# Patient Record
Sex: Male | Born: 1935 | Race: White | Hispanic: No | Smoking: Former smoker
Health system: Southern US, Community
[De-identification: ages and names within clinical notes are randomized; demographics above are authoritative.]

## PROBLEM LIST (undated history)

## (undated) DIAGNOSIS — K635 Polyp of colon: Secondary | ICD-10-CM

## (undated) DIAGNOSIS — K5520 Angiodysplasia of colon without hemorrhage: Secondary | ICD-10-CM

## (undated) DIAGNOSIS — I10 Essential (primary) hypertension: Secondary | ICD-10-CM

## (undated) DIAGNOSIS — M1990 Unspecified osteoarthritis, unspecified site: Secondary | ICD-10-CM

## (undated) DIAGNOSIS — E669 Obesity, unspecified: Secondary | ICD-10-CM

## (undated) DIAGNOSIS — G4730 Sleep apnea, unspecified: Secondary | ICD-10-CM

## (undated) DIAGNOSIS — G629 Polyneuropathy, unspecified: Secondary | ICD-10-CM

## (undated) DIAGNOSIS — H353290 Exudative age-related macular degeneration, unspecified eye, stage unspecified: Secondary | ICD-10-CM

## (undated) DIAGNOSIS — N400 Enlarged prostate without lower urinary tract symptoms: Secondary | ICD-10-CM

## (undated) DIAGNOSIS — K5730 Diverticulosis of large intestine without perforation or abscess without bleeding: Secondary | ICD-10-CM

## (undated) HISTORY — DX: Polyneuropathy, unspecified: G62.9

## (undated) HISTORY — PX: MASTOID DEBRIDEMENT: SHX2002

## (undated) HISTORY — DX: Obesity, unspecified: E66.9

## (undated) HISTORY — PX: APPENDECTOMY: SHX54

## (undated) HISTORY — DX: Benign prostatic hyperplasia without lower urinary tract symptoms: N40.0

## (undated) HISTORY — DX: Diverticulosis of large intestine without perforation or abscess without bleeding: K57.30

## (undated) HISTORY — DX: Polyp of colon: K63.5

## (undated) HISTORY — PX: TONSILLECTOMY: SUR1361

## (undated) HISTORY — DX: Angiodysplasia of colon without hemorrhage: K55.20

## (undated) HISTORY — DX: Essential (primary) hypertension: I10

## (undated) HISTORY — DX: Exudative age-related macular degeneration, unspecified eye, stage unspecified: H35.3290

## (undated) HISTORY — PX: KNEE ARTHROSCOPY: SUR90

---

## 1958-05-16 HISTORY — PX: PILONIDAL CYST EXCISION: SHX744

## 2002-07-26 ENCOUNTER — Ambulatory Visit (HOSPITAL_BASED_OUTPATIENT_CLINIC_OR_DEPARTMENT_OTHER): Admission: RE | Admit: 2002-07-26 | Discharge: 2002-07-26 | Payer: Self-pay | Admitting: Pulmonary Disease

## 2004-12-29 ENCOUNTER — Ambulatory Visit: Payer: Self-pay | Admitting: Internal Medicine

## 2005-03-07 ENCOUNTER — Ambulatory Visit: Payer: Self-pay | Admitting: Internal Medicine

## 2005-04-18 ENCOUNTER — Ambulatory Visit: Payer: Self-pay | Admitting: Internal Medicine

## 2005-05-02 ENCOUNTER — Ambulatory Visit (HOSPITAL_COMMUNITY): Admission: RE | Admit: 2005-05-02 | Discharge: 2005-05-02 | Payer: Self-pay | Admitting: Internal Medicine

## 2005-05-05 ENCOUNTER — Encounter: Payer: Self-pay | Admitting: Internal Medicine

## 2005-05-05 ENCOUNTER — Ambulatory Visit: Payer: Self-pay | Admitting: Internal Medicine

## 2005-12-29 ENCOUNTER — Ambulatory Visit: Payer: Self-pay | Admitting: Internal Medicine

## 2006-02-13 DIAGNOSIS — K5520 Angiodysplasia of colon without hemorrhage: Secondary | ICD-10-CM

## 2006-02-13 HISTORY — DX: Angiodysplasia of colon without hemorrhage: K55.20

## 2006-03-01 ENCOUNTER — Ambulatory Visit: Payer: Self-pay | Admitting: Gastroenterology

## 2006-03-15 ENCOUNTER — Ambulatory Visit: Payer: Self-pay | Admitting: Gastroenterology

## 2006-03-15 ENCOUNTER — Encounter (INDEPENDENT_AMBULATORY_CARE_PROVIDER_SITE_OTHER): Payer: Self-pay | Admitting: *Deleted

## 2006-06-27 ENCOUNTER — Ambulatory Visit: Payer: Self-pay | Admitting: Internal Medicine

## 2006-06-27 LAB — CONVERTED CEMR LAB
ALT: 29 units/L (ref 0–40)
AST: 28 units/L (ref 0–37)
Albumin: 3.7 g/dL (ref 3.5–5.2)
BUN: 13 mg/dL (ref 6–23)
Basophils Absolute: 0 10*3/uL (ref 0.0–0.1)
Bilirubin, Direct: 0.1 mg/dL (ref 0.0–0.3)
Calcium: 9.2 mg/dL (ref 8.4–10.5)
Chloride: 101 meq/L (ref 96–112)
Eosinophils Absolute: 0.2 10*3/uL (ref 0.0–0.6)
Eosinophils Relative: 2.5 % (ref 0.0–5.0)
GFR calc Af Amer: 123 mL/min
GFR calc non Af Amer: 102 mL/min
Glucose, Bld: 107 mg/dL — ABNORMAL HIGH (ref 70–99)
Lymphocytes Relative: 30.4 % (ref 12.0–46.0)
MCHC: 35.1 g/dL (ref 30.0–36.0)
MCV: 96.8 fL (ref 78.0–100.0)
Monocytes Relative: 9.8 % (ref 3.0–11.0)
Neutro Abs: 3.7 10*3/uL (ref 1.4–7.7)
Platelets: 182 10*3/uL (ref 150–400)
RBC: 4.53 M/uL (ref 4.22–5.81)
Vitamin B-12: 318 pg/mL (ref 211–911)
WBC: 6.4 10*3/uL (ref 4.5–10.5)

## 2006-12-13 DIAGNOSIS — K5730 Diverticulosis of large intestine without perforation or abscess without bleeding: Secondary | ICD-10-CM | POA: Insufficient documentation

## 2006-12-13 DIAGNOSIS — I10 Essential (primary) hypertension: Secondary | ICD-10-CM

## 2006-12-13 DIAGNOSIS — K5520 Angiodysplasia of colon without hemorrhage: Secondary | ICD-10-CM | POA: Insufficient documentation

## 2006-12-26 ENCOUNTER — Ambulatory Visit: Payer: Self-pay | Admitting: Internal Medicine

## 2006-12-27 LAB — CONVERTED CEMR LAB
Albumin: 3.9 g/dL (ref 3.5–5.2)
BUN: 13 mg/dL (ref 6–23)
CO2: 31 meq/L (ref 19–32)
Calcium: 9.2 mg/dL (ref 8.4–10.5)
Creatinine, Ser: 0.9 mg/dL (ref 0.4–1.5)
GFR calc non Af Amer: 89 mL/min
PSA: 0.99 ng/mL (ref 0.10–4.00)
Phosphorus: 2.6 mg/dL (ref 2.3–4.6)
Potassium: 4.3 meq/L (ref 3.5–5.1)

## 2007-05-28 ENCOUNTER — Encounter: Payer: Self-pay | Admitting: Internal Medicine

## 2007-06-22 ENCOUNTER — Ambulatory Visit: Payer: Self-pay | Admitting: Internal Medicine

## 2007-12-24 ENCOUNTER — Ambulatory Visit: Payer: Self-pay | Admitting: Internal Medicine

## 2007-12-24 DIAGNOSIS — N138 Other obstructive and reflux uropathy: Secondary | ICD-10-CM

## 2007-12-24 DIAGNOSIS — N401 Enlarged prostate with lower urinary tract symptoms: Secondary | ICD-10-CM

## 2007-12-25 LAB — CONVERTED CEMR LAB
ALT: 29 units/L (ref 0–53)
AST: 25 units/L (ref 0–37)
Alkaline Phosphatase: 50 units/L (ref 39–117)
BUN: 16 mg/dL (ref 6–23)
Bilirubin, Direct: 0.1 mg/dL (ref 0.0–0.3)
Calcium: 9.2 mg/dL (ref 8.4–10.5)
Creatinine, Ser: 0.9 mg/dL (ref 0.4–1.5)
Eosinophils Relative: 3.4 % (ref 0.0–5.0)
Glucose, Bld: 106 mg/dL — ABNORMAL HIGH (ref 70–99)
Lymphocytes Relative: 29.6 % (ref 12.0–46.0)
Monocytes Relative: 11.1 % (ref 3.0–12.0)
Phosphorus: 2.8 mg/dL (ref 2.3–4.6)
Platelets: 173 10*3/uL (ref 150–400)
RDW: 12.5 % (ref 11.5–14.6)
TSH: 2.56 microintl units/mL (ref 0.35–5.50)
Total Bilirubin: 0.8 mg/dL (ref 0.3–1.2)
Total Protein: 6.5 g/dL (ref 6.0–8.3)
WBC: 8 10*3/uL (ref 4.5–10.5)

## 2008-07-02 ENCOUNTER — Ambulatory Visit: Payer: Self-pay | Admitting: Internal Medicine

## 2008-07-07 LAB — CONVERTED CEMR LAB
BUN: 14 mg/dL (ref 6–23)
CO2: 33 meq/L — ABNORMAL HIGH (ref 19–32)
Chloride: 99 meq/L (ref 96–112)
Creatinine, Ser: 0.9 mg/dL (ref 0.4–1.5)
GFR calc Af Amer: 107 mL/min
Glucose, Bld: 104 mg/dL — ABNORMAL HIGH (ref 70–99)

## 2008-10-08 ENCOUNTER — Ambulatory Visit: Payer: Self-pay | Admitting: Family Medicine

## 2008-10-15 ENCOUNTER — Ambulatory Visit: Payer: Self-pay | Admitting: Internal Medicine

## 2009-01-23 ENCOUNTER — Ambulatory Visit: Payer: Self-pay | Admitting: Internal Medicine

## 2009-07-28 ENCOUNTER — Ambulatory Visit: Payer: Self-pay | Admitting: Internal Medicine

## 2009-07-29 LAB — CONVERTED CEMR LAB
ALT: 23 units/L (ref 0–53)
Alkaline Phosphatase: 56 units/L (ref 39–117)
Basophils Relative: 0.1 % (ref 0.0–3.0)
Bilirubin, Direct: 0.1 mg/dL (ref 0.0–0.3)
Calcium: 9.3 mg/dL (ref 8.4–10.5)
Chloride: 104 meq/L (ref 96–112)
Eosinophils Relative: 3.5 % (ref 0.0–5.0)
Glucose, Bld: 112 mg/dL — ABNORMAL HIGH (ref 70–99)
Lymphocytes Relative: 30.9 % (ref 12.0–46.0)
MCV: 101.3 fL — ABNORMAL HIGH (ref 78.0–100.0)
Neutrophils Relative %: 56 % (ref 43.0–77.0)
Potassium: 4.2 meq/L (ref 3.5–5.1)
RBC: 4.51 M/uL (ref 4.22–5.81)
Sodium: 144 meq/L (ref 135–145)
Total Protein: 6.4 g/dL (ref 6.0–8.3)
WBC: 7.1 10*3/uL (ref 4.5–10.5)

## 2009-07-31 ENCOUNTER — Encounter: Payer: Self-pay | Admitting: Internal Medicine

## 2009-08-10 ENCOUNTER — Ambulatory Visit: Payer: Self-pay | Admitting: Internal Medicine

## 2009-08-10 LAB — CONVERTED CEMR LAB
Nitrite: NEGATIVE
Urobilinogen, UA: 0.2

## 2010-02-02 ENCOUNTER — Ambulatory Visit: Payer: Self-pay | Admitting: Internal Medicine

## 2010-03-25 ENCOUNTER — Telehealth: Payer: Self-pay | Admitting: Internal Medicine

## 2010-04-01 ENCOUNTER — Ambulatory Visit: Payer: Self-pay | Admitting: Internal Medicine

## 2010-04-01 ENCOUNTER — Encounter: Payer: Self-pay | Admitting: Internal Medicine

## 2010-05-19 ENCOUNTER — Encounter: Payer: Self-pay | Admitting: Internal Medicine

## 2010-06-15 NOTE — Letter (Signed)
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

## 2010-06-15 NOTE — Progress Notes (Signed)
Summary: Want rx  Phone Note Call from Patient Call back at Home Phone [phone]   Caller: Patient Call For: Cindee Salt MD Summary of Call: pt states he has neuropathy in his feet and he was taking his wife's ibuprofen 800mg  and it helped, pt would like rx for this sent to [facility] in [place].  Initial call taken by: Mervin Hack CMA Duncan Dull),  March 25, 2010 11:14 AM  Follow-up for Phone Call        okay to send Rx for ibuprofen 800mg  three times a day as needed  #100 x 3  he can take 4 of the OTC 200mg  if he wants also Follow-up by: Cindee Salt MD,  March 25, 2010 1:33 PM  Additional Follow-up for Phone Call Additional follow up Details #1::        Called patient and left message on machine that rx was called in. Additional Follow-up by: Mervin Hack CMA Duncan Dull),  March 25, 2010 4:31 PM    New/Updated Medications: IBUPROFEN 800 MG TABS (IBUPROFEN) take 1 by mouth three times a day as needed Prescriptions: IBUPROFEN 800 MG TABS (IBUPROFEN) take 1 by mouth three times a day as needed  #100 x 3   Entered by:   Mervin Hack CMA (AAMA)   Authorized by:   Cindee Salt MD   Signed by:   Mervin Hack CMA (AAMA) on 03/25/2010   Method used:   Electronically to        [facility] [address] (retail)       [address] [place], [place]  [address]       Ph: [phone]       Fax: [phone]   RxID:   [id number]

## 2010-06-15 NOTE — Assessment & Plan Note (Signed)
Summary: pain in kidney/alc   Vital Signs:  Patient profile:   76 year old male Weight:      346 pounds Temp:     97.5 degrees F oral BP sitting:   130 / 90  (left arm) Cuff size:   large  Vitals Entered By: Mervin Hack CMA Duncan Dull) (August 10, 2009 11:51 AM) CC: pain in kidney   History of Present Illness: Noted some pain along left flank a week ago under the rib cage constant ache Some worsening with movement--still able to play golf  No known injury or lifting tasks  No fever No change in voiding patterns has had nocturia x 2 which had been rare No dysuria--till slight pain this AM in the tip of his penis No hematuria  No scrotal pain or swelling  Allergies: No Known Drug Allergies  Past History:  Past medical, surgical, family and social histories (including risk factors) reviewed for relevance to current acute and chronic problems.  Past Medical History: Reviewed history from 01/23/2009 and no changes required. Colonic polyps, hx of Diverticulosis, colon Hypertension Angiodysplasia--10/07 Neuropathy Benign prostatic hypertrophy Obesity  Past Surgical History: Reviewed history from 12/13/2006 and no changes required. Mastoiditis- child Appendectomy- child Tonsillectomy- child Pilonidal cyst  ~1960 Arthroscopy both knees  Family History: Reviewed history from 06/22/2007 and no changes required. Father: Died at age 40, old age Mother: Alive--possible cancer Siblings: One sister, obese, pacer No CAD ??HTN DM in Mat uncle, sister No prostate or colon cancer  Social History: Reviewed history from 12/24/2007 and no changes required. 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  NAD Neck:   supple, no masses, and no thyromegaly.   Chest Wall:  pain posteriorly around T9-10 Tenderness with palpation along ribs there Lungs:  normal respiratory effort, no intercostal retractions, no accessory muscle use, normal breath sounds, and no dullness.   Heart:  normal rate, regular rhythm, no murmur, and no gallop.   Abdomen:  soft and non-tender.   Genitalia:  no scrotal masses and no cutaneous lesions.   Extremities:  no edema Psych:  normally interactive, good eye contact, not anxious appearing, and not depressed appearing.     Impression & Recommendations:  Problem # 1:  CHEST WALL PAIN, ACUTE (ICD-786.52) Assessment New  around upper back No true CVA tenderness like it was his kidney and urine negative nothing to suggest a pleural process Abd bengin  likely is just a chest/back wall process despite no injury ??sprain  disucssed heat and supportive care  Orders: UA Dipstick w/o Micro (manual) (44034)  Complete Medication List: 1)  Klor-con 10 10 Meq Tbcr (Potassium chloride) .... Take one by mouth two times a day 2)  Triamterene-hctz 37.5-25 Mg Caps (Triamterene-hctz) .... Take one by mouth once a day 3)  Avapro 150 Mg Tabs (Irbesartan) .... Take one by mouth once a day 4)  Multivitamins Tabs (Multiple vitamin) .... Take one by mouth once a day 5)  Aspirin 81 Mg Tbec (Aspirin) .... Take one by mouth once a day 6)  Doxazosin Mesylate 4 Mg Tabs (Doxazosin mesylate) .Marland Kitchen.. 1 at bedtime 7)  Folic Acid 1 Mg Tabs (Folic acid) .... Take 1 by mouth once daily  Patient Instructions: 1)  Please schedule  a follow-up appointment as needed  and keep regularly scheduled appt  Current Allergies (reviewed today): No known allergies   Laboratory Results   Urine Tests  Date/Time Received: August 10, 2009 11:57 AM Date/Time Reported: August 10, 2009 11:57 AM  Routine Urinalysis   Color: yellow Appearance: Hazy Glucose: negative   (Normal Range: Negative) Bilirubin: negative    (Normal Range: Negative) Ketone: negative   (Normal Range: Negative) Spec. Gravity: 1.015   (Normal Range: 1.003-1.035) Blood: negative   (Normal Range: Negative) pH: 6.0   (Normal Range: 5.0-8.0) Protein: negative   (Normal Range: Negative) Urobilinogen: 0.2   (Normal Range: 0-1) Nitrite: negative   (Normal Range: Negative) Leukocyte Esterace: negative   (Normal Range: Negative)
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
## 2010-06-15 NOTE — Assessment & Plan Note (Signed)
Summary: 6 M F/U DLO   Vital Signs:  Patient profile:   76 year old male Height:      69 inches Weight:      348.38 pounds BMI:     51.63 Temp:     97.8 degrees F oral Pulse rate:   80 / minute Pulse rhythm:   regular BP sitting:   138 / 96  (left arm) Cuff size:   large  Vitals Entered By: Linde Gillis CMA Duncan Dull) (July 28, 2009 7:58 AM)  Serial Vital Signs/Assessments:  Time      Position  BP       Pulse  Resp  Temp     By           R Arm     140/90                         Cindee Salt MD  CC: FOLLOW UP   History of Present Illness: Doing fairly well Just down in [place] for a week for [employer] spring [employer]  Plans to start gardening Trying to wlak some Neuropathy still troubling--has to be careful on stairs and with balance  No apparent GI bleeding No black stool  Doesn't check BP No chest pain No SOB  Has had urinary urgency Occ loses a couple of drops if he waits too long No sig frequency No nocturia or seldom  Allergies (verified): No Known Drug Allergies  Past History:  Past medical, surgical, family and social histories (including risk factors) reviewed for relevance to current acute and chronic problems.  Past Medical History: Reviewed history from 01/23/2009 and no changes required. Colonic polyps, hx of Diverticulosis, colon Hypertension Angiodysplasia--10/07 Neuropathy Benign prostatic hypertrophy Obesity  Past Surgical History: Reviewed history from 12/13/2006 and no changes required. Mastoiditis- child Appendectomy- child Tonsillectomy- child Pilonidal cyst  ~1960 Arthroscopy both knees  Family History: Reviewed history from 06/22/2007 and no changes required. Father: Died at age 22, old age Mother: Alive--possible cancer Siblings: One sister, obese, pacer No CAD ??HTN DM in Mat uncle, sister No prostate or colon cancer  Social History: Reviewed history from 12/24/2007 and no changes required. 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   Neck:  supple, no masses, no thyromegaly, no carotid bruits, and no cervical lymphadenopathy.   Lungs:  normal respiratory effort and normal breath sounds.   Heart:  normal rate, regular rhythm, no murmur, and no gallop.   Extremities:  thick calves but without pitting Psych:  normally interactive, good eye contact, not anxious appearing, and not depressed appearing.     Impression & Recommendations:  Problem # 1:  HYPERTENSION (ICD-401.9) Assessment Unchanged  Generally okay discussed fitness no changes  His updated medication list for this problem includes:    Triamterene-hctz 37.5-25 Mg Caps (Triamterene-hctz) .Marland Kitchen... Take one by mouth once a day    Avapro 150 Mg Tabs (Irbesartan) .Marland Kitchen... Take one by mouth once a day    Doxazosin Mesylate 4 Mg Tabs (Doxazosin mesylate) .Marland Kitchen... 1 at bedtime  BP today: 138/96 Prior BP: 118/60 (01/23/2009)  Labs Reviewed: K+: 4.5 (07/02/2008) Creat: : 0.9 (07/02/2008)     Orders: TLB-Renal  Function Panel (80069-RENAL) TLB-CBC Platelet - w/Differential (85025-CBCD) TLB-Hepatic/Liver Function Pnl (80076-HEPATIC) TLB-TSH (Thyroid Stimulating Hormone) (84443-TSH) Venipuncture (54098)  Problem # 2:  BENIGN PROSTATIC HYPERTROPHY (ICD-600.00) Assessment: Unchanged voids okay on doxazosin  Problem # 3:  NEUROPATHY (ICD-355.9) Assessment: Unchanged mild  pain not a major issue  Problem # 4:  ANGIODYSPLASIA (JXB-147.82) Assessment: Comment Only no apparent bleeding will check CBC  Complete Medication List: 1)  Klor-con 10 10 Meq Tbcr (Potassium chloride) .... Take one by mouth two times a day 2)  Triamterene-hctz 37.5-25 Mg Caps (Triamterene-hctz)  .... Take one by mouth once a day 3)  Avapro 150 Mg Tabs (Irbesartan) .... Take one by mouth once a day 4)  Multivitamins Tabs (Multiple vitamin) .... Take one by mouth once a day 5)  Aspirin 81 Mg Tbec (Aspirin) .... Take one by mouth once a day 6)  Folic Acid  .... Take one by mouth once a day 7)  Doxazosin Mesylate 4 Mg Tabs (Doxazosin mesylate) .Marland Kitchen.. 1 at bedtime 8)  Bactroban 2 % Oint (Mupirocin) .... Apply to affected area two times a day  Patient Instructions: 1)  Please schedule a follow-up appointment in 6 months .   Current Allergies (reviewed today): No known allergies
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
## 2010-06-16 ENCOUNTER — Encounter: Payer: Self-pay | Admitting: Internal Medicine

## 2010-06-17 NOTE — Letter (Signed)
Summary: [facility] / MEDICAL RECORDS RELEASE FORM  [facility] / MEDICAL RECORDS RELEASE FORM   Imported By: Carin Primrose 05/19/2010 15:58:57  _____________________________________________________________________  External Attachment:    Type:   Image     Comment:   External Document

## 2010-08-03 ENCOUNTER — Encounter: Payer: Self-pay | Admitting: Internal Medicine

## 2010-08-03 ENCOUNTER — Telehealth: Payer: Self-pay | Admitting: *Deleted

## 2010-08-03 ENCOUNTER — Ambulatory Visit (INDEPENDENT_AMBULATORY_CARE_PROVIDER_SITE_OTHER): Payer: Medicare Other | Admitting: Internal Medicine

## 2010-08-03 VITALS — BP 130/80 | HR 86 | Temp 97.7°F | Wt 312.0 lb

## 2010-08-03 DIAGNOSIS — G589 Mononeuropathy, unspecified: Secondary | ICD-10-CM

## 2010-08-03 DIAGNOSIS — N400 Enlarged prostate without lower urinary tract symptoms: Secondary | ICD-10-CM

## 2010-08-03 DIAGNOSIS — E669 Obesity, unspecified: Secondary | ICD-10-CM

## 2010-08-03 DIAGNOSIS — I10 Essential (primary) hypertension: Secondary | ICD-10-CM

## 2010-08-03 LAB — HEPATIC FUNCTION PANEL
ALT: 45 U/L (ref 0–53)
Alkaline Phosphatase: 72 U/L (ref 39–117)
Bilirubin, Direct: 0.2 mg/dL (ref 0.0–0.3)
Total Protein: 6.3 g/dL (ref 6.0–8.3)

## 2010-08-03 LAB — BASIC METABOLIC PANEL
BUN: 14 mg/dL (ref 6–23)
Chloride: 104 mEq/L (ref 96–112)
Creatinine, Ser: 0.9 mg/dL (ref 0.4–1.5)
GFR: 85.53 mL/min (ref >60.00)

## 2010-08-03 NOTE — Assessment & Plan Note (Signed)
Voiding better since weight loss Continues on doxazosin Has to be careful with alcohol in evening

## 2010-08-03 NOTE — Assessment & Plan Note (Signed)
Pain is not an issue Ongoing sensory problems but no Rx needed

## 2010-08-03 NOTE — Progress Notes (Signed)
Addended byMills Koller on: 08/03/2010 10:41 AM   Modules accepted: Orders

## 2010-08-03 NOTE — Telephone Encounter (Signed)
Called patient and left message with results, advised pt to call if any questions.

## 2010-08-03 NOTE — Assessment & Plan Note (Signed)
Much improved with the specialized [facility] program Hopes to continue lifestyle changes

## 2010-08-03 NOTE — Progress Notes (Signed)
Subjective:    Patient ID: Brad Reed, male    DOB: [birth year], 76 y.o.   MRN: [id number]  Hypertension This is a chronic problem. The current episode started more than 1 year ago. The problem has been gradually improving since onset. The problem is controlled. Pertinent negatives include no chest pain, headaches, palpitations, peripheral edema or shortness of breath. There are no associated agents to hypertension. Risk factors for coronary artery disease include obesity. Past treatments include angiotensin blockers. The current treatment provides mild improvement. Compliance problems include exercise.  There is no history of angina, kidney disease, CAD/MI, CVA or heart failure.  Benign Prostatic Hypertrophy This is a chronic problem. The current episode started more than 1 year ago. The problem has been gradually improving since onset. Irritative symptoms include nocturia. Irritative symptoms do not include frequency or urgency. Obstructive symptoms do not include a weak stream. Pertinent negatives include no hematuria. Past treatments include doxazosin. The treatment provided mild relief. He has been using treatment for 2 or more years.   Went to expensive month long [facility] weight loss program. Has lost about 40#. Now moderating eating some--may be up 200-300 calories a day from nadir. Careful with salt and trans-fats. Is using internet based calorie counter on his smart phone Never much for exercise but has noted sig improvement in all facets of life. Stamina is better, less problem with voiding  Neuropathy may be slightly worse. Occ jab of pain but not overly severe. More sensory loss. No recent falls though and is not unstable in general.  Past Medical History  Diagnosis Date  . Colon polyps   . Diverticulosis of colon   . Hypertension   . Angiodysplasia 02/13/2006  . Neuropathy   . Benign prostatic hypertrophy   . Obesity   . 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 Mastoid debridement   . Appendectomy   . Tonsillectomy   . Pilonidal cyst excision 1960  . Knee arthroscopy     BOTH KNEES    Family History  Problem Relation Age of Onset  . Cancer Mother   . Obesity Sister   . Diabetes Sister   . Diabetes Maternal Uncle     History   Social History  . Marital Status: Widowed    Spouse Name: N/A    Number of Children: 3  . Years of Education: N/A   Occupational History  . 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 Smoking status: Former Smoker    Types: Cigarettes    Quit date: 05/17/1983  . Smokeless tobacco: Not on file  . Alcohol Use: Yes  . Drug Use: Not on file  . Sexually Active: Not on file   Other Topics Concern  . Not on file   Social History Narrative  . No narrative on file     Review of Systems  HENT: Negative for dental problem.        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   Cardiovascular: Negative for chest pain and palpitations.  Gastrointestinal: Negative for abdominal pain and blood in stool.  Genitourinary: Positive for nocturia. Negative for urgency, frequency and hematuria.  Musculoskeletal: Negative for joint swelling.       Chronic knee pain---seems better also  Neurological: Positive for numbness. Negative for syncope and headaches.   Just spent a month in [place] for spring training Has decided to stick with his house for now     Objective:   Physical Exam  Constitutional: He appears well-developed. No distress.  Neck: Neck supple. No thyromegaly present.  Cardiovascular: Normal rate, regular rhythm and normal heart sounds.   Pulmonary/Chest: Effort normal and breath sounds normal. He has no wheezes. He has no rales.  Abdominal:  Soft. He exhibits no mass. There is no tenderness.  Musculoskeletal: Normal range of motion. He exhibits no edema.       No joint swelling  Lymphadenopathy:    He has no cervical adenopathy.  Skin: No rash noted. No erythema.          Assessment & Plan:

## 2010-08-03 NOTE — Telephone Encounter (Signed)
Message copied by Mervin Hack on Tue Aug 03, 2010  3:48 PM ------      Message from: Tillman Abide      Created: Tue Aug 03, 2010  1:19 PM       Labs look fine      Blood sugar is only borderline elevated at 102--this is the best we have had here in over 4 years      Liver, kidney and thyroid are all normal

## 2010-08-03 NOTE — Assessment & Plan Note (Signed)
BP is better since weight loss Wonders about stopping one of his meds Discussed holding off on that to see how he does with lifestyle measures

## 2010-08-04 ENCOUNTER — Other Ambulatory Visit: Payer: Self-pay | Admitting: Internal Medicine

## 2010-08-04 MED ORDER — IRBESARTAN 150 MG PO TABS: 150.0000 mg | ORAL_TABLET | Freq: Every day | ORAL | Status: DC

## 2010-10-06 ENCOUNTER — Other Ambulatory Visit: Payer: Self-pay | Admitting: *Deleted

## 2010-10-06 MED ORDER — POTASSIUM CHLORIDE 10 MEQ PO TBCR: 10.0000 meq | EXTENDED_RELEASE_TABLET | Freq: Two times a day (BID) | ORAL | Status: DC

## 2010-10-07 ENCOUNTER — Other Ambulatory Visit: Payer: Self-pay | Admitting: *Deleted

## 2010-10-07 MED ORDER — DOXAZOSIN MESYLATE 4 MG PO TABS: 4.0000 mg | ORAL_TABLET | Freq: Every day | ORAL | Status: DC

## 2011-01-21 ENCOUNTER — Other Ambulatory Visit: Payer: Self-pay | Admitting: *Deleted

## 2011-01-21 ENCOUNTER — Other Ambulatory Visit: Payer: Self-pay | Admitting: Internal Medicine

## 2011-01-21 MED ORDER — IRBESARTAN 150 MG PO TABS: 150.0000 mg | ORAL_TABLET | Freq: Every day | ORAL | Status: DC

## 2011-02-04 ENCOUNTER — Ambulatory Visit: Payer: PRIVATE HEALTH INSURANCE | Admitting: Internal Medicine

## 2011-02-11 ENCOUNTER — Encounter: Payer: Self-pay | Admitting: Internal Medicine

## 2011-02-11 ENCOUNTER — Ambulatory Visit (INDEPENDENT_AMBULATORY_CARE_PROVIDER_SITE_OTHER): Payer: Medicare Other | Admitting: Internal Medicine

## 2011-02-11 VITALS — BP 111/59 | HR 83 | Temp 98.0°F | Ht 69.0 in | Wt 312.5 lb

## 2011-02-11 DIAGNOSIS — N400 Enlarged prostate without lower urinary tract symptoms: Secondary | ICD-10-CM

## 2011-02-11 DIAGNOSIS — Z23 Encounter for immunization: Secondary | ICD-10-CM

## 2011-02-11 DIAGNOSIS — G589 Mononeuropathy, unspecified: Secondary | ICD-10-CM

## 2011-02-11 DIAGNOSIS — E669 Obesity, unspecified: Secondary | ICD-10-CM

## 2011-02-11 DIAGNOSIS — I10 Essential (primary) hypertension: Secondary | ICD-10-CM

## 2011-02-11 NOTE — Assessment & Plan Note (Signed)
Still with sig numbness but not much pain No Rx needed

## 2011-02-11 NOTE — Assessment & Plan Note (Signed)
BP Readings from Last 3 Encounters:  02/11/11 111/59  08/03/10 130/80  04/01/10 128/80   Has been consistently low Some orthostasis Will stop the avapro

## 2011-02-11 NOTE — Assessment & Plan Note (Signed)
Voids okay Doxazosin may be keeping BP low as well---will continue

## 2011-02-11 NOTE — Progress Notes (Signed)
Subjective:    Patient ID: Brad Reed, male    DOB: September 04, 1934, 76 y.o.   MRN: [id number]  HPI Doing great Has kept all his weight off from intensive program Now able to play more golf Does note some lightheadness--esp when bending down to pick up ball (in the heat) BP at home 115-188/65-70 Stopped the BP med for 3 days to see how he would do  No chest pain No SOB No edema anymore No headaches  Voids fine occ doesn't even have nocturia No sig problems with urgency or freq  Neuropathy is the same Not much pain but sig numbness  Current Outpatient Prescriptions on File Prior to Visit  Medication Sig Dispense Refill  . aspirin 81 MG tablet Take 81 mg by mouth daily.        Marland Kitchen doxazosin (CARDURA) 4 MG tablet Take 1 tablet (4 mg total) by mouth at bedtime.  90 tablet  1  . ibuprofen (ADVIL,MOTRIN) 800 MG tablet Take 800 mg by mouth 3 (three) times daily as needed.        . irbesartan (AVAPRO) 150 MG tablet Take 1 tablet (150 mg total) by mouth at bedtime.  30 tablet  2  . Multiple Vitamin (MULTIVITAMIN) capsule Take 1 capsule by mouth daily.        . potassium chloride (K-DUR) 10 MEQ tablet TAKE ONE TABLET BY MOUTH TWO TIMES A DAY  180 tablet  0  . triamterene-hydrochlorothiazide (DYAZIDE) 37.5-25 MG per capsule Take 1 capsule by mouth every morning.          No Known Allergies  Past Medical History  Diagnosis Date  . Colon polyps   . Diverticulosis of colon   . Hypertension   . Angiodysplasia 02/13/2006  . Neuropathy   . Benign prostatic hypertrophy   . Obesity   . 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 Mastoid debridement   . Appendectomy   . Tonsillectomy   . Pilonidal cyst excision 1960  . Knee arthroscopy     BOTH KNEES    Family History  Problem Relation Age of Onset  . Cancer Mother   . Obesity Sister   . Diabetes Sister   . Diabetes Maternal Uncle     History   Social History  . Marital Status:  Widowed    Spouse Name: N/A    Number of Children: 3  . Years of Education: N/A   Occupational History  . 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 Smoking status: Former Smoker    Types: Cigarettes    Quit date: 05/17/1983  . Smokeless tobacco: Not on file  . Alcohol Use: Yes  . Drug Use: Not on file  . Sexually Active: Not on file   Other Topics Concern  . Not on file   Social History Narrative  . No narrative on file   Review of Systems Careful with eating---appetite is fine Weight stable Sleeps well     Objective:   Physical Exam  Constitutional: He appears well-developed and well-nourished. No distress.  Neck: Normal range of motion. Neck supple. No thyromegaly present.  Cardiovascular: Normal rate, normal heart sounds and intact distal pulses.  Exam reveals no gallop.   No murmur heard. Pulmonary/Chest: Effort normal. No respiratory distress. He has no wheezes. He  has no rales.  Musculoskeletal: He exhibits no edema and no tenderness.  Lymphadenopathy:    He has no cervical adenopathy.  Psychiatric: He has a normal mood and affect. His behavior is normal. Judgment and thought content normal.          Assessment & Plan:

## 2011-02-11 NOTE — Assessment & Plan Note (Signed)
Has kept weight off from intense [facility] program a year ago

## 2011-03-02 ENCOUNTER — Other Ambulatory Visit: Payer: Self-pay | Admitting: *Deleted

## 2011-03-02 MED ORDER — TRIAMTERENE-HCTZ 37.5-25 MG PO CAPS: 1.0000 | ORAL_CAPSULE | ORAL | Status: DC

## 2011-03-25 ENCOUNTER — Encounter: Payer: Self-pay | Admitting: Gastroenterology

## 2011-04-28 ENCOUNTER — Other Ambulatory Visit: Payer: Self-pay | Admitting: Internal Medicine

## 2011-06-13 DIAGNOSIS — H353290 Exudative age-related macular degeneration, unspecified eye, stage unspecified: Secondary | ICD-10-CM | POA: Diagnosis not present

## 2011-07-31 ENCOUNTER — Other Ambulatory Visit: Payer: Self-pay | Admitting: Internal Medicine

## 2011-08-01 DIAGNOSIS — H353290 Exudative age-related macular degeneration, unspecified eye, stage unspecified: Secondary | ICD-10-CM | POA: Diagnosis not present

## 2011-08-10 DIAGNOSIS — B351 Tinea unguium: Secondary | ICD-10-CM | POA: Diagnosis not present

## 2011-08-10 DIAGNOSIS — M79609 Pain in unspecified limb: Secondary | ICD-10-CM | POA: Diagnosis not present

## 2011-08-16 ENCOUNTER — Ambulatory Visit (INDEPENDENT_AMBULATORY_CARE_PROVIDER_SITE_OTHER): Payer: Medicare Other | Admitting: Internal Medicine

## 2011-08-16 ENCOUNTER — Encounter: Payer: Self-pay | Admitting: *Deleted

## 2011-08-16 ENCOUNTER — Encounter: Payer: Self-pay | Admitting: Internal Medicine

## 2011-08-16 VITALS — BP 140/90 | HR 81 | Temp 98.5°F | Ht 68.0 in | Wt 334.0 lb

## 2011-08-16 DIAGNOSIS — B354 Tinea corporis: Secondary | ICD-10-CM

## 2011-08-16 DIAGNOSIS — I10 Essential (primary) hypertension: Secondary | ICD-10-CM | POA: Diagnosis not present

## 2011-08-16 DIAGNOSIS — Z0000 Encounter for general adult medical examination without abnormal findings: Secondary | ICD-10-CM | POA: Insufficient documentation

## 2011-08-16 DIAGNOSIS — H353290 Exudative age-related macular degeneration, unspecified eye, stage unspecified: Secondary | ICD-10-CM | POA: Insufficient documentation

## 2011-08-16 DIAGNOSIS — N400 Enlarged prostate without lower urinary tract symptoms: Secondary | ICD-10-CM

## 2011-08-16 DIAGNOSIS — G589 Mononeuropathy, unspecified: Secondary | ICD-10-CM

## 2011-08-16 LAB — BASIC METABOLIC PANEL
BUN: 17 mg/dL (ref 6–23)
Chloride: 100 mEq/L (ref 96–112)
Potassium: 4.6 mEq/L (ref 3.5–5.1)
Sodium: 141 mEq/L (ref 135–145)

## 2011-08-16 LAB — HEPATIC FUNCTION PANEL
ALT: 23 U/L (ref 0–53)
AST: 26 U/L (ref 0–37)
Albumin: 4 g/dL (ref 3.5–5.2)
Alkaline Phosphatase: 50 U/L (ref 39–117)
Total Protein: 6.7 g/dL (ref 6.0–8.3)

## 2011-08-16 LAB — CBC WITH DIFFERENTIAL/PLATELET
Basophils Relative: 0.5 % (ref 0.0–3.0)
Eosinophils Relative: 2.1 % (ref 0.0–5.0)
HCT: 44.5 % (ref 39.0–52.0)
Hemoglobin: 15 g/dL (ref 13.0–17.0)
Lymphs Abs: 2.3 10*3/uL (ref 0.7–4.0)
MCV: 99.4 fl (ref 78.0–100.0)
Monocytes Absolute: 0.5 10*3/uL (ref 0.1–1.0)
Neutro Abs: 3.8 10*3/uL (ref 1.4–7.7)
Platelets: 150 10*3/uL (ref 150.0–400.0)
WBC: 6.9 10*3/uL (ref 4.5–10.5)

## 2011-08-16 LAB — LIPID PANEL
Cholesterol: 181 mg/dL (ref 0–200)
LDL Cholesterol: 105 mg/dL — ABNORMAL HIGH (ref 0–99)

## 2011-08-16 MED ORDER — KETOCONAZOLE 2 % EX CREA: TOPICAL_CREAM | Freq: Every day | CUTANEOUS | Status: AC

## 2011-08-16 NOTE — Assessment & Plan Note (Signed)
Mostly numbness Usually no problems with balance (unless going down steep stairs)

## 2011-08-16 NOTE — Assessment & Plan Note (Signed)
Unusual at his age but fairly classic Will try ketoconazole topically Fluconazole orally if fails

## 2011-08-16 NOTE — Assessment & Plan Note (Signed)
Voids okay Mild symptoms at present

## 2011-08-16 NOTE — Patient Instructions (Signed)
Please consider the shingles vaccine (zostavax). Call if you would like [place] to send an order to your pharmacy.

## 2011-08-16 NOTE — Assessment & Plan Note (Signed)
BP Readings from Last 3 Encounters:  08/16/11 140/90  02/11/11 111/59  08/03/10 130/80   Acceptable control still Due for labs

## 2011-08-16 NOTE — Assessment & Plan Note (Signed)
I have personally reviewed the Medicare Annual Wellness questionnaire and have noted 1. The patient's medical and social history 2. Their use of alcohol, tobacco or illicit drugs 3. Their current medications and supplements 4. The patient's functional ability including ADL's, fall risks, home safety risks and hearing or visual             impairment. 5. Diet and physical activities 6. Evidence for depression or mood disorders  The patients weight, height, BMI and visual acuity have been recorded in the chart I have made referrals, counseling and provided education to the patient based review of the above and I have provided the pt with a written personalized care plan for preventive services.  I have provided you with a copy of your personalized plan for preventive services. Please take the time to review along with your updated medication list.  See scanned note Will do zostavax if he requests Discussed fitness

## 2011-08-16 NOTE — Progress Notes (Signed)
Subjective:    Patient ID: Brad Reed, male    DOB: 05-13-1935, 77 y.o.   MRN: [id number]  HPI Here for wellness visit Questionnaire reviewed UTD on cancer screening (no PSA) Advanced directives reviewed  No depression or anhedonia No falls and feels stable Not really exercising over the winter---has been out some with improvement in weather  Chronic bad hearing since mastoid operation as child on left Does well except in noisy room No change over the years  Vision is okay Ongoing injection Rx for wet macular degeneration  No chest pain No SOB No palpitations No edema  Still has neuropathy pain in feet--mostly if cold Ongoing numbness L>R Does note some balance issues---like if going down stairs at baseball stadium  Has gained back a lot of his lost weight Working on lifestyle  Voiding okay Nocturia only occ Does have some mild urgency during the day  Current Outpatient Prescriptions on File Prior to Visit  Medication Sig Dispense Refill  . aspirin 81 MG tablet Take 81 mg by mouth daily.        Marland Kitchen doxazosin (CARDURA) 4 MG tablet TAKE 1 TABLET (4 MG TOTAL) BY MOUTH AT BEDTIME.  90 tablet  0  . potassium chloride (K-DUR) 10 MEQ tablet TAKE ONE TABLET BY MOUTH TWO TIMES A DAY  180 tablet  0  . triamterene-hydrochlorothiazide (DYAZIDE) 37.5-25 MG per capsule Take 1 each (1 capsule total) by mouth every morning.  90 capsule  3    No Known Allergies  Past Medical History  Diagnosis Date  . Colon polyps   . Diverticulosis of colon   . Hypertension   . Angiodysplasia 02/13/2006  . Neuropathy   . Benign prostatic hypertrophy   . Obesity   . 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 Macular degeneration, wet     Dr Monte Fantasia    Past Surgical History  Procedure Date  . Mastoid debridement   . Appendectomy   . Tonsillectomy   . Pilonidal cyst excision 1960  . Knee arthroscopy     BOTH KNEES    Family History  Problem Relation Age of Onset  . Cancer  Mother   . Obesity Sister   . Diabetes Sister   . Diabetes Maternal Uncle     History   Social History  . Marital Status: Widowed    Spouse Name: N/A    Number of Children: 3  . Years of Education: N/A   Occupational History  . 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 Smoking status: Former Smoker    Types: Cigarettes    Quit date: 05/17/1983  . Smokeless tobacco: Never Used  . Alcohol Use: Yes  . Drug Use: Not on file  . Sexually Active: Not on file   Other Topics Concern  . Not on file   Social History Narrative   Has living will Son Brad Reed is health care POAWould accept resuscitation but no prolonged artificial life supportNo tube feeds if cognitively unaware   Review of Systems Has ring like rash for 6-7 weeks on right calf---now one on left buttock as well Sleeps well    Objective:   Physical Exam  Constitutional: He is oriented to person, place, and time. He appears well-developed and well-nourished. No distress.  Neck: Normal range of motion. Neck supple. No thyromegaly present.  Cardiovascular: Normal  rate, regular rhythm, normal heart sounds and intact distal pulses.  Exam reveals no gallop.   No murmur heard. Pulmonary/Chest: Effort normal and breath sounds normal. No respiratory distress. He has no wheezes. He has no rales.  Abdominal: Soft. There is no tenderness.  Musculoskeletal: He exhibits no edema and no tenderness.       Thick calves without pitting  Lymphadenopathy:    He has no cervical adenopathy.  Neurological: He is alert and oriented to person, place, and time.       President--- "Brad Reed, Brad Reed" [phone] D-l-r-o-w Recall 3/3  Skin:       Apparent ringworm on leg/buttock  Psychiatric: He has a normal mood and affect. His behavior is normal. Judgment and thought content normal.          Assessment & Plan:

## 2011-09-12 DIAGNOSIS — H353290 Exudative age-related macular degeneration, unspecified eye, stage unspecified: Secondary | ICD-10-CM | POA: Diagnosis not present

## 2011-10-17 DIAGNOSIS — H353290 Exudative age-related macular degeneration, unspecified eye, stage unspecified: Secondary | ICD-10-CM | POA: Diagnosis not present

## 2011-11-03 ENCOUNTER — Other Ambulatory Visit: Payer: Self-pay | Admitting: Internal Medicine

## 2011-11-14 DIAGNOSIS — M79609 Pain in unspecified limb: Secondary | ICD-10-CM | POA: Diagnosis not present

## 2011-11-14 DIAGNOSIS — B351 Tinea unguium: Secondary | ICD-10-CM | POA: Diagnosis not present

## 2011-11-14 DIAGNOSIS — H353290 Exudative age-related macular degeneration, unspecified eye, stage unspecified: Secondary | ICD-10-CM | POA: Diagnosis not present

## 2011-12-15 DIAGNOSIS — H52229 Regular astigmatism, unspecified eye: Secondary | ICD-10-CM | POA: Diagnosis not present

## 2011-12-15 DIAGNOSIS — H5200 Hypermetropia, unspecified eye: Secondary | ICD-10-CM | POA: Diagnosis not present

## 2011-12-15 DIAGNOSIS — H353190 Nonexudative age-related macular degeneration, unspecified eye, stage unspecified: Secondary | ICD-10-CM | POA: Diagnosis not present

## 2011-12-15 DIAGNOSIS — H524 Presbyopia: Secondary | ICD-10-CM | POA: Diagnosis not present

## 2012-01-02 DIAGNOSIS — H353290 Exudative age-related macular degeneration, unspecified eye, stage unspecified: Secondary | ICD-10-CM | POA: Diagnosis not present

## 2012-01-17 ENCOUNTER — Other Ambulatory Visit: Payer: Self-pay | Admitting: Internal Medicine

## 2012-02-10 DIAGNOSIS — H353290 Exudative age-related macular degeneration, unspecified eye, stage unspecified: Secondary | ICD-10-CM | POA: Diagnosis not present

## 2012-02-14 DIAGNOSIS — M79609 Pain in unspecified limb: Secondary | ICD-10-CM | POA: Diagnosis not present

## 2012-02-14 DIAGNOSIS — B351 Tinea unguium: Secondary | ICD-10-CM | POA: Diagnosis not present

## 2012-02-15 ENCOUNTER — Encounter: Payer: Self-pay | Admitting: Gastroenterology

## 2012-04-11 DIAGNOSIS — H353290 Exudative age-related macular degeneration, unspecified eye, stage unspecified: Secondary | ICD-10-CM | POA: Diagnosis not present

## 2012-05-21 DIAGNOSIS — B351 Tinea unguium: Secondary | ICD-10-CM | POA: Diagnosis not present

## 2012-05-21 DIAGNOSIS — M79609 Pain in unspecified limb: Secondary | ICD-10-CM | POA: Diagnosis not present

## 2012-06-01 DIAGNOSIS — H353290 Exudative age-related macular degeneration, unspecified eye, stage unspecified: Secondary | ICD-10-CM | POA: Diagnosis not present

## 2012-07-23 DIAGNOSIS — H353290 Exudative age-related macular degeneration, unspecified eye, stage unspecified: Secondary | ICD-10-CM | POA: Diagnosis not present

## 2012-08-14 ENCOUNTER — Other Ambulatory Visit (INDEPENDENT_AMBULATORY_CARE_PROVIDER_SITE_OTHER): Payer: Medicare Other

## 2012-08-14 DIAGNOSIS — I10 Essential (primary) hypertension: Secondary | ICD-10-CM

## 2012-08-14 LAB — CBC WITH DIFFERENTIAL/PLATELET
Basophils Absolute: 0 10*3/uL (ref 0.0–0.1)
Eosinophils Relative: 2.8 % (ref 0.0–5.0)
Monocytes Relative: 9.6 % (ref 3.0–12.0)
Neutrophils Relative %: 56.6 % (ref 43.0–77.0)
Platelets: 149 10*3/uL — ABNORMAL LOW (ref 150.0–400.0)
RDW: 13.4 % (ref 11.5–14.6)
WBC: 5.9 10*3/uL (ref 4.5–10.5)

## 2012-08-14 LAB — TSH: TSH: 4.1 u[IU]/mL (ref 0.35–5.50)

## 2012-08-14 LAB — COMPREHENSIVE METABOLIC PANEL
ALT: 22 U/L (ref 0–53)
Albumin: 3.7 g/dL (ref 3.5–5.2)
Alkaline Phosphatase: 49 U/L (ref 39–117)
CO2: 31 mEq/L (ref 19–32)
GFR: 86.14 mL/min (ref >60.00)
Glucose, Bld: 119 mg/dL — ABNORMAL HIGH (ref 70–99)
Potassium: 4.3 mEq/L (ref 3.5–5.1)
Sodium: 138 mEq/L (ref 135–145)
Total Protein: 6.2 g/dL (ref 6.0–8.3)

## 2012-08-14 LAB — LIPID PANEL
HDL: 44.2 mg/dL (ref >39.00)
Total CHOL/HDL Ratio: 4
VLDL: 23.2 mg/dL (ref 0.0–40.0)

## 2012-08-17 ENCOUNTER — Encounter: Payer: Self-pay | Admitting: Internal Medicine

## 2012-08-17 ENCOUNTER — Ambulatory Visit (INDEPENDENT_AMBULATORY_CARE_PROVIDER_SITE_OTHER): Payer: Medicare Other | Admitting: Internal Medicine

## 2012-08-17 VITALS — BP 140/80 | HR 84 | Temp 97.9°F | Ht 68.0 in | Wt 336.0 lb

## 2012-08-17 DIAGNOSIS — Z1331 Encounter for screening for depression: Secondary | ICD-10-CM

## 2012-08-17 DIAGNOSIS — E669 Obesity, unspecified: Secondary | ICD-10-CM

## 2012-08-17 DIAGNOSIS — G589 Mononeuropathy, unspecified: Secondary | ICD-10-CM | POA: Diagnosis not present

## 2012-08-17 DIAGNOSIS — I10 Essential (primary) hypertension: Secondary | ICD-10-CM

## 2012-08-17 DIAGNOSIS — Z0000 Encounter for general adult medical examination without abnormal findings: Secondary | ICD-10-CM

## 2012-08-17 DIAGNOSIS — N400 Enlarged prostate without lower urinary tract symptoms: Secondary | ICD-10-CM | POA: Diagnosis not present

## 2012-08-17 MED ORDER — FINASTERIDE 5 MG PO TABS: 5.0000 mg | ORAL_TABLET | Freq: Every day | ORAL | Status: DC

## 2012-08-17 NOTE — Patient Instructions (Signed)

## 2012-08-17 NOTE — Assessment & Plan Note (Signed)
BP Readings from Last 3 Encounters:  08/17/12 140/80  08/16/11 140/90  02/11/11 111/59   Acceptable control No changes needed

## 2012-08-17 NOTE — Assessment & Plan Note (Signed)
I have personally reviewed the Medicare Annual Wellness questionnaire and have noted 1. The patient's medical and social history 2. Their use of alcohol, tobacco or illicit drugs 3. Their current medications and supplements 4. The patient's functional ability including ADL's, fall risks, home safety risks and hearing or visual             impairment. 5. Diet and physical activities 6. Evidence for depression or mood disorders  The patients weight, height, BMI and visual acuity have been recorded in the chart I have made referrals, counseling and provided education to the patient based review of the above and I have provided the pt with a written personalized care plan for preventive services.  I have provided you with a copy of your personalized plan for preventive services. Please take the time to review along with your updated medication list.  Discussed fitness Doesn't want zostavax UTD otherwise Info on DASH diet given

## 2012-08-17 NOTE — Assessment & Plan Note (Signed)
Lifelong issue He will try to work on eating more

## 2012-08-17 NOTE — Progress Notes (Signed)
Subjective:    Patient ID: Brad Reed, male    DOB: 1935/02/09, 78 y.o.   MRN: [id number]  HPI Here for Medicare wellness exam Ongoing Rx for his macular degeneration Does drink alcohol -- 2 drinks daily Quit smoking 30 years ago No falls No depression or anhedonia---excited about baseball season Independent in ADLs and instrumental ADLs Vision is still good. Lifelong poor hearing on left No memory problems  Still gets some urgency to void--uses depends for leakage Frustrated with this Nocturia is variable  No chest pain No SOB Only exercise is golf---rides cart No dizziness or syncope  Neuropathy is still bothersome Mostly in feet Balance problems if on uneven terrain or on stairs--doesn't use any aide Not enough pain to consider Rx  Current Outpatient Prescriptions on File Prior to Visit  Medication Sig Dispense Refill  . aspirin 81 MG tablet Take 81 mg by mouth daily.        Marland Kitchen doxazosin (CARDURA) 4 MG tablet TAKE 1 TABLET (4 MG TOTAL) BY MOUTH AT BEDTIME.  90 tablet  3  . Multiple Vitamins-Minerals (CENTRUM SILVER ULTRA MENS) TABS Take 1 tablet by mouth daily.      . naproxen sodium (ANAPROX) 220 MG tablet Take 220 mg by mouth as needed.      . potassium chloride (K-DUR) 10 MEQ tablet TAKE 1 TABLET (10 MEQ TOTAL) BY MOUTH 2 (TWO) TIMES DAILY.  180 tablet  3  . triamterene-hydrochlorothiazide (DYAZIDE) 37.5-25 MG per capsule TAKE 1 EACH (1 CAPSULE TOTAL) BY MOUTH EVERY MORNING.  90 capsule  2   No current facility-administered medications on file prior to visit.    No Known Allergies  Past Medical History  Diagnosis Date  . Colon polyps   . Diverticulosis of colon   . Hypertension   . Angiodysplasia 02/13/2006  . Neuropathy   . Benign prostatic hypertrophy   . Obesity   . 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 Macular degeneration, wet     Dr Monte Fantasia    Past Surgical History  Procedure Laterality Date  . Mastoid debridement    . Appendectomy     . Tonsillectomy    . Pilonidal cyst excision  1960  . Knee arthroscopy      BOTH KNEES    Family History  Problem Relation Age of Onset  . Cancer Mother   . Obesity Sister   . Diabetes Sister   . Diabetes Maternal Uncle     History   Social History  . Marital Status: Widowed    Spouse Name: N/A    Number of Children: 3  . Years of Education: N/A   Occupational History  . 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 Smoking status: Former Smoker    Types: Cigarettes    Quit date: 05/17/1983  . Smokeless tobacco: Never Used  . Alcohol Use: Yes  . Drug Use: Not on file  . Sexually Active: Not on file   Other Topics Concern  . Not on file   Social History Narrative   Has living will    Son Brad Reed is health care POA   Would accept resuscitation but no prolonged artificial life support   No tube feeds if cognitively unaware   Review of Systems Sold house---now lives in apartment Bowels okay Sleeps well Weight is stable  Objective:   Physical Exam  Constitutional: He is oriented to person, place, and time. He appears well-developed and well-nourished. No distress.  Neck: Normal range of motion. Neck supple. No thyromegaly present.  Cardiovascular: Normal rate, regular rhythm, normal heart sounds and intact distal pulses.  Exam reveals no gallop.   No murmur heard. Pulmonary/Chest: Effort normal and breath sounds normal. No respiratory distress. He has no wheezes. He has no rales.  Abdominal: Soft. There is no tenderness.  Musculoskeletal: He exhibits no edema and no tenderness.  Lymphadenopathy:    He has no cervical adenopathy.  Neurological: He is alert and oriented to person, place, and time.  President-- "Obama, Bush, Clinton" [phone] D-l-r-o-w Recall 2/3  Psychiatric: He has a normal mood and affect. His behavior is normal.           Assessment & Plan:

## 2012-08-17 NOTE — Assessment & Plan Note (Signed)
Pain not a big issue Discussed using walking stick if balance worsens

## 2012-08-17 NOTE — Assessment & Plan Note (Signed)
Increased symptoms Will try finasteride

## 2012-08-20 DIAGNOSIS — M79609 Pain in unspecified limb: Secondary | ICD-10-CM | POA: Diagnosis not present

## 2012-08-20 DIAGNOSIS — B351 Tinea unguium: Secondary | ICD-10-CM | POA: Diagnosis not present

## 2012-09-10 DIAGNOSIS — H353290 Exudative age-related macular degeneration, unspecified eye, stage unspecified: Secondary | ICD-10-CM | POA: Diagnosis not present

## 2012-11-04 ENCOUNTER — Other Ambulatory Visit: Payer: Self-pay | Admitting: Internal Medicine

## 2012-11-13 DIAGNOSIS — H353290 Exudative age-related macular degeneration, unspecified eye, stage unspecified: Secondary | ICD-10-CM | POA: Diagnosis not present

## 2012-11-21 DIAGNOSIS — M79609 Pain in unspecified limb: Secondary | ICD-10-CM | POA: Diagnosis not present

## 2012-11-21 DIAGNOSIS — B351 Tinea unguium: Secondary | ICD-10-CM | POA: Diagnosis not present

## 2012-12-31 DIAGNOSIS — H353290 Exudative age-related macular degeneration, unspecified eye, stage unspecified: Secondary | ICD-10-CM | POA: Diagnosis not present

## 2013-02-08 ENCOUNTER — Other Ambulatory Visit: Payer: Self-pay | Admitting: Internal Medicine

## 2013-02-14 ENCOUNTER — Encounter: Payer: Self-pay | Admitting: Internal Medicine

## 2013-02-14 ENCOUNTER — Ambulatory Visit (INDEPENDENT_AMBULATORY_CARE_PROVIDER_SITE_OTHER): Payer: Medicare Other | Admitting: Internal Medicine

## 2013-02-14 VITALS — BP 168/80 | HR 114 | Temp 98.4°F | Wt 349.0 lb

## 2013-02-14 DIAGNOSIS — L0231 Cutaneous abscess of buttock: Secondary | ICD-10-CM

## 2013-02-14 DIAGNOSIS — L03317 Cellulitis of buttock: Secondary | ICD-10-CM

## 2013-02-14 MED ORDER — CEPHALEXIN 500 MG PO TABS: 500.0000 mg | ORAL_TABLET | Freq: Four times a day (QID) | ORAL | Status: DC

## 2013-02-14 NOTE — Progress Notes (Signed)
  Subjective:    Patient ID: Brad Reed, male    DOB: 02/13/1935, 78 y.o.   MRN: [id number]  HPI Bad pilonidal cyst 40 years ago Has had intermittent drainage after surgery (persistent sinus tract) Malodorous drainage when it occurs  Started 2-3 weeks ago Often goes away on its own with sitz bath Not improving this time  Current Outpatient Prescriptions on File Prior to Visit  Medication Sig Dispense Refill  . aspirin 81 MG tablet Take 81 mg by mouth daily.        . finasteride (PROSCAR) 5 MG tablet Take 1 tablet (5 mg total) by mouth daily.  90 tablet  3  . Multiple Vitamins-Minerals (CENTRUM SILVER ULTRA MENS) TABS Take 1 tablet by mouth daily.      . potassium chloride (K-DUR) 10 MEQ tablet TAKE 1 TABLET (10 MEQ TOTAL) BY MOUTH 2 (TWO) TIMES DAILY.  180 tablet  2  . triamterene-hydrochlorothiazide (DYAZIDE) 37.5-25 MG per capsule TAKE 1 EACH (1 CAPSULE TOTAL) BY MOUTH EVERY MORNING.  90 capsule  2  . vitamin B-12 (CYANOCOBALAMIN) 1000 MCG tablet Take 1,000 mcg by mouth daily.       No current facility-administered medications on file prior to visit.    No Known Allergies  Past Medical History  Diagnosis Date  . Colon polyps   . Diverticulosis of colon   . Hypertension   . Angiodysplasia 02/13/2006  . Neuropathy   . Benign prostatic hypertrophy   . Obesity   . 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 Macular degeneration, wet     Dr Monte Fantasia    Past Surgical History  Procedure Laterality Date  . Mastoid debridement    . Appendectomy    . Tonsillectomy    . Pilonidal cyst excision  1960  . Knee arthroscopy      BOTH KNEES    Family History  Problem Relation Age of Onset  . Cancer Mother   . Obesity Sister   . Diabetes Sister   . Diabetes Maternal Uncle     History   Social History  . Marital Status: Widowed    Spouse Name: N/A    Number of Children: 3  . Years of Education: N/A   Occupational History  . 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 Smoking status: Former Smoker    Types: Cigarettes    Quit date: 05/17/1983  . Smokeless tobacco: Never Used  . Alcohol Use: Yes  . Drug Use: Not on file  . Sexual Activity: Not on file   Other Topics Concern  . Not on file   Social History Narrative   Has living will    Son [name] is health care POA   Would accept resuscitation but no prolonged artificial life support   No tube feeds if cognitively unaware   Review of Systems No fever Feels "run down"-- less energy Weight is up a few pounds     Objective:   Physical Exam  Constitutional: He appears well-developed and well-nourished. No distress.  Skin:  No pilonidal cyst or obvious sinus tract but is it moist Red, warm and tender area about 10cm vertically on inside of both buttock folds from top down          Assessment & Plan:

## 2013-02-14 NOTE — Assessment & Plan Note (Signed)
Recurrent problem May need powder there to dry things out Will try keflex

## 2013-02-15 DIAGNOSIS — H353290 Exudative age-related macular degeneration, unspecified eye, stage unspecified: Secondary | ICD-10-CM | POA: Diagnosis not present

## 2013-02-20 ENCOUNTER — Ambulatory Visit: Payer: Self-pay | Admitting: Podiatry

## 2013-02-25 ENCOUNTER — Ambulatory Visit (INDEPENDENT_AMBULATORY_CARE_PROVIDER_SITE_OTHER): Payer: Medicare Other | Admitting: Podiatry

## 2013-02-25 ENCOUNTER — Encounter: Payer: Self-pay | Admitting: Podiatry

## 2013-02-25 VITALS — BP 155/95 | HR 87 | Resp 16 | Ht 70.0 in | Wt 340.0 lb

## 2013-02-25 DIAGNOSIS — M79609 Pain in unspecified limb: Secondary | ICD-10-CM | POA: Insufficient documentation

## 2013-02-25 DIAGNOSIS — B351 Tinea unguium: Secondary | ICD-10-CM | POA: Diagnosis not present

## 2013-02-25 NOTE — Progress Notes (Signed)
Brad Reed presents today for chief complaint of painful elongated toenails one through 5 bilateral.  Objective: Pulses are strongly palpable bilateral is positive edema. No skin breakdown. Nails are thick yellow dystrophic clinically mycotic and painful on palpation as well as debridement.  Assessment: Pain in limb secondary to onychomycosis 1 through 5 bilateral.  Plan: Debridement of painful nails 1 through 5 bilateral covered service.

## 2013-03-21 ENCOUNTER — Other Ambulatory Visit: Payer: Self-pay

## 2013-04-05 DIAGNOSIS — H353290 Exudative age-related macular degeneration, unspecified eye, stage unspecified: Secondary | ICD-10-CM | POA: Diagnosis not present

## 2013-05-27 ENCOUNTER — Ambulatory Visit: Payer: Medicare Other | Admitting: Podiatry

## 2013-05-29 ENCOUNTER — Ambulatory Visit (INDEPENDENT_AMBULATORY_CARE_PROVIDER_SITE_OTHER): Payer: Medicare Other | Admitting: Podiatry

## 2013-05-29 ENCOUNTER — Encounter: Payer: Self-pay | Admitting: Podiatry

## 2013-05-29 VITALS — BP 157/88 | HR 101 | Resp 18

## 2013-05-29 DIAGNOSIS — M79609 Pain in unspecified limb: Secondary | ICD-10-CM

## 2013-05-29 DIAGNOSIS — B353 Tinea pedis: Secondary | ICD-10-CM

## 2013-05-29 DIAGNOSIS — B351 Tinea unguium: Secondary | ICD-10-CM | POA: Diagnosis not present

## 2013-05-29 NOTE — Progress Notes (Signed)
Just the toenails. They're painful as he refers to his toenails bilateral.  Objective: Vital signs are stable he is alert and oriented x3. Pulses are strongly palpable bilateral foot. His nails are thick yellow dystrophic clinically mycotic and painful palpation as well as debridement.  Assessment: Pain in limb secondary to onychomycosis 1 through 5 bilateral.  Plan: Debridement of nails 1 through 5 bilateral is a covered service secondary to pain.

## 2013-05-31 DIAGNOSIS — H353290 Exudative age-related macular degeneration, unspecified eye, stage unspecified: Secondary | ICD-10-CM | POA: Diagnosis not present

## 2013-07-10 ENCOUNTER — Other Ambulatory Visit: Payer: Self-pay | Admitting: Internal Medicine

## 2013-07-26 DIAGNOSIS — H353290 Exudative age-related macular degeneration, unspecified eye, stage unspecified: Secondary | ICD-10-CM | POA: Diagnosis not present

## 2013-07-26 DIAGNOSIS — H353190 Nonexudative age-related macular degeneration, unspecified eye, stage unspecified: Secondary | ICD-10-CM | POA: Diagnosis not present

## 2013-08-20 ENCOUNTER — Encounter: Payer: Self-pay | Admitting: Internal Medicine

## 2013-08-20 ENCOUNTER — Ambulatory Visit (INDEPENDENT_AMBULATORY_CARE_PROVIDER_SITE_OTHER): Payer: Medicare Other | Admitting: Internal Medicine

## 2013-08-20 VITALS — BP 150/80 | HR 94 | Temp 98.6°F | Ht 70.0 in | Wt 352.0 lb

## 2013-08-20 DIAGNOSIS — Z0000 Encounter for general adult medical examination without abnormal findings: Secondary | ICD-10-CM

## 2013-08-20 DIAGNOSIS — N400 Enlarged prostate without lower urinary tract symptoms: Secondary | ICD-10-CM

## 2013-08-20 DIAGNOSIS — R7301 Impaired fasting glucose: Secondary | ICD-10-CM | POA: Diagnosis not present

## 2013-08-20 DIAGNOSIS — G589 Mononeuropathy, unspecified: Secondary | ICD-10-CM | POA: Diagnosis not present

## 2013-08-20 DIAGNOSIS — Z23 Encounter for immunization: Secondary | ICD-10-CM

## 2013-08-20 DIAGNOSIS — I10 Essential (primary) hypertension: Secondary | ICD-10-CM | POA: Diagnosis not present

## 2013-08-20 LAB — COMPREHENSIVE METABOLIC PANEL
ALBUMIN: 3.7 g/dL (ref 3.5–5.2)
ALK PHOS: 52 U/L (ref 39–117)
ALT: 20 U/L (ref 0–53)
AST: 21 U/L (ref 0–37)
BILIRUBIN TOTAL: 0.6 mg/dL (ref 0.3–1.2)
BUN: 14 mg/dL (ref 6–23)
CO2: 34 mEq/L — ABNORMAL HIGH (ref 19–32)
CREATININE: 0.9 mg/dL (ref 0.4–1.5)
Calcium: 9.3 mg/dL (ref 8.4–10.5)
Chloride: 100 mEq/L (ref 96–112)
GFR: 84.83 mL/min (ref >60.00)
GLUCOSE: 110 mg/dL — AB (ref 70–99)
Potassium: 4.3 mEq/L (ref 3.5–5.1)
Sodium: 141 mEq/L (ref 135–145)
Total Protein: 6.6 g/dL (ref 6.0–8.3)

## 2013-08-20 LAB — CBC WITH DIFFERENTIAL/PLATELET
BASOS PCT: 0.2 % (ref 0.0–3.0)
Basophils Absolute: 0 10*3/uL (ref 0.0–0.1)
EOS ABS: 0.5 10*3/uL (ref 0.0–0.7)
EOS PCT: 5.3 % — AB (ref 0.0–5.0)
HCT: 47.7 % (ref 39.0–52.0)
HEMOGLOBIN: 16.3 g/dL (ref 13.0–17.0)
Lymphocytes Relative: 29.5 % (ref 12.0–46.0)
Lymphs Abs: 2.5 10*3/uL (ref 0.7–4.0)
MCHC: 34.1 g/dL (ref 30.0–36.0)
MCV: 100.7 fl — AB (ref 78.0–100.0)
MONO ABS: 0.8 10*3/uL (ref 0.1–1.0)
Monocytes Relative: 9.2 % (ref 3.0–12.0)
NEUTROS ABS: 4.7 10*3/uL (ref 1.4–7.7)
NEUTROS PCT: 55.8 % (ref 43.0–77.0)
Platelets: 181 10*3/uL (ref 150.0–400.0)
RBC: 4.73 Mil/uL (ref 4.22–5.81)
RDW: 13.3 % (ref 11.5–14.6)
WBC: 8.5 10*3/uL (ref 4.5–10.5)

## 2013-08-20 LAB — HEMOGLOBIN A1C: Hgb A1c MFr Bld: 5.7 % (ref 4.6–6.5)

## 2013-08-20 LAB — T4, FREE: Free T4: 0.73 ng/dL (ref 0.60–1.60)

## 2013-08-20 LAB — TSH: TSH: 2.7 u[IU]/mL (ref 0.35–5.50)

## 2013-08-20 NOTE — Assessment & Plan Note (Signed)
Better with the finasteride Still mildly symptomatic

## 2013-08-20 NOTE — Assessment & Plan Note (Signed)
Mild in feet No meds needed

## 2013-08-20 NOTE — Assessment & Plan Note (Signed)
BP Readings from Last 3 Encounters:  08/20/13 150/80  05/29/13 157/88  02/25/13 155/95   Acceptable for his age Missed his med for today also

## 2013-08-20 NOTE — Progress Notes (Signed)
Pre visit review using our clinic review tool, if applicable. No additional management support is needed unless otherwise documented below in the visit note. 

## 2013-08-20 NOTE — Assessment & Plan Note (Signed)
Discussed fitness and proper eating 

## 2013-08-20 NOTE — Patient Instructions (Signed)
DASH Diet  The DASH diet stands for "Dietary Approaches to Stop Hypertension." It is a healthy eating plan that has been shown to reduce high blood pressure (hypertension) in as little as 14 days, while also possibly providing other significant health benefits. These other health benefits include reducing the risk of breast cancer after menopause and reducing the risk of type 2 diabetes, heart disease, colon cancer, and stroke. Health benefits also include weight loss and slowing kidney failure in patients with chronic kidney disease.   DIET GUIDELINES  · Limit salt (sodium). Your diet should contain less than 1500 mg of sodium daily.  · Limit refined or processed carbohydrates. Your diet should include mostly whole grains. Desserts and added sugars should be used sparingly.  · Include small amounts of heart-healthy fats. These types of fats include nuts, oils, and tub margarine. Limit saturated and trans fats. These fats have been shown to be harmful in the body.  CHOOSING FOODS   The following food groups are based on a 2000 calorie diet. See your Registered Dietitian for individual calorie needs.  Grains and Grain Products (6 to 8 servings daily)  · Eat More Often: Whole-wheat bread, brown rice, whole-grain or wheat pasta, quinoa, popcorn without added fat or salt (air popped).  · Eat Less Often: White bread, white pasta, white rice, cornbread.  Vegetables (4 to 5 servings daily)  · Eat More Often: Fresh, frozen, and canned vegetables. Vegetables may be raw, steamed, roasted, or grilled with a minimal amount of fat.  · Eat Less Often/Avoid: Creamed or fried vegetables. Vegetables in a cheese sauce.  Fruit (4 to 5 servings daily)  · Eat More Often: All fresh, canned (in natural juice), or frozen fruits. Dried fruits without added sugar. One hundred percent fruit juice (½ cup [237 mL] daily).  · Eat Less Often: Dried fruits with added sugar. Canned fruit in light or heavy syrup.  Lean Meats, Fish, and Poultry (2  servings or less daily. One serving is 3 to 4 oz [85-114 g]).  · Eat More Often: Ninety percent or leaner ground beef, tenderloin, sirloin. Round cuts of beef, chicken breast, turkey breast. All fish. Grill, bake, or broil your meat. Nothing should be fried.  · Eat Less Often/Avoid: Fatty cuts of meat, turkey, or chicken leg, thigh, or wing. Fried cuts of meat or fish.  Dairy (2 to 3 servings)  · Eat More Often: Low-fat or fat-free milk, low-fat plain or light yogurt, reduced-fat or part-skim cheese.  · Eat Less Often/Avoid: Milk (whole, 2%). Whole milk yogurt. Full-fat cheeses.  Nuts, Seeds, and Legumes (4 to 5 servings per week)  · Eat More Often: All without added salt.  · Eat Less Often/Avoid: Salted nuts and seeds, canned beans with added salt.  Fats and Sweets (limited)  · Eat More Often: Vegetable oils, tub margarines without trans fats, sugar-free gelatin. Mayonnaise and salad dressings.  · Eat Less Often/Avoid: Coconut oils, palm oils, butter, stick margarine, cream, half and half, cookies, candy, pie.  FOR MORE INFORMATION  The Dash Diet Eating Plan: www.dashdiet.org  Document Released: 04/21/2011 Document Revised: 07/25/2011 Document Reviewed: 04/21/2011  Exit[employer]® Patient Information ©2014 [employer].

## 2013-08-20 NOTE — Addendum Note (Signed)
Addended by: Sueanne MargaritaSMITH, DESHANNON L on: 08/20/2013 12:58 PM   Modules accepted: Orders

## 2013-08-20 NOTE — Assessment & Plan Note (Signed)
I have personally reviewed the Medicare Annual Wellness questionnaire and have noted 1. The patient's medical and social history 2. Their use of alcohol, tobacco or illicit drugs 3. Their current medications and supplements 4. The patient's functional ability including ADL's, fall risks, home safety risks and hearing or visual             impairment. 5. Diet and physical activities 6. Evidence for depression or mood disorders  The patients weight, height, BMI and visual acuity have been recorded in the chart I have made referrals, counseling and provided education to the patient based review of the above and I have provided the pt with a written personalized care plan for preventive services.  I have provided you with a copy of your personalized plan for preventive services. Please take the time to review along with your updated medication list.  Prefers no flu shot or zostavax Will take prevnar No cancer screening Discussed DASH diet and trying some exercise

## 2013-08-20 NOTE — Progress Notes (Signed)
Subjective:    Patient ID: Brad Reed, male    DOB: [birth year], 79 y.o.   MRN: [id number]  HPI Here for Medicare wellness visit and follow up Prefers no flu shot or zostavax. Will take prevnar Reviewed form Alcohol-- 2 drinks per day. No tobacco. No exercise--discussed No falls No depression or anhedonia. Just back from spring break. Reviewed other physicians Independent in all instrumental ADLs Poor hearing since childhood---mastoid surgery on left.  Vision is okay--ongoing Rx for macular degeneration  Now on finasteride for a year. No longer has the leakage before getting to bathroom Still incomplete emptying  Mild urgency but less of a problem Occasional nocturia  No chest pain Gets DOE with minimal exertion---not really changed No dizziness or syncope Some swelling in legs---if he sits for a prolonged time  Same decreased sensation in feet Still on vitamin B12--thinks it is worse if he misses this  Current Outpatient Prescriptions on File Prior to Visit  Medication Sig Dispense Refill  . aspirin 81 MG tablet Take 81 mg by mouth daily.        . finasteride (PROSCAR) 5 MG tablet Take 1 tablet (5 mg total) by mouth daily.  90 tablet  3  . Multiple Vitamins-Minerals (CENTRUM SILVER ULTRA MENS) TABS Take 1 tablet by mouth daily.      . Naproxen Sodium (ALEVE) 220 MG CAPS Take 1-2 capsules by mouth as needed.      . potassium chloride (K-DUR) 10 MEQ tablet TAKE 1 TABLET (10 MEQ TOTAL) BY MOUTH 2 (TWO) TIMES DAILY.  180 tablet  1  . triamterene-hydrochlorothiazide (DYAZIDE) 37.5-25 MG per capsule TAKE 1 EACH (1 CAPSULE TOTAL) BY MOUTH EVERY MORNING.  90 capsule  2  . vitamin B-12 (CYANOCOBALAMIN) 1000 MCG tablet Take 1,000 mcg by mouth daily.       No current facility-administered medications on file prior to visit.    No Known Allergies  Past Medical History  Diagnosis Date  . Colon polyps   . Diverticulosis of colon   . Hypertension   . Angiodysplasia 02/13/2006   . Neuropathy   . Benign prostatic hypertrophy   . Obesity   . 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 Macular degeneration, wet     Dr Monte Fantasia    Past Surgical History  Procedure Laterality Date  . Mastoid debridement    . Appendectomy    . Tonsillectomy    . Pilonidal cyst excision  1960  . Knee arthroscopy      BOTH KNEES    Family History  Problem Relation Age of Onset  . Cancer Mother   . Obesity Sister   . Diabetes Sister   . Diabetes Maternal Uncle     History   Social History  . Marital Status: Widowed    Spouse Name: N/A    Number of Children: 3  . Years of Education: N/A   Occupational History  . 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 Smoking status: Former Smoker    Types: Cigarettes    Quit date: 05/17/1983  . Smokeless tobacco: Never Used  . Alcohol Use: Yes     Comment: DRINK EVERYDAY  . Drug Use: No  . Sexual Activity: Not on file   Other Topics Concern  . Not on file   Social History Narrative   Has living will    Son [name] is health  care POA   Would accept resuscitation but no prolonged artificial life support   No tube feeds if cognitively unaware   Review of Systems Left thumb tip is numb. Not painful Weight up 12# since last year Sleeps great Bowels are fine    Objective:   Physical Exam  Constitutional: He is oriented to person, place, and time. He appears well-developed and well-nourished. No distress.  HENT:  Mouth/Throat: Oropharynx is clear and moist. No oropharyngeal exudate.  Neck: Normal range of motion. Neck supple. No thyromegaly present.  Cardiovascular: Normal rate, regular rhythm, normal heart sounds and intact distal pulses.  Exam reveals no gallop.   No murmur heard. Pulmonary/Chest: Effort normal and breath sounds normal. No respiratory distress. He has no wheezes. He has no rales.  Abdominal: Soft. There is no tenderness.  Musculoskeletal:  Thick  calves but no pitting  Lymphadenopathy:    He has no cervical adenopathy.  Neurological: He is alert and oriented to person, place, and time.  President-- "Obama, Bush, Clinton" [id number] D-l-r-o-w Recall 3/3  Skin: No rash noted.  Psychiatric: He has a normal mood and affect. His behavior is normal.          Assessment & Plan:

## 2013-08-21 ENCOUNTER — Telehealth: Payer: Self-pay | Admitting: Internal Medicine

## 2013-08-21 NOTE — Telephone Encounter (Signed)
Relevant patient education assigned to patient using Emmi. ° °

## 2013-08-28 ENCOUNTER — Encounter: Payer: Self-pay | Admitting: Podiatry

## 2013-08-28 ENCOUNTER — Ambulatory Visit (INDEPENDENT_AMBULATORY_CARE_PROVIDER_SITE_OTHER): Payer: Medicare Other | Admitting: Podiatry

## 2013-08-28 VITALS — BP 140/80 | HR 92 | Resp 18

## 2013-08-28 DIAGNOSIS — B351 Tinea unguium: Secondary | ICD-10-CM | POA: Diagnosis not present

## 2013-08-28 DIAGNOSIS — M79609 Pain in unspecified limb: Secondary | ICD-10-CM

## 2013-08-28 NOTE — Progress Notes (Signed)
He presents today with a chief complaint of painful toenails bilateral.  Objective: Vital signs are stable he is alert and oriented x3. Nails are thick yellow dystrophic onychomycotic and painful palpation.  Assessment: Pain in limb secondary to onychomycosis 1 through 5 bilateral.  Plan: Debridement of nails 1 through 5 bilateral.

## 2013-09-10 ENCOUNTER — Other Ambulatory Visit: Payer: Self-pay | Admitting: Internal Medicine

## 2013-09-13 DIAGNOSIS — H353290 Exudative age-related macular degeneration, unspecified eye, stage unspecified: Secondary | ICD-10-CM | POA: Diagnosis not present

## 2013-11-05 DIAGNOSIS — H353290 Exudative age-related macular degeneration, unspecified eye, stage unspecified: Secondary | ICD-10-CM | POA: Diagnosis not present

## 2013-11-20 ENCOUNTER — Other Ambulatory Visit: Payer: Self-pay | Admitting: Internal Medicine

## 2013-12-11 ENCOUNTER — Ambulatory Visit (INDEPENDENT_AMBULATORY_CARE_PROVIDER_SITE_OTHER): Payer: Medicare Other | Admitting: Podiatry

## 2013-12-11 DIAGNOSIS — B351 Tinea unguium: Secondary | ICD-10-CM | POA: Diagnosis not present

## 2013-12-11 DIAGNOSIS — M79676 Pain in unspecified toe(s): Secondary | ICD-10-CM

## 2013-12-11 DIAGNOSIS — M79609 Pain in unspecified limb: Secondary | ICD-10-CM | POA: Diagnosis not present

## 2013-12-11 NOTE — Progress Notes (Signed)
He presents today with a chief complaint of painful elongated toenails.  Objective: Vital signs are stable he is alert and oriented x3.  Assessment: Diabetes mellitus with diabetic peripheral neuropathy. Pain in limb secondary to onychomycosis 1 through 5 bilateral.  Plan: Debridement of nails 1 through 5 bilateral.

## 2014-01-27 DIAGNOSIS — H353290 Exudative age-related macular degeneration, unspecified eye, stage unspecified: Secondary | ICD-10-CM | POA: Diagnosis not present

## 2014-03-12 ENCOUNTER — Ambulatory Visit (INDEPENDENT_AMBULATORY_CARE_PROVIDER_SITE_OTHER): Payer: Medicare Other | Admitting: Podiatry

## 2014-03-12 DIAGNOSIS — M79676 Pain in unspecified toe(s): Secondary | ICD-10-CM | POA: Diagnosis not present

## 2014-03-12 DIAGNOSIS — B351 Tinea unguium: Secondary | ICD-10-CM

## 2014-03-12 NOTE — Progress Notes (Signed)
Presents today chief complaint of painful elongated toenails.  Objective: Pulses are palpable bilateral nails are thick, yellow dystrophic onychomycosis and painful palpation.   Assessment: Onychomycosis with pain in limb.  Plan: Treatment of nails in thickness and length as covered service secondary to pain.  

## 2014-03-26 ENCOUNTER — Ambulatory Visit (INDEPENDENT_AMBULATORY_CARE_PROVIDER_SITE_OTHER): Payer: Medicare Other

## 2014-03-26 DIAGNOSIS — Z23 Encounter for immunization: Secondary | ICD-10-CM | POA: Diagnosis not present

## 2014-04-02 DIAGNOSIS — H3532 Exudative age-related macular degeneration: Secondary | ICD-10-CM | POA: Diagnosis not present

## 2014-04-08 ENCOUNTER — Other Ambulatory Visit: Payer: Self-pay | Admitting: Internal Medicine

## 2014-06-10 DIAGNOSIS — H3532 Exudative age-related macular degeneration: Secondary | ICD-10-CM | POA: Diagnosis not present

## 2014-06-11 ENCOUNTER — Ambulatory Visit (INDEPENDENT_AMBULATORY_CARE_PROVIDER_SITE_OTHER): Payer: Medicare Other | Admitting: Podiatry

## 2014-06-11 DIAGNOSIS — B351 Tinea unguium: Secondary | ICD-10-CM

## 2014-06-11 DIAGNOSIS — M79676 Pain in unspecified toe(s): Secondary | ICD-10-CM | POA: Diagnosis not present

## 2014-06-11 NOTE — Progress Notes (Signed)
He presents today with a chief complaint of painfully elongated toenails.  Objective: Nails are thick yellow dystrophic mycotic and painful palpation.  Assessment: Pain in limb secondary to onychomycosis 1 through 5 bilateral.  Plan: Debridement of nails 1 through 5 bilateral covered service secondary to pain.

## 2014-07-28 DIAGNOSIS — H3532 Exudative age-related macular degeneration: Secondary | ICD-10-CM | POA: Diagnosis not present

## 2014-08-26 ENCOUNTER — Encounter: Payer: Self-pay | Admitting: Internal Medicine

## 2014-08-26 ENCOUNTER — Ambulatory Visit (INDEPENDENT_AMBULATORY_CARE_PROVIDER_SITE_OTHER): Payer: Medicare Other | Admitting: Internal Medicine

## 2014-08-26 VITALS — BP 148/58 | HR 96 | Temp 97.7°F | Ht 69.0 in | Wt 358.5 lb

## 2014-08-26 DIAGNOSIS — Z7189 Other specified counseling: Secondary | ICD-10-CM | POA: Insufficient documentation

## 2014-08-26 DIAGNOSIS — H353290 Exudative age-related macular degeneration, unspecified eye, stage unspecified: Secondary | ICD-10-CM

## 2014-08-26 DIAGNOSIS — Z0000 Encounter for general adult medical examination without abnormal findings: Secondary | ICD-10-CM | POA: Diagnosis not present

## 2014-08-26 DIAGNOSIS — Z79899 Other long term (current) drug therapy: Secondary | ICD-10-CM

## 2014-08-26 DIAGNOSIS — G629 Polyneuropathy, unspecified: Secondary | ICD-10-CM

## 2014-08-26 DIAGNOSIS — R7301 Impaired fasting glucose: Secondary | ICD-10-CM

## 2014-08-26 DIAGNOSIS — I10 Essential (primary) hypertension: Secondary | ICD-10-CM | POA: Diagnosis not present

## 2014-08-26 DIAGNOSIS — Z6841 Body Mass Index (BMI) 40.0 and over, adult: Secondary | ICD-10-CM

## 2014-08-26 DIAGNOSIS — H3532 Exudative age-related macular degeneration: Secondary | ICD-10-CM

## 2014-08-26 DIAGNOSIS — N400 Enlarged prostate without lower urinary tract symptoms: Secondary | ICD-10-CM

## 2014-08-26 LAB — CBC WITH DIFFERENTIAL/PLATELET
BASOS ABS: 0 10*3/uL (ref 0.0–0.1)
Basophils Relative: 0.5 % (ref 0.0–3.0)
EOS ABS: 0.2 10*3/uL (ref 0.0–0.7)
Eosinophils Relative: 2.9 % (ref 0.0–5.0)
HCT: 47.9 % (ref 39.0–52.0)
Hemoglobin: 16.1 g/dL (ref 13.0–17.0)
LYMPHS PCT: 28 % (ref 12.0–46.0)
Lymphs Abs: 2.3 10*3/uL (ref 0.7–4.0)
MCHC: 33.7 g/dL (ref 30.0–36.0)
MCV: 97.7 fl (ref 78.0–100.0)
MONOS PCT: 9.3 % (ref 3.0–12.0)
Monocytes Absolute: 0.8 10*3/uL (ref 0.1–1.0)
Neutro Abs: 4.8 10*3/uL (ref 1.4–7.7)
Neutrophils Relative %: 59.3 % (ref 43.0–77.0)
Platelets: 167 10*3/uL (ref 150.0–400.0)
RBC: 4.9 Mil/uL (ref 4.22–5.81)
RDW: 14.2 % (ref 11.5–15.5)
WBC: 8.1 10*3/uL (ref 4.0–10.5)

## 2014-08-26 LAB — T4, FREE: Free T4: 0.79 ng/dL (ref 0.60–1.60)

## 2014-08-26 LAB — COMPREHENSIVE METABOLIC PANEL
ALT: 17 U/L (ref 0–53)
AST: 20 U/L (ref 0–37)
Albumin: 3.6 g/dL (ref 3.5–5.2)
Alkaline Phosphatase: 59 U/L (ref 39–117)
BILIRUBIN TOTAL: 0.6 mg/dL (ref 0.2–1.2)
BUN: 14 mg/dL (ref 6–23)
CHLORIDE: 99 meq/L (ref 96–112)
CO2: 36 meq/L — AB (ref 19–32)
CREATININE: 0.93 mg/dL (ref 0.40–1.50)
Calcium: 9.5 mg/dL (ref 8.4–10.5)
GFR: 83.55 mL/min (ref >60.00)
Glucose, Bld: 109 mg/dL — ABNORMAL HIGH (ref 70–99)
Potassium: 4.3 mEq/L (ref 3.5–5.1)
SODIUM: 138 meq/L (ref 135–145)
Total Protein: 6.6 g/dL (ref 6.0–8.3)

## 2014-08-26 LAB — VITAMIN B12: Vitamin B-12: 587 pg/mL (ref 211–911)

## 2014-08-26 LAB — HEMOGLOBIN A1C: Hgb A1c MFr Bld: 5.7 % (ref 4.6–6.5)

## 2014-08-26 MED ORDER — POTASSIUM CHLORIDE ER 10 MEQ PO TBCR: EXTENDED_RELEASE_TABLET | ORAL | Status: DC

## 2014-08-26 MED ORDER — TRIAMTERENE-HCTZ 37.5-25 MG PO CAPS: ORAL_CAPSULE | ORAL | Status: DC

## 2014-08-26 NOTE — Assessment & Plan Note (Signed)
Will recheck labs 

## 2014-08-26 NOTE — Progress Notes (Signed)
Subjective:    Patient ID: Brad Reed, male    DOB: 07-20-1934, 80 y.o.   MRN: [id number]  HPI Here for Medicare wellness visit and follow up of chronic medical problems Reviewed form and advanced directives Not able to exercise--- tough to even walk with his weight Tried the pool--hard due to neuropathy in feet 2 drinks per day--gin or vodka usually No tobacco products Hearing is fair---always down in left due to past mastoid operation Vision is fair. Wet macular degeneration in left. Cataract on right Reviewed his other physicians No falls No depression or anhedonia---does get bored at times Independent with instrumental ADLs No significant memory problems---always has trouble with names  No new concerns Weight is up a few pounds-- 6#  Ongoing numbness in feet Stopped the vitamin B12-- no obvious change Now just on a multivitamin-- instead of all his supplements  No chest pain Stable DOE walking up stairs or extended distance Uses the handicapped permit No dizziness or syncope  Urinary stream is slow Some dribbling Nocturia x 1 still Some daytime urgency which is stable Stopped the finasteride  Current Outpatient Prescriptions on File Prior to Visit  Medication Sig Dispense Refill  . aspirin 81 MG tablet Take 81 mg by mouth daily.      . Multiple Vitamins-Minerals (CENTRUM SILVER ULTRA MENS) TABS Take 1 tablet by mouth daily.    . Naproxen Sodium (ALEVE) 220 MG CAPS Take 1-2 capsules by mouth as needed.    . potassium chloride (K-DUR) 10 MEQ tablet TAKE 1 TABLET (10 MEQ TOTAL) BY MOUTH 2 (TWO) TIMES DAILY. 180 tablet 0  . triamterene-hydrochlorothiazide (DYAZIDE) 37.5-25 MG per capsule TAKE 1 CAPSULE  BY MOUTH EVERY MORNING. 90 capsule 1   No current facility-administered medications on file prior to visit.    No Known Allergies  Past Medical History  Diagnosis Date  . Colon polyps   . Diverticulosis of colon   . Hypertension   . Angiodysplasia 02/13/2006   . Benign prostatic hypertrophy   . Obesity   . Macular degeneration, wet     Dr Chyrl Civatte  . Peripheral neuropathy     Past Surgical History  Procedure Laterality Date  . Mastoid debridement    . Appendectomy    . Tonsillectomy    . Pilonidal cyst excision  1960  . Knee arthroscopy      BOTH KNEES    Family History  Problem Relation Age of Onset  . Cancer Mother   . Obesity Sister   . Diabetes Sister   . Diabetes Maternal Uncle     History   Social History  . Marital Status: Widowed    Spouse Name: N/A  . Number of Children: 3  . Years of Education: N/A   Occupational History  . 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 Smoking status: Former Smoker    Types: Cigarettes    Quit date: 05/17/1983  . Smokeless tobacco: Never Used  . Alcohol Use: 0.0 oz/week    0 Standard drinks or equivalent per week     Comment: DRINK EVERYDAY  . Drug Use: No  . Sexual Activity: Not on file   Other Topics Concern  . Not on file   Social History Narrative   Has living will    Son [name] is health care POA   Would accept resuscitation but no prolonged artificial life support   No tube  feeds if cognitively unaware   Review of Systems Gets occasional back pain Uses aleve regularly--- 3 total a day Sleeps very well Appetite is fine Bowels are fine Due for dentist--having some issues (needs bridge) Doesn't wear seat belt--counseled    Objective:   Physical Exam  Constitutional: He is oriented to person, place, and time. He appears well-developed. No distress.  HENT:  Mouth/Throat: Oropharynx is clear and moist. No oropharyngeal exudate.  Neck: Normal range of motion. Neck supple. No thyromegaly present.  Cardiovascular: Normal rate, regular rhythm, normal heart sounds and intact distal pulses.  Exam reveals no gallop.   No murmur heard. Pulmonary/Chest: Effort normal and breath sounds normal. No respiratory  distress. He has no wheezes. He has no rales.  Abdominal: Soft. There is no tenderness.  Musculoskeletal:  2+ edema in calves  Lymphadenopathy:    He has no cervical adenopathy.  Neurological: He is alert and oriented to person, place, and time.  President -- "Obama, Bush, Clinton" [phone] D-l-r-o-w Recall 3/3  Skin:  Stable nodular area on low left calf  Psychiatric: He has a normal mood and affect. His behavior is normal.          Assessment & Plan:

## 2014-08-26 NOTE — Assessment & Plan Note (Signed)
I have personally reviewed the Medicare Annual Wellness questionnaire and have noted 1. The patient's medical and social history 2. Their use of alcohol, tobacco or illicit drugs 3. Their current medications and supplements 4. The patient's functional ability including ADL's, fall risks, home safety risks and hearing or visual             impairment. 5. Diet and physical activities 6. Evidence for depression or mood disorders  The patients weight, height, BMI and visual acuity have been recorded in the chart I have made referrals, counseling and provided education to the patient based review of the above and I have provided the pt with a written personalized care plan for preventive services.  I have provided you with a copy of your personalized plan for preventive services. Please take the time to review along with your updated medication list.  No cancer screening after discussion Did take flu shot Recommended tetanus shot---he prefers not (and no zostavax) Discussed fitness (can't really exercise but needs to watch eating)

## 2014-08-26 NOTE — Assessment & Plan Note (Signed)
BP Readings from Last 3 Encounters:  08/26/14 148/58  08/28/13 140/80  08/20/13 150/80   Reasonable control for his age No changes needed

## 2014-08-26 NOTE — Assessment & Plan Note (Signed)
See social history 

## 2014-08-26 NOTE — Progress Notes (Signed)
Pre visit review using our clinic review tool, if applicable. No additional management support is needed unless otherwise documented below in the visit note. 

## 2014-08-26 NOTE — Assessment & Plan Note (Signed)
Not motivated for lifestyle changes

## 2014-08-26 NOTE — Assessment & Plan Note (Signed)
Ongoing symptoms but stable even off the finasteride No other action on this

## 2014-08-26 NOTE — Assessment & Plan Note (Signed)
No real change off the medication  Will check the level

## 2014-08-26 NOTE — Assessment & Plan Note (Signed)
Continues with eye doctor

## 2014-09-08 ENCOUNTER — Ambulatory Visit (INDEPENDENT_AMBULATORY_CARE_PROVIDER_SITE_OTHER): Payer: Medicare Other | Admitting: Podiatry

## 2014-09-08 DIAGNOSIS — M79676 Pain in unspecified toe(s): Secondary | ICD-10-CM

## 2014-09-08 DIAGNOSIS — B351 Tinea unguium: Secondary | ICD-10-CM

## 2014-09-08 NOTE — Progress Notes (Signed)
He presents today with a chief complaint of painfully elongated toenails.  Objective: Nails are thick yellow dystrophic mycotic and painful palpation.  Assessment: Pain in limb secondary to onychomycosis 1 through 5 bilateral.  Plan: Debridement of nails 1 through 5 bilateral covered service secondary to pain.

## 2014-09-17 DIAGNOSIS — H3532 Exudative age-related macular degeneration: Secondary | ICD-10-CM | POA: Diagnosis not present

## 2014-11-05 DIAGNOSIS — H3532 Exudative age-related macular degeneration: Secondary | ICD-10-CM | POA: Diagnosis not present

## 2014-12-29 ENCOUNTER — Ambulatory Visit (INDEPENDENT_AMBULATORY_CARE_PROVIDER_SITE_OTHER): Payer: Medicare Other | Admitting: Podiatry

## 2014-12-29 DIAGNOSIS — M79676 Pain in unspecified toe(s): Secondary | ICD-10-CM | POA: Diagnosis not present

## 2014-12-29 DIAGNOSIS — B351 Tinea unguium: Secondary | ICD-10-CM | POA: Diagnosis not present

## 2014-12-29 NOTE — Progress Notes (Signed)
He presents today with a chief complaint of painfully elongated toenails to toes 1 through 5 bilaterally. He states that he needs to lose weight and start walking more. He states that the right foot is doing much better with circulation.  Objective: Nails are thick yellow dystrophic mycotic and painful palpation. Mild edema bilateral. Pulses are strong and palpable bilateral. Pain in limb secondary to onychomycosis and sharply incurvated nail margins. Ingrown nails edema bilateral.  Assessment: Pain in limb secondary to onychomycosis 1 through 5 bilateral.  Plan: Debridement of nails 1 through 5 bilateral covered service secondary to pain. 3 mo.

## 2014-12-31 DIAGNOSIS — H3531 Nonexudative age-related macular degeneration: Secondary | ICD-10-CM | POA: Diagnosis not present

## 2014-12-31 DIAGNOSIS — H3532 Exudative age-related macular degeneration: Secondary | ICD-10-CM | POA: Diagnosis not present

## 2015-01-30 ENCOUNTER — Other Ambulatory Visit: Payer: Self-pay | Admitting: Internal Medicine

## 2015-02-18 DIAGNOSIS — H353221 Exudative age-related macular degeneration, left eye, with active choroidal neovascularization: Secondary | ICD-10-CM | POA: Diagnosis not present

## 2015-04-01 ENCOUNTER — Ambulatory Visit (INDEPENDENT_AMBULATORY_CARE_PROVIDER_SITE_OTHER): Payer: Medicare Other | Admitting: Podiatry

## 2015-04-01 ENCOUNTER — Encounter: Payer: Self-pay | Admitting: Podiatry

## 2015-04-01 DIAGNOSIS — M79676 Pain in unspecified toe(s): Secondary | ICD-10-CM | POA: Diagnosis not present

## 2015-04-01 DIAGNOSIS — B351 Tinea unguium: Secondary | ICD-10-CM

## 2015-04-01 NOTE — Progress Notes (Signed)
Presents today chief complaint of painful elongated toenails.  Objective: Pulses are strongly palpable bilateral. Toenails are thick yellow dystrophic onychomycotic and painful palpation.  Assessment: Pain in limb secondary to onychomycosis 1 through 5 bilateral.  Plan: Debridement of nails 1 through 5 bilateral.

## 2015-04-15 DIAGNOSIS — H353221 Exudative age-related macular degeneration, left eye, with active choroidal neovascularization: Secondary | ICD-10-CM | POA: Diagnosis not present

## 2015-06-03 DIAGNOSIS — H353222 Exudative age-related macular degeneration, left eye, with inactive choroidal neovascularization: Secondary | ICD-10-CM | POA: Diagnosis not present

## 2015-07-06 ENCOUNTER — Encounter: Payer: Self-pay | Admitting: Podiatry

## 2015-07-06 ENCOUNTER — Ambulatory Visit (INDEPENDENT_AMBULATORY_CARE_PROVIDER_SITE_OTHER): Payer: Medicare Other | Admitting: Podiatry

## 2015-07-06 DIAGNOSIS — B351 Tinea unguium: Secondary | ICD-10-CM

## 2015-07-06 DIAGNOSIS — M79676 Pain in unspecified toe(s): Secondary | ICD-10-CM

## 2015-07-06 NOTE — Progress Notes (Signed)
He presents today chief complaint of painful elongated toenails.  Objective: Vital signs are stable he is alert and oriented 3 pulses remain palpable bilateral. No open lesions or wounds. His toenails are thick yellow dystrophic onychomycotic and painful on palpation.  Assessment: Pain in limb secondary to onychomycosis 1 through 5 bilateral.  Plan: Debridement of toenails 1 through 5 bilateral covered service secondary to pain.

## 2015-07-31 DIAGNOSIS — H353222 Exudative age-related macular degeneration, left eye, with inactive choroidal neovascularization: Secondary | ICD-10-CM | POA: Diagnosis not present

## 2015-09-09 ENCOUNTER — Ambulatory Visit (INDEPENDENT_AMBULATORY_CARE_PROVIDER_SITE_OTHER): Payer: Medicare Other | Admitting: Internal Medicine

## 2015-09-09 ENCOUNTER — Encounter: Payer: Self-pay | Admitting: Internal Medicine

## 2015-09-09 ENCOUNTER — Telehealth: Payer: Self-pay | Admitting: *Deleted

## 2015-09-09 VITALS — BP 130/88 | HR 84 | Temp 97.2°F | Ht 68.0 in | Wt 329.0 lb

## 2015-09-09 DIAGNOSIS — Z6841 Body Mass Index (BMI) 40.0 and over, adult: Secondary | ICD-10-CM

## 2015-09-09 DIAGNOSIS — G629 Polyneuropathy, unspecified: Secondary | ICD-10-CM | POA: Diagnosis not present

## 2015-09-09 DIAGNOSIS — H353290 Exudative age-related macular degeneration, unspecified eye, stage unspecified: Secondary | ICD-10-CM | POA: Diagnosis not present

## 2015-09-09 DIAGNOSIS — I10 Essential (primary) hypertension: Secondary | ICD-10-CM | POA: Diagnosis not present

## 2015-09-09 DIAGNOSIS — Z0000 Encounter for general adult medical examination without abnormal findings: Secondary | ICD-10-CM

## 2015-09-09 DIAGNOSIS — Z7189 Other specified counseling: Secondary | ICD-10-CM

## 2015-09-09 LAB — COMPREHENSIVE METABOLIC PANEL
ALBUMIN: 4 g/dL (ref 3.5–5.2)
ALT: 24 U/L (ref 0–53)
AST: 27 U/L (ref 0–37)
Alkaline Phosphatase: 64 U/L (ref 39–117)
BUN: 14 mg/dL (ref 6–23)
CHLORIDE: 100 meq/L (ref 96–112)
CO2: 32 mEq/L (ref 19–32)
CREATININE: 1.06 mg/dL (ref 0.40–1.50)
Calcium: 9.7 mg/dL (ref 8.4–10.5)
GFR: 71.65 mL/min (ref >60.00)
Glucose, Bld: 104 mg/dL — ABNORMAL HIGH (ref 70–99)
Potassium: 4.1 mEq/L (ref 3.5–5.1)
Sodium: 142 mEq/L (ref 135–145)
Total Bilirubin: 0.8 mg/dL (ref 0.2–1.2)
Total Protein: 7.2 g/dL (ref 6.0–8.3)

## 2015-09-09 LAB — CBC WITH DIFFERENTIAL/PLATELET
BASOS PCT: 0.6 % (ref 0.0–3.0)
Basophils Absolute: 0 10*3/uL (ref 0.0–0.1)
EOS PCT: 2.4 % (ref 0.0–5.0)
Eosinophils Absolute: 0.2 10*3/uL (ref 0.0–0.7)
Hemoglobin: 18.1 g/dL (ref 13.0–17.0)
LYMPHS PCT: 30.9 % (ref 12.0–46.0)
Lymphs Abs: 2.5 10*3/uL (ref 0.7–4.0)
MCHC: 33.4 g/dL (ref 30.0–36.0)
MCV: 97.6 fl (ref 78.0–100.0)
Monocytes Absolute: 0.7 10*3/uL (ref 0.1–1.0)
Monocytes Relative: 8.5 % (ref 3.0–12.0)
NEUTROS ABS: 4.7 10*3/uL (ref 1.4–7.7)
Neutrophils Relative %: 57.6 % (ref 43.0–77.0)
Platelets: 173 10*3/uL (ref 150.0–400.0)
RBC: 5.58 Mil/uL (ref 4.22–5.81)
RDW: 14.7 % (ref 11.5–15.5)
WBC: 8.2 10*3/uL (ref 4.0–10.5)

## 2015-09-09 NOTE — Assessment & Plan Note (Signed)
Has lost 30# and continues to work on fitness

## 2015-09-09 NOTE — Telephone Encounter (Signed)
Critical labs, Hgb 18.1, Hct 54.0. Dr. Alphonsus SiasLetvak is out of office with no acces to computer, so result given to Dr. Milinda Antisower.

## 2015-09-09 NOTE — Assessment & Plan Note (Signed)
Continues on avastin injections Vision stable

## 2015-09-09 NOTE — Telephone Encounter (Signed)
This does not require immediate action and is not a problem No action Please let Dr T know--I am still here

## 2015-09-09 NOTE — Progress Notes (Signed)
Pre visit review using our clinic review tool, if applicable. No additional management support is needed unless otherwise documented below in the visit note. 

## 2015-09-09 NOTE — Assessment & Plan Note (Signed)
I have personally reviewed the Medicare Annual Wellness questionnaire and have noted 1. The patient's medical and social history 2. Their use of alcohol, tobacco or illicit drugs 3. Their current medications and supplements 4. The patient's functional ability including ADL's, fall risks, home safety risks and hearing or visual             impairment. 5. Diet and physical activities 6. Evidence for depression or mood disorders  The patients weight, height, BMI and visual acuity have been recorded in the chart I have made referrals, counseling and provided education to the patient based review of the above and I have provided the pt with a written personalized care plan for preventive services.  I have provided you with a copy of your personalized plan for preventive services. Please take the time to review along with your updated medication list.  No cancer screening due to age Prefers no zostavax Will wait on Td till he has injury Discussed fitness

## 2015-09-09 NOTE — Progress Notes (Signed)
Subjective:    Patient ID: Brad Reed, male    DOB: 01/28/1935, 81 y.o.   MRN: [id number]  HPI Here for Medicare wellness and follow up of chronic health issues Reviewed form and advanced directives Reviewed other doctors Chronic hearing loss--no change since childhood Vision fair Usually 1 vodka or gin nightly--occasionally wine instead No tobacco Doing more exercise this year Independent with instrumental ADLs No falls Gets depressed sporadically --nothing persistent. (like thinking about wife). Not anhedonic No obvious cognitive decline (just trouble remembering names)  Has moved in with sister Good for both of them  Ongoing problems with neuropathy Worried about falls---but hasn't Doesn't feel a cane helps balance Not really painful fortunately  Still getting shots in eyes for MD every 7 weeks (avastin) Vision is holding okay Just left eye  No chest pain Stable DOE--like going up stairs.  No dizziness or syncope  Slow urine stream--may be some better Daytime frequency and some urgency Nocturia stable x 2  Current Outpatient Prescriptions on File Prior to Visit  Medication Sig Dispense Refill  . aspirin 81 MG tablet Take 81 mg by mouth daily.      . Multiple Vitamins-Minerals (CENTRUM SILVER ULTRA MENS) TABS Take 1 tablet by mouth daily.    . Naproxen Sodium (ALEVE) 220 MG CAPS Take 1-2 capsules by mouth as needed.    . potassium chloride (K-DUR) 10 MEQ tablet TAKE 1 TABLET (10 MEQ TOTAL) BY MOUTH 2 (TWO) TIMES DAILY. 180 tablet 3  . triamterene-hydrochlorothiazide (DYAZIDE) 37.5-25 MG per capsule TAKE 1 CAPSULE  BY MOUTH EVERY MORNING. 90 capsule 3   No current facility-administered medications on file prior to visit.    No Known Allergies  Past Medical History  Diagnosis Date  . Colon polyps   . Diverticulosis of colon   . Hypertension   . Angiodysplasia 02/13/2006  . Benign prostatic hypertrophy   . Obesity   . Macular degeneration, wet (HCC)    Dr Chyrl CivatteApenzeller--left  . Peripheral neuropathy [facility](HCC)     Past Surgical History  Procedure Laterality Date  . Mastoid debridement    . Appendectomy    . Tonsillectomy    . Pilonidal cyst excision  1960  . Knee arthroscopy      BOTH KNEES    Family History  Problem Relation Age of Onset  . Cancer Mother   . Obesity Sister   . Diabetes Sister   . Diabetes Maternal Uncle     Social History   Social History  . Marital Status: Widowed    Spouse Name: N/A  . Number of Children: 3  . Years of Education: N/A   Occupational History  . 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 Smoking status: Former Smoker    Types: Cigarettes    Quit date: 05/17/1983  . Smokeless tobacco: Never Used  . Alcohol Use: 0.0 oz/week    0 Standard drinks or equivalent per week     Comment: DRINK EVERYDAY  . Drug Use: No  . Sexual Activity: Not on file   Other Topics Concern  . Not on file   Social History Narrative   Has living will    Son Algernon HuxleyGlen is health care POA   Would accept resuscitation but no prolonged artificial life support   No tube feeds if cognitively unaware   Review of Systems Has lost 30#---increased his exercise Knees feel better with the decreased  weight Appetite is good Feels good enough to consider [employer] driving trip again---worried about high altitude areas (wonders about portable oxygen if he goes---I can prescribe) Sleeps okay Bowels fine Wears seat belt sporadically--discussed  Teeth have needed work---but keeps up No rash or suspicious spots    Objective:   Physical Exam  Constitutional: He is oriented to person, place, and time. He appears well-developed and well-nourished. No distress.  HENT:  Mouth/Throat: Oropharynx is clear and moist. No oropharyngeal exudate.  Neck: Normal range of motion. Neck supple. No thyromegaly present.  Cardiovascular: Normal rate, regular rhythm, normal heart sounds and  intact distal pulses.  Exam reveals no gallop.   No murmur heard. Pulmonary/Chest: Effort normal and breath sounds normal. No respiratory distress. He has no wheezes. He has no rales.  Abdominal: Soft. There is no tenderness.  Musculoskeletal: He exhibits no tenderness.  Thick calves and ankles but no pitting  Lymphadenopathy:    He has no cervical adenopathy.  Neurological: He is alert and oriented to person, place, and time.  President-- "B[place], Bush" [phone] D-l-r-o-w Recall 3/3  Skin: No rash noted.  Psychiatric: He has a normal mood and affect. His behavior is normal.          Assessment & Plan:

## 2015-09-09 NOTE — Assessment & Plan Note (Signed)
See social history 

## 2015-09-09 NOTE — Assessment & Plan Note (Signed)
Ongoing numbness but no pain No Rx Discussed safety

## 2015-09-09 NOTE — Assessment & Plan Note (Addendum)
BP Readings from Last 3 Encounters:  09/09/15 130/88  08/26/14 148/58  08/28/13 140/80   Good control now Using med about every other day

## 2015-09-10 ENCOUNTER — Other Ambulatory Visit: Payer: Self-pay | Admitting: Internal Medicine

## 2015-09-10 DIAGNOSIS — D751 Secondary polycythemia: Secondary | ICD-10-CM

## 2015-09-18 DIAGNOSIS — H353222 Exudative age-related macular degeneration, left eye, with inactive choroidal neovascularization: Secondary | ICD-10-CM | POA: Diagnosis not present

## 2015-10-02 ENCOUNTER — Other Ambulatory Visit (INDEPENDENT_AMBULATORY_CARE_PROVIDER_SITE_OTHER): Payer: Medicare Other

## 2015-10-02 ENCOUNTER — Other Ambulatory Visit: Payer: Medicare Other

## 2015-10-02 DIAGNOSIS — D750 Familial erythrocytosis: Secondary | ICD-10-CM | POA: Diagnosis not present

## 2015-10-02 DIAGNOSIS — D751 Secondary polycythemia: Secondary | ICD-10-CM | POA: Diagnosis not present

## 2015-10-02 LAB — CBC WITH DIFFERENTIAL/PLATELET
Basophils Absolute: 86 cells/uL (ref 0–200)
Basophils Relative: 1 %
EOS PCT: 3 %
Eosinophils Absolute: 258 cells/uL (ref 15–500)
HCT: 50.2 % — ABNORMAL HIGH (ref 38.5–50.0)
Hemoglobin: 17.3 g/dL — ABNORMAL HIGH (ref 13.2–17.1)
LYMPHS PCT: 34 %
Lymphs Abs: 2924 cells/uL (ref 850–3900)
MCH: 33 pg (ref 27.0–33.0)
MCHC: 34.5 g/dL (ref 32.0–36.0)
MCV: 95.6 fL (ref 80.0–100.0)
MPV: 11 fL (ref 7.5–12.5)
Monocytes Absolute: 860 cells/uL (ref 200–950)
Monocytes Relative: 10 %
NEUTROS PCT: 52 %
Neutro Abs: 4472 cells/uL (ref 1500–7800)
Platelets: 194 10*3/uL (ref 140–400)
RBC: 5.25 MIL/uL (ref 4.20–5.80)
RDW: 14.4 % (ref 11.0–15.0)
WBC: 8.6 10*3/uL (ref 3.8–10.8)

## 2015-10-03 LAB — IRON,TIBC AND FERRITIN PANEL
%SAT: 42 % (ref 15–60)
FERRITIN: 53 ng/mL (ref 20–380)
IRON: 143 ug/dL (ref 50–180)
TIBC: 340 ug/dL (ref 250–425)

## 2015-10-05 ENCOUNTER — Encounter: Payer: Self-pay | Admitting: Podiatry

## 2015-10-05 ENCOUNTER — Ambulatory Visit (INDEPENDENT_AMBULATORY_CARE_PROVIDER_SITE_OTHER): Payer: Medicare Other | Admitting: Podiatry

## 2015-10-05 DIAGNOSIS — M79676 Pain in unspecified toe(s): Secondary | ICD-10-CM | POA: Diagnosis not present

## 2015-10-05 DIAGNOSIS — B351 Tinea unguium: Secondary | ICD-10-CM

## 2015-10-05 NOTE — Progress Notes (Signed)
As a diabetic he presents today for follow-up of his painful elongated toenails.  Objective: His toenails are thick yellow dystrophic onychomycotic pulses remain palpable with no open lesions.  Assessment: Pain in limb secondary to onychomycosis. Diabetes mellitus with diabetic peripheral neuropathy.  Plan: Debridement of toenails 1 through 5 bilateral covered service secondary to pain and neuropathy. Follow up with him in 2-4 months.

## 2015-10-28 DIAGNOSIS — H353222 Exudative age-related macular degeneration, left eye, with inactive choroidal neovascularization: Secondary | ICD-10-CM | POA: Diagnosis not present

## 2015-12-07 ENCOUNTER — Ambulatory Visit: Payer: Medicare Other | Admitting: Podiatry

## 2015-12-10 ENCOUNTER — Other Ambulatory Visit: Payer: Self-pay

## 2015-12-10 MED ORDER — POTASSIUM CHLORIDE ER 10 MEQ PO TBCR: EXTENDED_RELEASE_TABLET | ORAL | 2 refills | Status: DC

## 2015-12-10 MED ORDER — TRIAMTERENE-HCTZ 37.5-25 MG PO CAPS: ORAL_CAPSULE | ORAL | 2 refills | Status: DC

## 2015-12-10 NOTE — Telephone Encounter (Signed)
Pt request refill tramterene HCTZ and Kdur to [facility] [place]. Refilled per protocol; annual 08/2015. Pt voiced understanding and will ck with pharmacy.

## 2015-12-16 DIAGNOSIS — H353221 Exudative age-related macular degeneration, left eye, with active choroidal neovascularization: Secondary | ICD-10-CM | POA: Diagnosis not present

## 2015-12-28 ENCOUNTER — Ambulatory Visit (INDEPENDENT_AMBULATORY_CARE_PROVIDER_SITE_OTHER): Payer: Medicare Other | Admitting: Podiatry

## 2015-12-28 ENCOUNTER — Encounter: Payer: Self-pay | Admitting: Podiatry

## 2015-12-28 DIAGNOSIS — M79676 Pain in unspecified toe(s): Secondary | ICD-10-CM

## 2015-12-28 DIAGNOSIS — B351 Tinea unguium: Secondary | ICD-10-CM | POA: Diagnosis not present

## 2015-12-28 NOTE — Progress Notes (Signed)
He presents today for chief complaint of painful elongated toenails.  Objective: Vital signs are stable he is alert and oriented 3. Pulses are palpable. Toenails are thick yellow dystrophic onychomycotic and painful palpation.  Assessment: Pain in limb secondary to onychomycosis 1 through 5 bilateral.  Plan: Debridement of toenails 1 through 5 bilateral. Follow up with him in 3 months

## 2016-02-03 DIAGNOSIS — H353222 Exudative age-related macular degeneration, left eye, with inactive choroidal neovascularization: Secondary | ICD-10-CM | POA: Diagnosis not present

## 2016-03-30 ENCOUNTER — Ambulatory Visit: Payer: Medicare Other | Admitting: Podiatry

## 2016-04-01 DIAGNOSIS — H353222 Exudative age-related macular degeneration, left eye, with inactive choroidal neovascularization: Secondary | ICD-10-CM | POA: Diagnosis not present

## 2016-04-11 ENCOUNTER — Ambulatory Visit (INDEPENDENT_AMBULATORY_CARE_PROVIDER_SITE_OTHER): Payer: Medicare Other | Admitting: Podiatry

## 2016-04-11 ENCOUNTER — Encounter: Payer: Self-pay | Admitting: Podiatry

## 2016-04-11 ENCOUNTER — Ambulatory Visit: Payer: Medicare Other | Admitting: Podiatry

## 2016-04-11 VITALS — Ht 68.0 in | Wt 329.0 lb

## 2016-04-11 DIAGNOSIS — B351 Tinea unguium: Secondary | ICD-10-CM | POA: Diagnosis not present

## 2016-04-11 DIAGNOSIS — M79676 Pain in unspecified toe(s): Secondary | ICD-10-CM | POA: Diagnosis not present

## 2016-04-11 NOTE — Progress Notes (Signed)
Complaint:  Visit Type: Patient returns to my office for continued preventative foot care services. Complaint: Patient states" my nails have grown long and thick and become painful to walk and wear shoes" Patient has been diagnosed pre-diabetic with neuropathy. The patient presents for preventative foot care services. No changes to ROS  Podiatric Exam: Vascular: dorsalis pedis and posterior tibial pulses are palpable bilateral. Capillary return is immediate. Temperature gradient is WNL. Skin turgor WNL  Sensorium: Normal Semmes Weinstein monofilament test. Normal tactile sensation bilaterally. Nail Exam: Pt has thick disfigured discolored nails with subungual debris noted bilateral entire nail hallux through fifth toenails Ulcer Exam: There is no evidence of ulcer or pre-ulcerative changes or infection. Orthopedic Exam: Muscle tone and strength are WNL. No limitations in general ROM. No crepitus or effusions noted. Foot type and digits show no abnormalities. Bony prominences are unremarkable. Skin: No Porokeratosis. No infection or ulcers  Diagnosis:  Onychomycosis, , Pain in right toe, pain in left toes  Treatment & Plan Procedures and Treatment: Consent by patient was obtained for treatment procedures. The patient understood the discussion of treatment and procedures well. All questions were answered thoroughly reviewed. Debridement of mycotic and hypertrophic toenails, 1 through 5 bilateral and clearing of subungual debris. No ulceration, no infection noted.  Return Visit-Office Procedure: Patient instructed to return to the office for a follow up visit 3 months for continued evaluation and treatment.    Helane GuntherGregory Tamyia Minich DPM

## 2016-04-18 ENCOUNTER — Ambulatory Visit: Payer: Medicare Other | Admitting: Podiatry

## 2016-06-03 DIAGNOSIS — H353221 Exudative age-related macular degeneration, left eye, with active choroidal neovascularization: Secondary | ICD-10-CM | POA: Diagnosis not present

## 2016-07-11 ENCOUNTER — Ambulatory Visit (INDEPENDENT_AMBULATORY_CARE_PROVIDER_SITE_OTHER): Payer: Medicare Other | Admitting: Podiatry

## 2016-07-11 ENCOUNTER — Encounter: Payer: Self-pay | Admitting: Podiatry

## 2016-07-11 DIAGNOSIS — M79676 Pain in unspecified toe(s): Secondary | ICD-10-CM | POA: Diagnosis not present

## 2016-07-11 DIAGNOSIS — G588 Other specified mononeuropathies: Secondary | ICD-10-CM

## 2016-07-11 DIAGNOSIS — B351 Tinea unguium: Secondary | ICD-10-CM

## 2016-07-11 NOTE — Progress Notes (Signed)
Complaint:  Visit Type: Patient returns to my office for continued preventative foot care services. Complaint: Patient states" my nails have grown long and thick and become painful to walk and wear shoes" Patient has been diagnosed pre-diabetic with neuropathy. The patient presents for preventative foot care services. No changes to ROS  Podiatric Exam: Vascular: dorsalis pedis and posterior tibial pulses are palpable bilateral. Capillary return is immediate. Temperature gradient is WNL. Skin turgor WNL  Sensorium: Diminished  Semmes Weinstein monofilament test. Normal tactile sensation bilaterally. Nail Exam: Pt has thick disfigured discolored nails with subungual debris noted bilateral entire nail hallux through fifth toenails Ulcer Exam: There is no evidence of ulcer or pre-ulcerative changes or infection. Orthopedic Exam: Muscle tone and strength are WNL. No limitations in general ROM. No crepitus or effusions noted. Foot type and digits show no abnormalities. Bony prominences are unremarkable. Skin: No Porokeratosis. No infection or ulcers  Diagnosis:  Onychomycosis, , Pain in right toe, pain in left toes  Treatment & Plan Procedures and Treatment: Consent by patient was obtained for treatment procedures. The patient understood the discussion of treatment and procedures well. All questions were answered thoroughly reviewed. Debridement of mycotic and hypertrophic toenails, 1 through 5 bilateral and clearing of subungual debris. No ulceration, no infection noted.  Return Visit-Office Procedure: Patient instructed to return to the office for a follow up visit 3 months for continued evaluation and treatment.    Helane GuntherGregory Onelia Cadmus DPM

## 2016-07-29 DIAGNOSIS — H353221 Exudative age-related macular degeneration, left eye, with active choroidal neovascularization: Secondary | ICD-10-CM | POA: Diagnosis not present

## 2016-09-09 ENCOUNTER — Encounter: Payer: Medicare Other | Admitting: Internal Medicine

## 2016-09-14 ENCOUNTER — Encounter: Payer: Self-pay | Admitting: Internal Medicine

## 2016-09-14 ENCOUNTER — Ambulatory Visit (INDEPENDENT_AMBULATORY_CARE_PROVIDER_SITE_OTHER): Payer: Medicare Other | Admitting: Internal Medicine

## 2016-09-14 VITALS — BP 146/86 | HR 88 | Temp 97.6°F | Ht 68.0 in | Wt 324.0 lb

## 2016-09-14 DIAGNOSIS — I499 Cardiac arrhythmia, unspecified: Secondary | ICD-10-CM | POA: Diagnosis not present

## 2016-09-14 DIAGNOSIS — H353220 Exudative age-related macular degeneration, left eye, stage unspecified: Secondary | ICD-10-CM | POA: Diagnosis not present

## 2016-09-14 DIAGNOSIS — Z6841 Body Mass Index (BMI) 40.0 and over, adult: Secondary | ICD-10-CM | POA: Diagnosis not present

## 2016-09-14 DIAGNOSIS — N138 Other obstructive and reflux uropathy: Secondary | ICD-10-CM | POA: Diagnosis not present

## 2016-09-14 DIAGNOSIS — G629 Polyneuropathy, unspecified: Secondary | ICD-10-CM | POA: Diagnosis not present

## 2016-09-14 DIAGNOSIS — Z0000 Encounter for general adult medical examination without abnormal findings: Secondary | ICD-10-CM | POA: Diagnosis not present

## 2016-09-14 DIAGNOSIS — I10 Essential (primary) hypertension: Secondary | ICD-10-CM | POA: Diagnosis not present

## 2016-09-14 DIAGNOSIS — N401 Enlarged prostate with lower urinary tract symptoms: Secondary | ICD-10-CM

## 2016-09-14 LAB — COMPREHENSIVE METABOLIC PANEL
ALBUMIN: 3.9 g/dL (ref 3.5–5.2)
ALK PHOS: 55 U/L (ref 39–117)
ALT: 17 U/L (ref 0–53)
AST: 20 U/L (ref 0–37)
BILIRUBIN TOTAL: 0.9 mg/dL (ref 0.2–1.2)
BUN: 11 mg/dL (ref 6–23)
CALCIUM: 9.6 mg/dL (ref 8.4–10.5)
CO2: 31 mEq/L (ref 19–32)
CREATININE: 0.84 mg/dL (ref 0.40–1.50)
Chloride: 101 mEq/L (ref 96–112)
GFR: 93.47 mL/min (ref >60.00)
GLUCOSE: 113 mg/dL — AB (ref 70–99)
POTASSIUM: 3.8 meq/L (ref 3.5–5.1)
Sodium: 138 mEq/L (ref 135–145)
Total Protein: 6.8 g/dL (ref 6.0–8.3)

## 2016-09-14 LAB — CBC WITH DIFFERENTIAL/PLATELET
BASOS ABS: 0 10*3/uL (ref 0.0–0.1)
Basophils Relative: 0.6 % (ref 0.0–3.0)
EOS ABS: 0.2 10*3/uL (ref 0.0–0.7)
Eosinophils Relative: 3.9 % (ref 0.0–5.0)
HCT: 52.1 % — ABNORMAL HIGH (ref 39.0–52.0)
Hemoglobin: 17.8 g/dL — ABNORMAL HIGH (ref 13.0–17.0)
LYMPHS ABS: 2.4 10*3/uL (ref 0.7–4.0)
Lymphocytes Relative: 37.2 % (ref 12.0–46.0)
MCHC: 34.2 g/dL (ref 30.0–36.0)
MCV: 100.3 fl — ABNORMAL HIGH (ref 78.0–100.0)
MONO ABS: 0.6 10*3/uL (ref 0.1–1.0)
MONOS PCT: 9.9 % (ref 3.0–12.0)
NEUTROS PCT: 48.4 % (ref 43.0–77.0)
Neutro Abs: 3.1 10*3/uL (ref 1.4–7.7)
Platelets: 189 10*3/uL (ref 150.0–400.0)
RBC: 5.19 Mil/uL (ref 4.22–5.81)
RDW: 13.5 % (ref 11.5–15.5)
WBC: 6.4 10*3/uL (ref 4.0–10.5)

## 2016-09-14 LAB — LIPID PANEL
CHOLESTEROL: 194 mg/dL (ref 0–200)
HDL: 52.2 mg/dL (ref >39.00)
LDL Cholesterol: 111 mg/dL — ABNORMAL HIGH (ref 0–99)
NonHDL: 141.3
TRIGLYCERIDES: 153 mg/dL — AB (ref 0.0–149.0)
Total CHOL/HDL Ratio: 4
VLDL: 30.6 mg/dL (ref 0.0–40.0)

## 2016-09-14 LAB — T4, FREE: FREE T4: 0.74 ng/dL (ref 0.60–1.60)

## 2016-09-14 MED ORDER — TRIAMTERENE-HCTZ 37.5-25 MG PO CAPS: ORAL_CAPSULE | ORAL | 2 refills | Status: DC

## 2016-09-14 MED ORDER — POTASSIUM CHLORIDE ER 10 MEQ PO TBCR: EXTENDED_RELEASE_TABLET | ORAL | 2 refills | Status: DC

## 2016-09-14 MED ORDER — TAMSULOSIN HCL 0.4 MG PO CAPS: 0.4000 mg | ORAL_CAPSULE | Freq: Every day | ORAL | 3 refills | Status: DC

## 2016-09-14 NOTE — Assessment & Plan Note (Signed)
Has been able to have slow weight loss

## 2016-09-14 NOTE — Assessment & Plan Note (Addendum)
Will check EKG Computer calls it sinus due to some inconsistent p waves but I am concerned about atrial fibrillation Sent to cardiologist for review If a fib--will start eliquis and check echo. Not sure about cardiology evaluation

## 2016-09-14 NOTE — Progress Notes (Signed)
Subjective:    Patient ID: Brad Reed, male    DOB: 02/28/1935, 82 y.o.   MRN: [id number]  HPI Here for Medicare wellness and follow up of chronic health conditions Reviewed form and advanced directives Reviewed other doctors Still enjoys 1-2 drinks daily  No tobacco Still not really exercising--plans to restart golf Did fall while at rest stop--- mat was folded over and he bent down to fix it, leaned on post and went down. No injury Hearing not great---but he doesn't want hearing aide No depression or anhedonia. Some bad days if more pain--but nothing of concern Still lives with sister---independent with instrumental ADLs (they share) No memory problems of note  No new concerns Chronic issues about his weight  Feet hurt--- keeps up with podiatrist Cuts nails---checks circulation Ongoing neuropathy Still doesn't feel the cane helps him--would consider rollator Uses aleve--feels this helps the neuropathy pain  Notices it at night  Still has nocturia x 3-4  No daytime problems Didn't feel doxazosin helped Feels he would like something for this  Still gets avastin injections in left eye every 7 weeks Dr Brooke Dare at [facility] is still okay  No chest pain No SOB No dizziness or syncope Mild edema No palpitations  Current Outpatient Prescriptions on File Prior to Visit  Medication Sig Dispense Refill  . aspirin 81 MG tablet Take 81 mg by mouth daily.      . Naproxen Sodium (ALEVE) 220 MG CAPS Take 1-2 capsules by mouth as needed.    . potassium chloride (K-DUR) 10 MEQ tablet TAKE 1 TABLET (10 MEQ TOTAL) BY MOUTH 2 (TWO) TIMES DAILY. 180 tablet 2  . triamterene-hydrochlorothiazide (DYAZIDE) 37.5-25 MG capsule TAKE 1 CAPSULE  BY MOUTH EVERY MORNING. 90 capsule 2   No current facility-administered medications on file prior to visit.     No Known Allergies  Past Medical History:  Diagnosis Date  . Angiodysplasia 02/13/2006  . Benign prostatic hypertrophy   .  Colon polyps   . Diverticulosis of colon   . Hypertension   . Macular degeneration, wet (HCC)    Dr Chyrl Civatte  . Obesity   . Peripheral neuropathy     Past Surgical History:  Procedure Laterality Date  . APPENDECTOMY    . KNEE ARTHROSCOPY     BOTH KNEES  . MASTOID DEBRIDEMENT    . PILONIDAL CYST EXCISION  1960  . TONSILLECTOMY      Family History  Problem Relation Age of Onset  . Cancer Mother   . Obesity Sister   . Diabetes Sister   . Diabetes Maternal Uncle     Social History   Social History  . Marital status: Widowed    Spouse name: N/A  . Number of children: 3  . Years of education: N/A   Occupational History  . 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 Smoking status: Former Smoker    Types: Cigarettes    Quit date: 05/17/1983  . Smokeless tobacco: Never Used  . Alcohol use 0.0 oz/week     Comment: DRINK EVERYDAY  . Drug use: No  . Sexual activity: Not on file   Other Topics Concern  . Not on file   Social History Narrative   Has living will    Son [name] is health care POA   Would accept resuscitation but no prolonged artificial life support   No tube feeds if cognitively  unaware   Review of Systems Weight is down 5# from last year Appetite is fine Sleeps okay--but some trouble reinitiating after nocturia Wears seat belt sporadically Teeth are okay-- not happy with last dentist Bowels are fine--no blood Some knee pain at times No rash or suspicious skin lesions. Dry patch on left flank--aveeno helps No heartburn or dysphagia    Objective:   Physical Exam  Constitutional: He is oriented to person, place, and time. He appears well-nourished. No distress.  HENT:  Mouth/Throat: Oropharynx is clear and moist. No oropharyngeal exudate.  Neck: No thyromegaly present.  Cardiovascular: Normal rate and intact distal pulses.  Exam reveals no gallop.   No murmur heard. Slightly irregular    Pulmonary/Chest: Effort normal and breath sounds normal. No respiratory distress. He has no wheezes. He has no rales.  Abdominal: Soft. There is no tenderness.  Musculoskeletal: He exhibits no tenderness.  Thick calves but no pitting  Lymphadenopathy:    He has no cervical adenopathy.  Neurological: He is alert and oriented to person, place, and time.  President--- "[place], [place]" [id number] Recall 3/3  Skin: No rash noted. No erythema.  Psychiatric: He has a normal mood and affect. His behavior is normal.          Assessment & Plan:

## 2016-09-14 NOTE — Assessment & Plan Note (Signed)
Some pain but not ready for meds

## 2016-09-14 NOTE — Assessment & Plan Note (Signed)
I have personally reviewed the Medicare Annual Wellness questionnaire and have noted 1. The patient's medical and social history 2. Their use of alcohol, tobacco or illicit drugs 3. Their current medications and supplements 4. The patient's functional ability including ADL's, fall risks, home safety risks and hearing or visual             impairment. 5. Diet and physical activities 6. Evidence for depression or mood disorders  The patients weight, height, BMI and visual acuity have been recorded in the chart I have made referrals, counseling and provided education to the patient based review of the above and I have provided the pt with a written personalized care plan for preventive services.  I have provided you with a copy of your personalized plan for preventive services. Please take the time to review along with your updated medication list.  Discussed exercise Will wait for Td No cancer screening due to age Will consider shingrix

## 2016-09-14 NOTE — Assessment & Plan Note (Signed)
BP Readings from Last 3 Encounters:  09/14/16 (!) 146/86  09/09/15 130/88  08/26/14 (!) 148/58   Didn't take meds this morning Probably okay

## 2016-09-14 NOTE — Assessment & Plan Note (Signed)
Increased symptoms  Will try tamsulosin 

## 2016-09-14 NOTE — Assessment & Plan Note (Signed)
Continues on avastin for one eye

## 2016-09-15 ENCOUNTER — Telehealth: Payer: Self-pay

## 2016-09-15 NOTE — Progress Notes (Signed)
I think it looks like NSR with APCs Maybe some sinus arrhythmia Baseline artifact. Less likely atrial fib thx TG

## 2016-09-15 NOTE — Telephone Encounter (Signed)
PCP Dr. Alphonsus SiasLetvak calling to get feedback on ekg done at his office yesterday ? afib my need eliquis    Please review and advise  Can send response via epic

## 2016-09-15 NOTE — Telephone Encounter (Signed)
Dr Mariah MillingGollan reviewed and sent message to Dr Alphonsus SiasLetvak.

## 2016-09-15 NOTE — Progress Notes (Signed)
Please call patient The cardiologist doesn't think this is atrial fibrillation, so no further action is needed. This is great news!

## 2016-09-15 NOTE — Telephone Encounter (Signed)
Spoke with Dr Mariah MillingGollan. He will review the EKG in EPIC and then advise.

## 2016-09-16 ENCOUNTER — Telehealth: Payer: Self-pay

## 2016-09-16 NOTE — Telephone Encounter (Signed)
I spoke to pt about his EKG Results per Dr Alphonsus SiasLetvak. Everything looked good after consulting with Dr Mariah MillingGollan.

## 2016-09-30 DIAGNOSIS — H353221 Exudative age-related macular degeneration, left eye, with active choroidal neovascularization: Secondary | ICD-10-CM | POA: Diagnosis not present

## 2016-09-30 DIAGNOSIS — H2513 Age-related nuclear cataract, bilateral: Secondary | ICD-10-CM | POA: Diagnosis not present

## 2016-10-17 ENCOUNTER — Ambulatory Visit (INDEPENDENT_AMBULATORY_CARE_PROVIDER_SITE_OTHER): Payer: Medicare Other | Admitting: Podiatry

## 2016-10-17 DIAGNOSIS — M79676 Pain in unspecified toe(s): Secondary | ICD-10-CM | POA: Diagnosis not present

## 2016-10-17 DIAGNOSIS — B351 Tinea unguium: Secondary | ICD-10-CM

## 2016-10-17 NOTE — Progress Notes (Signed)
Complaint:  Visit Type: Patient returns to my office for continued preventative foot care services. Complaint: Patient states" my nails have grown long and thick and become painful to walk and wear shoes" Patient has been diagnosed pre-diabetic with neuropathy. The patient presents for preventative foot care services. No changes to ROS  Podiatric Exam: Vascular: dorsalis pedis and posterior tibial pulses are palpable bilateral. Capillary return is immediate. Temperature gradient is WNL. Skin turgor WNL  Sensorium: Diminished  Semmes Weinstein monofilament test. Normal tactile sensation bilaterally. Nail Exam: Pt has thick disfigured discolored nails with subungual debris noted bilateral entire nail hallux through fifth toenails Ulcer Exam: There is no evidence of ulcer or pre-ulcerative changes or infection. Orthopedic Exam: Muscle tone and strength are WNL. No limitations in general ROM. No crepitus or effusions noted. Foot type and digits show no abnormalities. Bony prominences are unremarkable. Skin: No Porokeratosis. No infection or ulcers  Diagnosis:  Onychomycosis, , Pain in right toe, pain in left toes  Treatment & Plan Procedures and Treatment: Consent by patient was obtained for treatment procedures. The patient understood the discussion of treatment and procedures well. All questions were answered thoroughly reviewed. Debridement of mycotic and hypertrophic toenails, 1 through 5 bilateral and clearing of subungual debris. No ulceration, no infection noted.  Return Visit-Office Procedure: Patient instructed to return to the office for a follow up visit 3 months for continued evaluation and treatment.    Helane GuntherGregory Marleah Beever DPM

## 2016-12-05 DIAGNOSIS — H353221 Exudative age-related macular degeneration, left eye, with active choroidal neovascularization: Secondary | ICD-10-CM | POA: Diagnosis not present

## 2017-01-23 ENCOUNTER — Ambulatory Visit (INDEPENDENT_AMBULATORY_CARE_PROVIDER_SITE_OTHER): Payer: Medicare Other | Admitting: Podiatry

## 2017-01-23 ENCOUNTER — Encounter: Payer: Self-pay | Admitting: Podiatry

## 2017-01-23 DIAGNOSIS — B351 Tinea unguium: Secondary | ICD-10-CM | POA: Diagnosis not present

## 2017-01-23 DIAGNOSIS — M79676 Pain in unspecified toe(s): Secondary | ICD-10-CM

## 2017-01-23 DIAGNOSIS — G588 Other specified mononeuropathies: Secondary | ICD-10-CM

## 2017-01-23 NOTE — Progress Notes (Signed)
Complaint:  Visit Type: Patient returns to my office for continued preventative foot care services. Complaint: Patient states" my nails have grown long and thick and become painful to walk and wear shoes" Patient has been diagnosed pre-diabetic with neuropathy. The patient presents for preventative foot care services. No changes to ROS  Podiatric Exam: Vascular: dorsalis pedis  pulses are palpable bilateral.  PT pulses are non palpable  B/L.  Excessive swelling  B/L   Capillary return is immediate. Temperature gradient is WNL. Skin turgor WNL  Sensorium: Diminished  Semmes Weinstein monofilament test. Normal tactile sensation bilaterally. Nail Exam: Pt has thick disfigured discolored nails with subungual debris noted bilateral entire nail hallux through fifth toenails Ulcer Exam: There is no evidence of ulcer or pre-ulcerative changes or infection. Orthopedic Exam: Muscle tone and strength are WNL. No limitations in general ROM. No crepitus or effusions noted. Foot type and digits show no abnormalities. Bony prominences are unremarkable. Skin: No Porokeratosis. No infection or ulcers  Diagnosis:  Onychomycosis, , Pain in right toe, pain in left toes  Treatment & Plan Procedures and Treatment: Consent by patient was obtained for treatment procedures. The patient understood the discussion of treatment and procedures well. All questions were answered thoroughly reviewed. Debridement of mycotic and hypertrophic toenails, 1 through 5 bilateral and clearing of subungual debris. No ulceration, no infection noted.  Return Visit-Office Procedure: Patient instructed to return to the office for a follow up visit 3 months for continued evaluation and treatment.    Oaklen Thiam DPM 

## 2017-02-06 DIAGNOSIS — H353221 Exudative age-related macular degeneration, left eye, with active choroidal neovascularization: Secondary | ICD-10-CM | POA: Diagnosis not present

## 2017-03-27 DIAGNOSIS — H353221 Exudative age-related macular degeneration, left eye, with active choroidal neovascularization: Secondary | ICD-10-CM | POA: Diagnosis not present

## 2017-04-24 ENCOUNTER — Ambulatory Visit: Payer: Medicare Other | Admitting: Podiatry

## 2017-04-27 ENCOUNTER — Ambulatory Visit (INDEPENDENT_AMBULATORY_CARE_PROVIDER_SITE_OTHER): Payer: Medicare Other | Admitting: Podiatry

## 2017-04-27 ENCOUNTER — Encounter: Payer: Self-pay | Admitting: Podiatry

## 2017-04-27 DIAGNOSIS — B351 Tinea unguium: Secondary | ICD-10-CM | POA: Diagnosis not present

## 2017-04-27 DIAGNOSIS — M79676 Pain in unspecified toe(s): Secondary | ICD-10-CM

## 2017-04-27 DIAGNOSIS — G629 Polyneuropathy, unspecified: Secondary | ICD-10-CM

## 2017-04-27 NOTE — Progress Notes (Signed)
Complaint:  Visit Type: Patient returns to my office for continued preventative foot care services. Complaint: Patient states" my nails have grown long and thick and become painful to walk and wear shoes" Patient has been diagnosed pre-diabetic with neuropathy. The patient presents for preventative foot care services. No changes to ROS  Podiatric Exam: Vascular: dorsalis pedis  pulses are palpable bilateral.  PT pulses are non palpable  B/L.  Excessive swelling  B/L   Capillary return is immediate. Temperature gradient is WNL. Skin turgor WNL  Sensorium: Diminished  Semmes Weinstein monofilament test. Normal tactile sensation bilaterally. Nail Exam: Pt has thick disfigured discolored nails with subungual debris noted bilateral entire nail hallux through fifth toenails Ulcer Exam: There is no evidence of ulcer or pre-ulcerative changes or infection. Orthopedic Exam: Muscle tone and strength are WNL. No limitations in general ROM. No crepitus or effusions noted. Foot type and digits show no abnormalities. Bony prominences are unremarkable. Skin: No Porokeratosis. No infection or ulcers  Diagnosis:  Onychomycosis, , Pain in right toe, pain in left toes  Treatment & Plan Procedures and Treatment: Consent by patient was obtained for treatment procedures. The patient understood the discussion of treatment and procedures well. All questions were answered thoroughly reviewed. Debridement of mycotic and hypertrophic toenails, 1 through 5 bilateral and clearing of subungual debris. No ulceration, no infection noted.  Return Visit-Office Procedure: Patient instructed to return to the office for a follow up visit 3 months for continued evaluation and treatment.    Tyaira Heward DPM 

## 2017-05-15 DIAGNOSIS — H353221 Exudative age-related macular degeneration, left eye, with active choroidal neovascularization: Secondary | ICD-10-CM | POA: Diagnosis not present

## 2017-07-03 DIAGNOSIS — H353221 Exudative age-related macular degeneration, left eye, with active choroidal neovascularization: Secondary | ICD-10-CM | POA: Diagnosis not present

## 2017-07-27 ENCOUNTER — Ambulatory Visit (INDEPENDENT_AMBULATORY_CARE_PROVIDER_SITE_OTHER): Payer: Medicare Other | Admitting: Podiatry

## 2017-07-27 ENCOUNTER — Encounter: Payer: Self-pay | Admitting: Podiatry

## 2017-07-27 DIAGNOSIS — B351 Tinea unguium: Secondary | ICD-10-CM

## 2017-07-27 DIAGNOSIS — M79676 Pain in unspecified toe(s): Secondary | ICD-10-CM | POA: Diagnosis not present

## 2017-07-27 DIAGNOSIS — G629 Polyneuropathy, unspecified: Secondary | ICD-10-CM

## 2017-07-27 NOTE — Progress Notes (Signed)
Complaint:  Visit Type: Patient returns to my office for continued preventative foot care services. Complaint: Patient states" my nails have grown long and thick and become painful to walk and wear shoes" Patient has been diagnosed pre-diabetic with neuropathy. The patient presents for preventative foot care services. No changes to ROS  Podiatric Exam: Vascular: dorsalis pedis  pulses are palpable bilateral.  PT pulses are non palpable  B/L.  Excessive swelling  B/L   Capillary return is immediate. Temperature gradient is WNL. Skin turgor WNL  Sensorium: Diminished  Semmes Weinstein monofilament test. Normal tactile sensation bilaterally. Nail Exam: Pt has thick disfigured discolored nails with subungual debris noted bilateral entire nail hallux through fifth toenails Ulcer Exam: There is no evidence of ulcer or pre-ulcerative changes or infection. Orthopedic Exam: Muscle tone and strength are WNL. No limitations in general ROM. No crepitus or effusions noted. Foot type and digits show no abnormalities. Bony prominences are unremarkable. Skin: No Porokeratosis. No infection or ulcers  Diagnosis:  Onychomycosis, , Pain in right toe, pain in left toes  Treatment & Plan Procedures and Treatment: Consent by patient was obtained for treatment procedures. The patient understood the discussion of treatment and procedures well. All questions were answered thoroughly reviewed. Debridement of mycotic and hypertrophic toenails, 1 through 5 bilateral and clearing of subungual debris. No ulceration, no infection noted.  Return Visit-Office Procedure: Patient instructed to return to the office for a follow up visit 3 months for continued evaluation and treatment.    Clester Chlebowski DPM 

## 2017-08-21 DIAGNOSIS — H353221 Exudative age-related macular degeneration, left eye, with active choroidal neovascularization: Secondary | ICD-10-CM | POA: Diagnosis not present

## 2017-09-15 ENCOUNTER — Encounter: Payer: Self-pay | Admitting: Internal Medicine

## 2017-09-15 ENCOUNTER — Ambulatory Visit (INDEPENDENT_AMBULATORY_CARE_PROVIDER_SITE_OTHER): Payer: Medicare Other | Admitting: Internal Medicine

## 2017-09-15 VITALS — BP 140/88 | HR 84 | Temp 97.3°F | Ht 68.5 in | Wt 310.0 lb

## 2017-09-15 DIAGNOSIS — Z23 Encounter for immunization: Secondary | ICD-10-CM

## 2017-09-15 DIAGNOSIS — N138 Other obstructive and reflux uropathy: Secondary | ICD-10-CM

## 2017-09-15 DIAGNOSIS — H353220 Exudative age-related macular degeneration, left eye, stage unspecified: Secondary | ICD-10-CM | POA: Diagnosis not present

## 2017-09-15 DIAGNOSIS — Z0000 Encounter for general adult medical examination without abnormal findings: Secondary | ICD-10-CM | POA: Diagnosis not present

## 2017-09-15 DIAGNOSIS — G629 Polyneuropathy, unspecified: Secondary | ICD-10-CM | POA: Diagnosis not present

## 2017-09-15 DIAGNOSIS — Z7189 Other specified counseling: Secondary | ICD-10-CM | POA: Diagnosis not present

## 2017-09-15 DIAGNOSIS — I10 Essential (primary) hypertension: Secondary | ICD-10-CM | POA: Diagnosis not present

## 2017-09-15 DIAGNOSIS — R200 Anesthesia of skin: Secondary | ICD-10-CM | POA: Diagnosis not present

## 2017-09-15 DIAGNOSIS — N401 Enlarged prostate with lower urinary tract symptoms: Secondary | ICD-10-CM | POA: Diagnosis not present

## 2017-09-15 LAB — CBC
HCT: 51 % (ref 39.0–52.0)
Hemoglobin: 17.5 g/dL — ABNORMAL HIGH (ref 13.0–17.0)
MCHC: 34.4 g/dL (ref 30.0–36.0)
MCV: 100.3 fl — ABNORMAL HIGH (ref 78.0–100.0)
Platelets: 190 10*3/uL (ref 150.0–400.0)
RBC: 5.08 Mil/uL (ref 4.22–5.81)
RDW: 13.7 % (ref 11.5–15.5)
WBC: 7.7 10*3/uL (ref 4.0–10.5)

## 2017-09-15 LAB — COMPREHENSIVE METABOLIC PANEL
ALK PHOS: 58 U/L (ref 39–117)
ALT: 15 U/L (ref 0–53)
AST: 16 U/L (ref 0–37)
Albumin: 3.8 g/dL (ref 3.5–5.2)
BILIRUBIN TOTAL: 1.1 mg/dL (ref 0.2–1.2)
BUN: 10 mg/dL (ref 6–23)
CO2: 30 mEq/L (ref 19–32)
CREATININE: 0.99 mg/dL (ref 0.40–1.50)
Calcium: 9.8 mg/dL (ref 8.4–10.5)
Chloride: 99 mEq/L (ref 96–112)
GFR: 77.13 mL/min (ref >60.00)
Glucose, Bld: 105 mg/dL — ABNORMAL HIGH (ref 70–99)
Potassium: 4.2 mEq/L (ref 3.5–5.1)
Sodium: 140 mEq/L (ref 135–145)
TOTAL PROTEIN: 6.9 g/dL (ref 6.0–8.3)

## 2017-09-15 LAB — VITAMIN B12: VITAMIN B 12: 176 pg/mL — AB (ref 211–911)

## 2017-09-15 LAB — T4, FREE: Free T4: 0.89 ng/dL (ref 0.60–1.60)

## 2017-09-15 NOTE — Assessment & Plan Note (Signed)
Stable sensory changes

## 2017-09-15 NOTE — Progress Notes (Signed)
Subjective:    Patient ID: Brad Reed, male    DOB: 1935/03/24, 83 y.o.   MRN: [id number]  HPI Here for Medicare wellness visit and follow up of chronic medical conditions Reviewed form and advanced directives Reviewed other doctors Still enjoys 2 gin or vodka drinks a day No tobacco Not really exercising--will use rollator for stability if walking any distance Some vision changes Left ear is poor Independent with instrumental ADLs Fell once--minor injury No memory problems  Doing well Misses medications often---but takes it most of the times No chest pain No palpitations No dizziness or syncope Ankle edema has gone down  Has lost considerable weight Trying to avoid snacks  Daytime urinary frequency Nocturia x 2 Feels he empties well Doesn't take the tamsulosin  Ongoing knee pain Chiropractor helps  Keeps up with eye doctor avastin injections for the AMD  Still with neuropathy in feet Sensation loss--no pain  Current Outpatient Medications on File Prior to Visit  Medication Sig Dispense Refill  . Naproxen Sodium (ALEVE) 220 MG CAPS Take 1-2 capsules by mouth as needed.    . potassium chloride (K-DUR) 10 MEQ tablet TAKE 1 TABLET (10 MEQ TOTAL) BY MOUTH 2 (TWO) TIMES DAILY. 180 tablet 2  . triamterene-hydrochlorothiazide (DYAZIDE) 37.5-25 MG capsule TAKE 1 CAPSULE  BY MOUTH EVERY MORNING. 90 capsule 2   No current facility-administered medications on file prior to visit.     No Known Allergies  Past Medical History:  Diagnosis Date  . Angiodysplasia 02/13/2006  . Benign prostatic hypertrophy   . Colon polyps   . Diverticulosis of colon   . Hypertension   . Macular degeneration, wet (HCC)    Dr Chyrl Civatte  . Obesity   . Peripheral neuropathy     Past Surgical History:  Procedure Laterality Date  . APPENDECTOMY    . KNEE ARTHROSCOPY     BOTH KNEES  . MASTOID DEBRIDEMENT    . PILONIDAL CYST EXCISION  1960  . TONSILLECTOMY      Family  History  Problem Relation Age of Onset  . Cancer Mother   . Obesity Sister   . Diabetes Sister   . Diabetes Maternal Uncle     Social History   Socioeconomic History  . Marital status: Widowed    Spouse name: Not on file  . Number of children: 3  . Years of education: Not on file  . Highest education level: Not on file  Occupational History  . 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 Financial resource strain: Not on file  . Food insecurity:    Worry: Not on file    Inability: Not on file  . Transportation needs:    Medical: Not on file    Non-medical: Not on file  Tobacco Use  . Smoking status: Former Smoker    Types: Cigarettes    Last attempt to quit: 05/17/1983    Years since quitting: 34.3  . Smokeless tobacco: Never Used  Substance and Sexual Activity  . Alcohol use: Yes    Alcohol/week: 0.0 oz    Comment: DRINK EVERYDAY  . Drug use: No  . Sexual activity: Not on file  Lifestyle  . Physical activity:    Days per week: Not on file    Minutes per session: Not on file  . Stress: Not on file  Relationships  . Social connections:    Talks on phone: Not on file  Gets together: Not on file    Attends religious service: Not on file    Active member of club or organization: Not on file    Attends meetings of clubs or organizations: Not on file    Relationship status: Not on file  . Intimate partner violence:    Fear of current or ex partner: Not on file    Emotionally abused: Not on file    Physically abused: Not on file    Forced sexual activity: Not on file  Other Topics Concern  . Not on file  Social History Narrative   Has living will    Son Brad Reed is health care POA   Would accept resuscitation but no prolonged artificial life support   No tube feeds if cognitively unaware   Review of Systems Teeth okay---overdue for dentist Sleeps well Doesn't wear seat belt consistently---discussed Bowels  are fine. No blood No heartburn. Chronic dysphagia--if he doesn't chew enough Some AM back soreness--loosens up after a while No rash or suspicious lesions. Some flaking    Objective:   Physical Exam  Constitutional: He is oriented to person, place, and time. He appears well-developed. No distress.  HENT:  Mouth/Throat: Oropharynx is clear and moist. No oropharyngeal exudate.  Neck: Normal range of motion. No thyromegaly present.  Cardiovascular: Normal rate, regular rhythm, normal heart sounds and intact distal pulses. Exam reveals no gallop.  No murmur heard. Pulmonary/Chest: Effort normal and breath sounds normal. No respiratory distress. He has no wheezes. He has no rales.  Abdominal: Soft. There is no tenderness.  Musculoskeletal: Normal range of motion. He exhibits no tenderness.  Thick calves/feet without pitting  Lymphadenopathy:    He has no cervical adenopathy.  Neurological: He is alert and oriented to person, place, and time.  President--- "Garnet Koyanagi, Obama, Bush" [phone] D-l-r-[id number]-w Recall 2/3  Skin: Skin is warm. No rash noted.  Psychiatric: He has a normal mood and affect. His behavior is normal.          Assessment & Plan:

## 2017-09-15 NOTE — Assessment & Plan Note (Signed)
Mild symptoms Would retry tamsulosin if worsens

## 2017-09-15 NOTE — Addendum Note (Signed)
Addended by: Eual Fines on: 09/15/2017 11:44 AM   Modules accepted: Orders

## 2017-09-15 NOTE — Assessment & Plan Note (Signed)
BP Readings from Last 3 Encounters:  09/15/17 140/88  09/14/16 (!) 146/86  09/09/15 130/88   Reasonable control even though he misses some doses

## 2017-09-15 NOTE — Progress Notes (Signed)
Hearing Screening   Method: Audiometry             Right ear:   40 40 40  40    Left ear:   0 0 40  40    Vision Screening Comments: April 2019

## 2017-09-15 NOTE — Assessment & Plan Note (Signed)
Has lost considerable weight this year Working on diet  Discussed some walking Goal to lose at least 10# this year

## 2017-09-15 NOTE — Assessment & Plan Note (Signed)
See social history 

## 2017-09-15 NOTE — Assessment & Plan Note (Signed)
Continues on the avastin injections

## 2017-09-15 NOTE — Assessment & Plan Note (Signed)
I have personally reviewed the Medicare Annual Wellness questionnaire and have noted 1. The patient's medical and social history 2. Their use of alcohol, tobacco or illicit drugs 3. Their current medications and supplements 4. The patient's functional ability including ADL's, fall risks, home safety risks and hearing or visual             impairment. 5. Diet and physical activities 6. Evidence for depression or mood disorders  The patients weight, height, BMI and visual acuity have been recorded in the chart I have made referrals, counseling and provided education to the patient based review of the above and I have provided the pt with a written personalized care plan for preventive services.  I have provided you with a copy of your personalized plan for preventive services. Please take the time to review along with your updated medication list.  No cancer screening due to age Pneumovax booster today Flu vaccine yearly Working on fitness

## 2017-09-16 ENCOUNTER — Other Ambulatory Visit: Payer: Self-pay | Admitting: Internal Medicine

## 2017-09-16 DIAGNOSIS — E538 Deficiency of other specified B group vitamins: Secondary | ICD-10-CM

## 2017-10-10 DIAGNOSIS — H2513 Age-related nuclear cataract, bilateral: Secondary | ICD-10-CM | POA: Diagnosis not present

## 2017-10-10 DIAGNOSIS — H353221 Exudative age-related macular degeneration, left eye, with active choroidal neovascularization: Secondary | ICD-10-CM | POA: Diagnosis not present

## 2017-10-23 ENCOUNTER — Ambulatory Visit: Payer: Medicare Other | Admitting: Podiatry

## 2017-10-30 ENCOUNTER — Other Ambulatory Visit (INDEPENDENT_AMBULATORY_CARE_PROVIDER_SITE_OTHER): Payer: Medicare Other

## 2017-10-30 DIAGNOSIS — E538 Deficiency of other specified B group vitamins: Secondary | ICD-10-CM | POA: Diagnosis not present

## 2017-10-30 LAB — VITAMIN B12: Vitamin B-12: 772 pg/mL (ref 211–911)

## 2017-11-30 ENCOUNTER — Ambulatory Visit (INDEPENDENT_AMBULATORY_CARE_PROVIDER_SITE_OTHER): Payer: Medicare Other | Admitting: Podiatry

## 2017-11-30 ENCOUNTER — Encounter: Payer: Self-pay | Admitting: Podiatry

## 2017-11-30 DIAGNOSIS — M79676 Pain in unspecified toe(s): Secondary | ICD-10-CM

## 2017-11-30 DIAGNOSIS — B351 Tinea unguium: Secondary | ICD-10-CM | POA: Diagnosis not present

## 2017-11-30 DIAGNOSIS — G629 Polyneuropathy, unspecified: Secondary | ICD-10-CM

## 2017-11-30 NOTE — Progress Notes (Signed)
Complaint:  Visit Type: Patient returns to my office for continued preventative foot care services. Complaint: Patient states" my nails have grown long and thick and become painful to walk and wear shoes" Patient has been diagnosed pre-diabetic with neuropathy. The patient presents for preventative foot care services. No changes to ROS  Podiatric Exam: Vascular: dorsalis pedis  pulses are palpable bilateral.  PT pulses are non palpable  B/L.  Excessive swelling  B/L   Capillary return is immediate. Temperature gradient is WNL. Skin turgor WNL  Sensorium: Diminished  Semmes Weinstein monofilament test. Normal tactile sensation bilaterally. Nail Exam: Pt has thick disfigured discolored nails with subungual debris noted bilateral entire nail hallux through fifth toenails Ulcer Exam: There is no evidence of ulcer or pre-ulcerative changes or infection. Orthopedic Exam: Muscle tone and strength are WNL. No limitations in general ROM. No crepitus or effusions noted. Foot type and digits show no abnormalities. Bony prominences are unremarkable. Skin: No Porokeratosis. No infection or ulcers  Diagnosis:  Onychomycosis, , Pain in right toe, pain in left toes  Treatment & Plan Procedures and Treatment: Consent by patient was obtained for treatment procedures. The patient understood the discussion of treatment and procedures well. All questions were answered thoroughly reviewed. Debridement of mycotic and hypertrophic toenails, 1 through 5 bilateral and clearing of subungual debris. No ulceration, no infection noted.  Return Visit-Office Procedure: Patient instructed to return to the office for a follow up visit 3 months for continued evaluation and treatment.    Krishawn Vanderweele DPM 

## 2017-12-06 DIAGNOSIS — H353221 Exudative age-related macular degeneration, left eye, with active choroidal neovascularization: Secondary | ICD-10-CM | POA: Diagnosis not present

## 2018-02-06 DIAGNOSIS — H353221 Exudative age-related macular degeneration, left eye, with active choroidal neovascularization: Secondary | ICD-10-CM | POA: Diagnosis not present

## 2018-02-21 DIAGNOSIS — H2512 Age-related nuclear cataract, left eye: Secondary | ICD-10-CM | POA: Diagnosis not present

## 2018-02-27 ENCOUNTER — Encounter: Payer: Self-pay | Admitting: *Deleted

## 2018-02-27 ENCOUNTER — Other Ambulatory Visit: Payer: Self-pay

## 2018-03-02 NOTE — Discharge Instructions (Signed)

## 2018-03-05 ENCOUNTER — Encounter
Admission: RE | Disposition: A | Discharge status: Home / Self Care | Payer: Self-pay | Source: Ambulatory Visit | Attending: Ophthalmology

## 2018-03-05 ENCOUNTER — Ambulatory Visit: Payer: Medicare Other | Admitting: Anesthesiology

## 2018-03-05 ENCOUNTER — Ambulatory Visit
Admission: RE | Admit: 2018-03-05 | Discharge: 2018-03-05 | Disposition: A | Discharge status: Home / Self Care | Payer: Medicare Other | Source: Ambulatory Visit | Attending: Ophthalmology | Admitting: Ophthalmology

## 2018-03-05 DIAGNOSIS — E1140 Type 2 diabetes mellitus with diabetic neuropathy, unspecified: Secondary | ICD-10-CM | POA: Insufficient documentation

## 2018-03-05 DIAGNOSIS — I10 Essential (primary) hypertension: Secondary | ICD-10-CM | POA: Diagnosis not present

## 2018-03-05 DIAGNOSIS — Z6841 Body Mass Index (BMI) 40.0 and over, adult: Secondary | ICD-10-CM | POA: Insufficient documentation

## 2018-03-05 DIAGNOSIS — G4730 Sleep apnea, unspecified: Secondary | ICD-10-CM | POA: Diagnosis not present

## 2018-03-05 DIAGNOSIS — Z79899 Other long term (current) drug therapy: Secondary | ICD-10-CM | POA: Diagnosis not present

## 2018-03-05 DIAGNOSIS — Z87891 Personal history of nicotine dependence: Secondary | ICD-10-CM | POA: Insufficient documentation

## 2018-03-05 DIAGNOSIS — H25812 Combined forms of age-related cataract, left eye: Secondary | ICD-10-CM | POA: Diagnosis not present

## 2018-03-05 DIAGNOSIS — H2512 Age-related nuclear cataract, left eye: Secondary | ICD-10-CM | POA: Diagnosis not present

## 2018-03-05 HISTORY — DX: Sleep apnea, unspecified: G47.30

## 2018-03-05 HISTORY — DX: Unspecified osteoarthritis, unspecified site: M19.90

## 2018-03-05 HISTORY — PX: CATARACT EXTRACTION W/PHACO: SHX586

## 2018-03-05 SURGERY — PHACOEMULSIFICATION, CATARACT, WITH IOL INSERTION
Anesthesia: Monitor Anesthesia Care | Site: Eye | Laterality: Left

## 2018-03-05 MED ORDER — BRIMONIDINE TARTRATE-TIMOLOL 0.2-0.5 % OP SOLN
OPHTHALMIC | Status: DC | PRN
  Administered 2018-03-05: 1 [drp] via OPHTHALMIC

## 2018-03-05 MED ORDER — MIDAZOLAM HCL 2 MG/2ML IJ SOLN
INTRAMUSCULAR | Status: DC | PRN
  Administered 2018-03-05: 1 mg via INTRAVENOUS

## 2018-03-05 MED ORDER — LIDOCAINE HCL (PF) 2 % IJ SOLN
INTRAOCULAR | Status: DC | PRN
  Administered 2018-03-05: 1 mL via INTRAOCULAR

## 2018-03-05 MED ORDER — EPINEPHRINE PF 1 MG/ML IJ SOLN
INTRAOCULAR | Status: DC | PRN
  Administered 2018-03-05: 128 mL via OPHTHALMIC

## 2018-03-05 MED ORDER — MOXIFLOXACIN HCL 0.5 % OP SOLN
OPHTHALMIC | Status: DC | PRN
  Administered 2018-03-05: 0.2 mL via OPHTHALMIC

## 2018-03-05 MED ORDER — TETRACAINE HCL 0.5 % OP SOLN
1.0000 [drp] | OPHTHALMIC | Status: DC | PRN
  Administered 2018-03-05 (×2): 1 [drp] via OPHTHALMIC

## 2018-03-05 MED ORDER — LACTATED RINGERS IV SOLN: 10.0000 mL/h | INTRAVENOUS | Status: DC

## 2018-03-05 MED ORDER — SODIUM HYALURONATE 23 MG/ML IO SOLN
INTRAOCULAR | Status: DC | PRN
  Administered 2018-03-05: 0.6 mL via INTRAOCULAR

## 2018-03-05 MED ORDER — ARMC OPHTHALMIC DILATING DROPS
1.0000 "application " | OPHTHALMIC | Status: DC | PRN
  Administered 2018-03-05 (×3): 1 via OPHTHALMIC

## 2018-03-05 MED ORDER — ONDANSETRON HCL 4 MG/2ML IJ SOLN
4.0000 mg | Freq: Once | INTRAMUSCULAR | Status: AC | PRN
  Administered 2018-03-05: 4 mg via INTRAVENOUS

## 2018-03-05 MED ORDER — SODIUM HYALURONATE 10 MG/ML IO SOLN
INTRAOCULAR | Status: DC | PRN
  Administered 2018-03-05: 0.55 mL via INTRAOCULAR

## 2018-03-05 MED ORDER — FENTANYL CITRATE (PF) 100 MCG/2ML IJ SOLN
INTRAMUSCULAR | Status: DC | PRN
  Administered 2018-03-05: 50 ug via INTRAVENOUS

## 2018-03-05 SURGICAL SUPPLY — 17 items
CANNULA ANT/CHMB 27G (MISCELLANEOUS) ×1 IMPLANT
CANNULA ANT/CHMB 27GA (MISCELLANEOUS) ×3 IMPLANT
DISSECTOR HYDRO NUCLEUS 50X22 (MISCELLANEOUS) ×3 IMPLANT
GLOVE BIO SURGEON STRL SZ8 (GLOVE) ×3 IMPLANT
GLOVE SURG LX 7.5 STRW (GLOVE) ×2
GLOVE SURG LX STRL 7.5 STRW (GLOVE) ×1 IMPLANT
GOWN STRL REUS W/ TWL LRG LVL3 (GOWN DISPOSABLE) ×2 IMPLANT
GOWN STRL REUS W/TWL LRG LVL3 (GOWN DISPOSABLE) ×6
LENS IOL TECNIS ITEC 27.5 (Intraocular Lens) ×2 IMPLANT
MARKER SKIN DUAL TIP RULER LAB (MISCELLANEOUS) ×3 IMPLANT
PACK DR. KING ARMS (PACKS) ×3 IMPLANT
PACK EYE AFTER SURG (MISCELLANEOUS) ×3 IMPLANT
PACK OPTHALMIC (MISCELLANEOUS) ×3 IMPLANT
SYR 3ML LL SCALE MARK (SYRINGE) ×3 IMPLANT
SYR TB 1ML LUER SLIP (SYRINGE) ×3 IMPLANT
WATER STERILE IRR 500ML POUR (IV SOLUTION) ×3 IMPLANT
WIPE NON LINTING 3.25X3.25 (MISCELLANEOUS) ×3 IMPLANT

## 2018-03-05 NOTE — Anesthesia Postprocedure Evaluation (Signed)
Anesthesia Post Note  Patient: Brad Reed  Procedure(s) Performed: CATARACT EXTRACTION PHACO AND INTRAOCULAR LENS PLACEMENT (IOC) LEFT (Left Eye)  Patient location during evaluation: PACU Anesthesia Type: MAC Level of consciousness: awake and alert Pain management: pain level controlled Vital Signs Assessment: post-procedure vital signs reviewed and stable Respiratory status: spontaneous breathing, nonlabored ventilation, respiratory function stable and patient connected to nasal cannula oxygen Cardiovascular status: stable and blood pressure returned to baseline Postop Assessment: no apparent nausea or vomiting Anesthetic complications: no    Veda Canning

## 2018-03-05 NOTE — Anesthesia Preprocedure Evaluation (Addendum)
Anesthesia Evaluation  Patient identified by MRN, date of birth, ID band Patient awake    Reviewed: Allergy & Precautions, NPO status , Patient's Chart, lab work & pertinent test results  Airway Mallampati: II  TM Distance: >3 FB     Dental   Pulmonary sleep apnea , former smoker,    breath sounds clear to auscultation       Cardiovascular hypertension,  Rhythm:Regular Rate:Normal     Neuro/Psych    GI/Hepatic   Endo/Other  diabetes (pre)Morbid obesity (BMI 47)  Renal/GU      Musculoskeletal  (+) Arthritis ,   Abdominal   Peds  Hematology   Anesthesia Other Findings   Reproductive/Obstetrics                            Anesthesia Physical Anesthesia Plan  ASA: III  Anesthesia Plan: MAC   Post-op Pain Management:    Induction: Intravenous  PONV Risk Score and Plan:   Airway Management Planned: Nasal Cannula  Additional Equipment:   Intra-op Plan:   Post-operative Plan:   Informed Consent: I have reviewed the patients History and Physical, chart, labs and discussed the procedure including the risks, benefits and alternatives for the proposed anesthesia with the patient or authorized representative who has indicated his/her understanding and acceptance.     Plan Discussed with: CRNA  Anesthesia Plan Comments:         Anesthesia Quick Evaluation

## 2018-03-05 NOTE — H&P (Signed)
The History and Physical notes are on paper, have been signed, and are to be scanned.   I have examined the patient and there are no changes to the H&P.   Willey Blade 03/05/2018 8:14 AM

## 2018-03-05 NOTE — Transfer of Care (Signed)
Immediate Anesthesia Transfer of Care Note  Patient: Brad Reed  Procedure(s) Performed: CATARACT EXTRACTION PHACO AND INTRAOCULAR LENS PLACEMENT (IOC) LEFT (Left Eye)  Patient Location: PACU  Anesthesia Type: MAC  Level of Consciousness: awake, alert  and patient cooperative  Airway and Oxygen Therapy: Patient Spontanous Breathing and Patient connected to supplemental oxygen  Post-op Assessment: Post-op Vital signs reviewed, Patient's Cardiovascular Status Stable, Respiratory Function Stable, Patent Airway and No signs of Nausea or vomiting  Post-op Vital Signs: Reviewed and stable  Complications: No apparent anesthesia complications

## 2018-03-05 NOTE — Op Note (Signed)
OPERATIVE NOTE  Brad Reed [id number] 03/05/2018   PREOPERATIVE DIAGNOSIS:  Nuclear sclerotic cataract left eye.  H25.12   POSTOPERATIVE DIAGNOSIS:    Nuclear sclerotic cataract left eye.     PROCEDURE:  Phacoemusification with posterior chamber intraocular lens placement of the left eye   LENS:   Implant Name Type Inv. Item Serial No. Manufacturer Lot No. LRB No. Used  LENS IOL DIOP 27.5 - [id number] Intraocular Lens LENS IOL DIOP 27.5 [id number] AMO  Left 1       PCB00 +27.58   ULTRASOUND TIME: 0 minutes 27 seconds.  CDE 2.31   SURGEON:  Willey Blade, MD, MPH   ANESTHESIA:  Topical with tetracaine drops augmented with 1% preservative-free intracameral lidocaine.  ESTIMATED BLOOD LOSS: <1 mL   COMPLICATIONS:  None.   DESCRIPTION OF PROCEDURE:  The patient was identified in the holding room and transported to the operating room and placed in the supine position under the operating microscope.  The left eye was identified as the operative eye and it was prepped and draped in the usual sterile ophthalmic fashion.   A 1.0 millimeter clear-corneal paracentesis was made at the 5:00 position. 0.5 ml of preservative-free 1% lidocaine with epinephrine was injected into the anterior chamber.  The anterior chamber was filled with Healon 5 viscoelastic.  A 2.4 millimeter keratome was used to make a near-clear corneal incision at the 2:00 position.  A curvilinear capsulorrhexis was made with a cystotome and capsulorrhexis forceps.  Balanced salt solution was used to hydrodissect and hydrodelineate the nucleus.   Phacoemulsification was then used in stop and chop fashion to remove the lens nucleus and epinucleus.  The remaining cortex was then removed using the irrigation and aspiration handpiece. Healon was then placed into the capsular bag to distend it for lens placement.  A lens was then injected into the capsular bag.  The remaining viscoelastic was aspirated.   Wounds were hydrated  with balanced salt solution.  The anterior chamber was inflated to a physiologic pressure with balanced salt solution.  Intracameral vigamox 0.1 mL undiltued was injected into the eye and a drop placed onto the ocular surface.  There was a prominent brow, a strong eyelid squeezing reflex, a shallow anterior chamber, and moderate eye movement throughout the case but there were no complications.   No wound leaks were noted.  The patient was taken to the recovery room in stable condition without complications of anesthesia or surgery  Willey Blade 03/05/2018, 9:02 AM

## 2018-03-06 ENCOUNTER — Encounter: Payer: Self-pay | Admitting: Ophthalmology

## 2018-03-08 ENCOUNTER — Ambulatory Visit (INDEPENDENT_AMBULATORY_CARE_PROVIDER_SITE_OTHER): Payer: Medicare Other | Admitting: Podiatry

## 2018-03-08 ENCOUNTER — Encounter: Payer: Self-pay | Admitting: Podiatry

## 2018-03-08 DIAGNOSIS — B351 Tinea unguium: Secondary | ICD-10-CM | POA: Diagnosis not present

## 2018-03-08 DIAGNOSIS — M79676 Pain in unspecified toe(s): Secondary | ICD-10-CM

## 2018-03-08 NOTE — Progress Notes (Signed)
Complaint:  Visit Type: Patient returns to my office for continued preventative foot care services. Complaint: Patient states" my nails have grown long and thick and become painful to walk and wear shoes" Patient has been diagnosed pre-diabetic with neuropathy. The patient presents for preventative foot care services. No changes to ROS  Podiatric Exam: Vascular: dorsalis pedis  pulses are palpable bilateral.  PT pulses are non palpable  B/L.  Excessive swelling  B/L   Capillary return is immediate. Temperature gradient is WNL. Skin turgor WNL  Sensorium: Diminished  Semmes Weinstein monofilament test. Normal tactile sensation bilaterally. Nail Exam: Pt has thick disfigured discolored nails with subungual debris noted bilateral entire nail hallux through fifth toenails Ulcer Exam: There is no evidence of ulcer or pre-ulcerative changes or infection. Orthopedic Exam: Muscle tone and strength are WNL. No limitations in general ROM. No crepitus or effusions noted. Foot type and digits show no abnormalities. Bony prominences are unremarkable. Skin: No Porokeratosis. No infection or ulcers  Diagnosis:  Onychomycosis, , Pain in right toe, pain in left toes  Treatment & Plan Procedures and Treatment: Consent by patient was obtained for treatment procedures. The patient understood the discussion of treatment and procedures well. All questions were answered thoroughly reviewed. Debridement of mycotic and hypertrophic toenails, 1 through 5 bilateral and clearing of subungual debris. No ulceration, no infection noted.  Return Visit-Office Procedure: Patient instructed to return to the office for a follow up visit 3 months for continued evaluation and treatment.    Kyrin Garn DPM 

## 2018-03-14 ENCOUNTER — Other Ambulatory Visit: Payer: Self-pay | Admitting: Internal Medicine

## 2018-03-15 DIAGNOSIS — H2511 Age-related nuclear cataract, right eye: Secondary | ICD-10-CM | POA: Diagnosis not present

## 2018-03-16 NOTE — Discharge Instructions (Signed)

## 2018-03-19 ENCOUNTER — Ambulatory Visit
Admission: RE | Admit: 2018-03-19 | Discharge: 2018-03-19 | Disposition: A | Discharge status: Home / Self Care | Payer: Medicare Other | Source: Ambulatory Visit | Attending: Ophthalmology | Admitting: Ophthalmology

## 2018-03-19 ENCOUNTER — Ambulatory Visit: Payer: Medicare Other | Admitting: Anesthesiology

## 2018-03-19 ENCOUNTER — Encounter
Admission: RE | Disposition: A | Discharge status: Home / Self Care | Payer: Self-pay | Source: Ambulatory Visit | Attending: Ophthalmology

## 2018-03-19 DIAGNOSIS — G4730 Sleep apnea, unspecified: Secondary | ICD-10-CM | POA: Insufficient documentation

## 2018-03-19 DIAGNOSIS — I10 Essential (primary) hypertension: Secondary | ICD-10-CM | POA: Insufficient documentation

## 2018-03-19 DIAGNOSIS — Z87891 Personal history of nicotine dependence: Secondary | ICD-10-CM | POA: Diagnosis not present

## 2018-03-19 DIAGNOSIS — H2511 Age-related nuclear cataract, right eye: Secondary | ICD-10-CM | POA: Insufficient documentation

## 2018-03-19 DIAGNOSIS — E1140 Type 2 diabetes mellitus with diabetic neuropathy, unspecified: Secondary | ICD-10-CM | POA: Insufficient documentation

## 2018-03-19 DIAGNOSIS — E1136 Type 2 diabetes mellitus with diabetic cataract: Secondary | ICD-10-CM | POA: Insufficient documentation

## 2018-03-19 DIAGNOSIS — Z79899 Other long term (current) drug therapy: Secondary | ICD-10-CM | POA: Diagnosis not present

## 2018-03-19 DIAGNOSIS — Z6841 Body Mass Index (BMI) 40.0 and over, adult: Secondary | ICD-10-CM | POA: Insufficient documentation

## 2018-03-19 DIAGNOSIS — H25811 Combined forms of age-related cataract, right eye: Secondary | ICD-10-CM | POA: Diagnosis not present

## 2018-03-19 HISTORY — PX: CATARACT EXTRACTION W/PHACO: SHX586

## 2018-03-19 SURGERY — PHACOEMULSIFICATION, CATARACT, WITH IOL INSERTION
Anesthesia: Monitor Anesthesia Care | Site: Eye | Laterality: Right

## 2018-03-19 MED ORDER — ONDANSETRON HCL 4 MG/2ML IJ SOLN: 4.0000 mg | Freq: Once | INTRAMUSCULAR | Status: DC | PRN

## 2018-03-19 MED ORDER — LIDOCAINE HCL (PF) 2 % IJ SOLN
INTRAOCULAR | Status: DC | PRN
  Administered 2018-03-19: .5 mL via INTRAOCULAR

## 2018-03-19 MED ORDER — ARMC OPHTHALMIC DILATING DROPS
1.0000 "application " | OPHTHALMIC | Status: DC | PRN
  Administered 2018-03-19 (×3): 1 via OPHTHALMIC

## 2018-03-19 MED ORDER — TETRACAINE HCL 0.5 % OP SOLN
1.0000 [drp] | OPHTHALMIC | Status: DC | PRN
  Administered 2018-03-19 (×2): 1 [drp] via OPHTHALMIC

## 2018-03-19 MED ORDER — SODIUM HYALURONATE 23 MG/ML IO SOLN
INTRAOCULAR | Status: DC | PRN
  Administered 2018-03-19: 0.6 mL via INTRAOCULAR

## 2018-03-19 MED ORDER — LACTATED RINGERS IV SOLN: 10.0000 mL/h | INTRAVENOUS | Status: DC

## 2018-03-19 MED ORDER — SODIUM HYALURONATE 10 MG/ML IO SOLN
INTRAOCULAR | Status: DC | PRN
  Administered 2018-03-19: 0.55 mL via INTRAOCULAR

## 2018-03-19 MED ORDER — LIDOCAINE HCL (PF) 4 % IJ SOLN
INTRAMUSCULAR | Status: DC | PRN
  Administered 2018-03-19: 4 mL via OPHTHALMIC

## 2018-03-19 MED ORDER — MIDAZOLAM HCL 2 MG/2ML IJ SOLN
INTRAMUSCULAR | Status: DC | PRN
  Administered 2018-03-19 (×2): 1 mg via INTRAVENOUS

## 2018-03-19 MED ORDER — MOXIFLOXACIN HCL 0.5 % OP SOLN
OPHTHALMIC | Status: DC | PRN
Start: 2018-03-19 — End: 2018-03-19
  Administered 2018-03-19: 0.2 mL via OPHTHALMIC

## 2018-03-19 MED ORDER — ALFENTANIL 500 MCG/ML IJ INJ
INJECTION | INTRAVENOUS | Status: DC | PRN
  Administered 2018-03-19: 700 ug via INTRAVENOUS
  Administered 2018-03-19: 500 ug via INTRAVENOUS

## 2018-03-19 MED ORDER — NEOMYCIN-POLYMYXIN-DEXAMETH 3.5-10000-0.1 OP OINT
TOPICAL_OINTMENT | OPHTHALMIC | Status: DC | PRN
  Administered 2018-03-19: 1 via OPHTHALMIC

## 2018-03-19 MED ORDER — EPINEPHRINE PF 1 MG/ML IJ SOLN
INTRAOCULAR | Status: DC | PRN
  Administered 2018-03-19: 106 mL via OPHTHALMIC

## 2018-03-19 SURGICAL SUPPLY — 22 items
BNDG EYE OVAL (GAUZE/BANDAGES/DRESSINGS) ×4 IMPLANT
CANNULA ANT/CHMB 27G (MISCELLANEOUS) ×1 IMPLANT
CANNULA ANT/CHMB 27GA (MISCELLANEOUS) ×3 IMPLANT
DISSECTOR HYDRO NUCLEUS 50X22 (MISCELLANEOUS) ×3 IMPLANT
GLOVE SURG LX 7.5 STRW (GLOVE) ×2
GLOVE SURG LX STRL 7.5 STRW (GLOVE) ×1 IMPLANT
GLOVE SURG SYN 8.5  E (GLOVE) ×2
GLOVE SURG SYN 8.5 E (GLOVE) ×1 IMPLANT
GLOVE SURG SYN 8.5 PF PI (GLOVE) ×1 IMPLANT
GOWN STRL REUS W/ TWL LRG LVL3 (GOWN DISPOSABLE) ×2 IMPLANT
GOWN STRL REUS W/TWL LRG LVL3 (GOWN DISPOSABLE) ×6
LENS IOL TECNIS ITEC 29.5 (Intraocular Lens) ×2 IMPLANT
MARKER SKIN DUAL TIP RULER LAB (MISCELLANEOUS) ×3 IMPLANT
NDL RETROBULBAR .5 NSTRL (NEEDLE) ×2 IMPLANT
PACK DR. KING ARMS (PACKS) ×3 IMPLANT
PACK EYE AFTER SURG (MISCELLANEOUS) ×3 IMPLANT
PACK OPTHALMIC (MISCELLANEOUS) ×3 IMPLANT
SYR 10ML LL (SYRINGE) ×2 IMPLANT
SYR 3ML LL SCALE MARK (SYRINGE) ×3 IMPLANT
SYR TB 1ML LUER SLIP (SYRINGE) ×3 IMPLANT
WATER STERILE IRR 500ML POUR (IV SOLUTION) ×3 IMPLANT
WIPE NON LINTING 3.25X3.25 (MISCELLANEOUS) ×3 IMPLANT

## 2018-03-19 NOTE — Op Note (Signed)
OPERATIVE NOTE  Brad Reed [id number] 03/19/2018   PREOPERATIVE DIAGNOSIS:  Nuclear sclerotic cataract right eye.  H25.11   POSTOPERATIVE DIAGNOSIS:    Nuclear sclerotic cataract right eye.     PROCEDURE:  Phacoemusification with posterior chamber intraocular lens placement of the right eye   LENS:   Implant Name Type Inv. Item Serial No. Manufacturer Lot No. LRB No. Used  LENS IOL DIOP 29.5 - [id number] Intraocular Lens LENS IOL DIOP 29.5 [id number]  Right 1       PCB00 +29.5   ULTRASOUND TIME: 0 minutes 52 seconds.  CDE 3.87   SURGEON:  Benay Pillow, MD, MPH  ANESTHESIOLOGIST: Anesthesiologist: Lavonna Monarch, MD CRNA: Mayme Genta, CRNA; Janna Arch, CRNA   ANESTHESIA:  MAC and retrobulbar block.  ESTIMATED BLOOD LOSS: less than 1 mL.   COMPLICATIONS:  None.   DESCRIPTION OF PROCEDURE:  The patient was identified in the holding room and transported to the operating room and placed in the supine position under the operating microscope.    A retrobulbar block was performed in the standard fashion, 4 cc of 50%/50% 0.75% bupivicaine, 2% lido and small amount of vitrase.  The right eye was identified as the operative eye and it was prepped and draped in the usual sterile ophthalmic fashion.   A 1.0 millimeter clear-corneal paracentesis was made at the 10:30 position. 0.5 ml of preservative-free 1% lidocaine with epinephrine was injected into the anterior chamber.  The anterior chamber was filled with Healon 5 viscoelastic.  A 2.4 millimeter keratome was used to make a near-clear corneal incision at the 8:00 position.  A curvilinear capsulorrhexis was made with a cystotome and capsulorrhexis forceps.  Balanced salt solution was used to hydrodissect and hydrodelineate the nucleus.   Phacoemulsification was then used in stop and chop fashion to remove the lens nucleus and epinucleus.  The remaining cortex was then removed using the irrigation and aspiration  handpiece. Healon was then placed into the capsular bag to distend it for lens placement.  A lens was then injected into the capsular bag.  The remaining viscoelastic was aspirated.   Wounds were hydrated with balanced salt solution.  The anterior chamber was inflated to a physiologic pressure with balanced salt solution.   Intracameral vigamox 0.1 mL undiluted was injected into the eye and a drop placed onto the ocular surface.  No wound leaks were noted.  Maxitrol ointment, a patch and shield were placed on the patient's right eye.  The patient was taken to the recovery room in stable condition without complications of anesthesia or surgery  Benay Pillow 03/19/2018, 12:39 PM

## 2018-03-19 NOTE — Anesthesia Procedure Notes (Signed)
Procedure Name: MAC Date/Time: 03/19/2018 12:07 PM Performed by: Janna Arch, CRNA Pre-anesthesia Checklist: Patient identified, Emergency Drugs available, Suction available, Timeout performed and Patient being monitored Patient Re-evaluated:Patient Re-evaluated prior to induction Oxygen Delivery Method: Nasal cannula Placement Confirmation: positive ETCO2

## 2018-03-19 NOTE — Anesthesia Postprocedure Evaluation (Signed)
Anesthesia Post Note  Patient: Brad Reed  Procedure(s) Performed: CATARACT EXTRACTION PHACO AND INTRAOCULAR LENS PLACEMENT (IOC) (Right Eye)  Patient location during evaluation: PACU Anesthesia Type: MAC Level of consciousness: awake Pain management: pain level controlled Vital Signs Assessment: post-procedure vital signs reviewed and stable Respiratory status: spontaneous breathing Cardiovascular status: blood pressure returned to baseline Postop Assessment: no headache Anesthetic complications: no    Lavonna Monarch

## 2018-03-19 NOTE — Anesthesia Preprocedure Evaluation (Signed)
Anesthesia Evaluation  Patient identified by MRN, date of birth, ID band Patient awake    Reviewed: Allergy & Precautions, NPO status , Patient's Chart, lab work & pertinent test results, reviewed documented beta blocker date and time   Airway Mallampati: II  TM Distance: <3 FB Neck ROM: Full    Dental no notable dental hx.    Pulmonary sleep apnea , former smoker,    Pulmonary exam normal breath sounds clear to auscultation       Cardiovascular hypertension, Normal cardiovascular exam Rhythm:Regular Rate:Normal     Neuro/Psych  Neuromuscular disease negative psych ROS   GI/Hepatic negative GI ROS, Neg liver ROS,   Endo/Other  Morbid obesity  Renal/GU negative Renal ROS     Musculoskeletal  (+) Arthritis ,   Abdominal (+) + obese,   Peds  Hematology negative hematology ROS (+)   Anesthesia Other Findings   Reproductive/Obstetrics                             Anesthesia Physical Anesthesia Plan  ASA: III  Anesthesia Plan: MAC   Post-op Pain Management:    Induction: Intravenous  PONV Risk Score and Plan:   Airway Management Planned: Natural Airway  Additional Equipment: None  Intra-op Plan:   Post-operative Plan:   Informed Consent: I have reviewed the patients History and Physical, chart, labs and discussed the procedure including the risks, benefits and alternatives for the proposed anesthesia with the patient or authorized representative who has indicated his/her understanding and acceptance.     Plan Discussed with: CRNA, Anesthesiologist and Surgeon  Anesthesia Plan Comments:         Anesthesia Quick Evaluation

## 2018-03-19 NOTE — H&P (Signed)
The History and Physical notes are on paper, have been signed, and are to be scanned.   I have examined the patient and there are no changes to the H&P.   Willey Blade 03/19/2018 11:58 AM

## 2018-03-19 NOTE — Transfer of Care (Signed)
Immediate Anesthesia Transfer of Care Note  Patient: Brad Reed  Procedure(s) Performed: CATARACT EXTRACTION PHACO AND INTRAOCULAR LENS PLACEMENT (IOC) (Right Eye)  Patient Location: PACU  Anesthesia Type: MAC  Level of Consciousness: awake, alert  and patient cooperative  Airway and Oxygen Therapy: Patient Spontanous Breathing and Patient connected to supplemental oxygen  Post-op Assessment: Post-op Vital signs reviewed, Patient's Cardiovascular Status Stable, Respiratory Function Stable, Patent Airway and No signs of Nausea or vomiting  Post-op Vital Signs: Reviewed and stable  Complications: No apparent anesthesia complications

## 2018-03-20 ENCOUNTER — Encounter: Payer: Self-pay | Admitting: Ophthalmology

## 2018-04-05 ENCOUNTER — Ambulatory Visit (INDEPENDENT_AMBULATORY_CARE_PROVIDER_SITE_OTHER): Payer: Medicare Other

## 2018-04-05 ENCOUNTER — Telehealth: Payer: Self-pay | Admitting: Internal Medicine

## 2018-04-05 DIAGNOSIS — Z23 Encounter for immunization: Secondary | ICD-10-CM

## 2018-04-05 NOTE — Telephone Encounter (Signed)
Pt dropped off Disability Parking form to be filled out by Dr. Alphonsus SiasLetvak. Please call patient when ready to be picked up. [phone]. Form placed in Dr. Alphonsus SiasLetvak bin located in front office.

## 2018-04-05 NOTE — Telephone Encounter (Signed)
I left a message on patient's voice mail that form is ready for pick up and no charge. °

## 2018-04-05 NOTE — Telephone Encounter (Signed)
Form placed in Dr Karle StarchLetvak's inbox to sign form

## 2018-04-05 NOTE — Telephone Encounter (Signed)
Form done No charge 

## 2018-04-18 DIAGNOSIS — H353221 Exudative age-related macular degeneration, left eye, with active choroidal neovascularization: Secondary | ICD-10-CM | POA: Diagnosis not present

## 2018-06-11 ENCOUNTER — Encounter: Payer: Self-pay | Admitting: Podiatry

## 2018-06-11 ENCOUNTER — Ambulatory Visit (INDEPENDENT_AMBULATORY_CARE_PROVIDER_SITE_OTHER): Payer: Medicare PPO | Admitting: Podiatry

## 2018-06-11 DIAGNOSIS — G629 Polyneuropathy, unspecified: Secondary | ICD-10-CM

## 2018-06-11 DIAGNOSIS — M79676 Pain in unspecified toe(s): Secondary | ICD-10-CM | POA: Diagnosis not present

## 2018-06-11 DIAGNOSIS — Z7189 Other specified counseling: Secondary | ICD-10-CM | POA: Diagnosis not present

## 2018-06-11 DIAGNOSIS — B351 Tinea unguium: Secondary | ICD-10-CM | POA: Diagnosis not present

## 2018-06-11 NOTE — Progress Notes (Signed)
Complaint:  Visit Type: Patient returns to my office for continued preventative foot care services. Complaint: Patient states" my nails have grown long and thick and become painful to walk and wear shoes" Patient has been diagnosed pre-diabetic with neuropathy. The patient presents for preventative foot care services. No changes to ROS  Podiatric Exam: Vascular: dorsalis pedis  pulses are palpable bilateral.  PT pulses are non palpable  B/L.  Excessive swelling  B/L   Capillary return is immediate. Temperature gradient is WNL. Skin turgor WNL  Sensorium: Diminished  Semmes Weinstein monofilament test. Normal tactile sensation bilaterally. Nail Exam: Pt has thick disfigured discolored nails with subungual debris noted bilateral entire nail hallux through fifth toenails Ulcer Exam: There is no evidence of ulcer or pre-ulcerative changes or infection. Orthopedic Exam: Muscle tone and strength are WNL. No limitations in general ROM. No crepitus or effusions noted. Foot type and digits show no abnormalities. Bony prominences are unremarkable. Skin: No Porokeratosis. No infection or ulcers  Diagnosis:  Onychomycosis, , Pain in right toe, pain in left toes  Treatment & Plan Procedures and Treatment: Consent by patient was obtained for treatment procedures. The patient understood the discussion of treatment and procedures well. All questions were answered thoroughly reviewed. Debridement of mycotic and hypertrophic toenails, 1 through 5 bilateral and clearing of subungual debris. No ulceration, no infection noted.  Return Visit-Office Procedure: Patient instructed to return to the office for a follow up visit 3 months for continued evaluation and treatment.    Helane Gunther DPM

## 2018-06-14 DIAGNOSIS — H26492 Other secondary cataract, left eye: Secondary | ICD-10-CM | POA: Diagnosis not present

## 2018-06-14 DIAGNOSIS — H353221 Exudative age-related macular degeneration, left eye, with active choroidal neovascularization: Secondary | ICD-10-CM | POA: Diagnosis not present

## 2018-06-25 ENCOUNTER — Ambulatory Visit: Payer: Medicare PPO | Admitting: Internal Medicine

## 2018-06-25 ENCOUNTER — Encounter: Payer: Self-pay | Admitting: Internal Medicine

## 2018-06-25 VITALS — BP 132/78 | HR 112 | Temp 97.4°F | Ht 68.5 in | Wt 307.0 lb

## 2018-06-25 DIAGNOSIS — R1011 Right upper quadrant pain: Secondary | ICD-10-CM

## 2018-06-25 LAB — COMPREHENSIVE METABOLIC PANEL
ALBUMIN: 3 g/dL — AB (ref 3.5–5.2)
ALT: 44 U/L (ref 0–53)
AST: 46 U/L — AB (ref 0–37)
Alkaline Phosphatase: 122 U/L — ABNORMAL HIGH (ref 39–117)
BUN: 15 mg/dL (ref 6–23)
CHLORIDE: 101 meq/L (ref 96–112)
CO2: 29 mEq/L (ref 19–32)
CREATININE: 0.99 mg/dL (ref 0.40–1.50)
Calcium: 9.3 mg/dL (ref 8.4–10.5)
GFR: 72.43 mL/min (ref >60.00)
GLUCOSE: 103 mg/dL — AB (ref 70–99)
POTASSIUM: 4 meq/L (ref 3.5–5.1)
SODIUM: 141 meq/L (ref 135–145)
Total Bilirubin: 1.2 mg/dL (ref 0.2–1.2)
Total Protein: 6.9 g/dL (ref 6.0–8.3)

## 2018-06-25 LAB — CBC
HEMATOCRIT: 47.8 % (ref 39.0–52.0)
Hemoglobin: 15.8 g/dL (ref 13.0–17.0)
MCHC: 33 g/dL (ref 30.0–36.0)
MCV: 101.4 fl — ABNORMAL HIGH (ref 78.0–100.0)
Platelets: 309 10*3/uL (ref 150.0–400.0)
RBC: 4.72 Mil/uL (ref 4.22–5.81)
RDW: 13.2 % (ref 11.5–15.5)
WBC: 15.8 10*3/uL — ABNORMAL HIGH (ref 4.0–10.5)

## 2018-06-25 LAB — LIPASE: LIPASE: 16 U/L (ref 11.0–59.0)

## 2018-06-25 LAB — H. PYLORI ANTIBODY, IGG: H PYLORI IGG: POSITIVE — AB

## 2018-06-25 MED ORDER — OMEPRAZOLE 20 MG PO CPDR: 20.0000 mg | DELAYED_RELEASE_CAPSULE | Freq: Two times a day (BID) | ORAL | 3 refills | Status: DC

## 2018-06-25 NOTE — Progress Notes (Signed)
Subjective:    Patient ID: Brad Reed, male    DOB: 04/02/1935, 84 y.o.   MRN: [id number]  HPI Here due to nausea "I just don't feel good" Gets nauseated just looking at food-- for past 10 days Getting weaker Has fallen--now using rollator Discomfort over all abdomen--not really pain Not clearly worse after eating Has been drinking water  No vomiting  Bowels okay---last yesterday and thinks it was normal No black stool---though may have had slight blood (like from hemorrhoid) Now with lots of burping  Takes ibuprofen once in a while--like every other day Hasn't tried any Rx  Current Outpatient Medications on File Prior to Visit  Medication Sig Dispense Refill  . Ibuprofen 200 MG CAPS (Aleve) take as needed    . multivitamin-lutein (OCUVITE-LUTEIN) CAPS capsule Take 1 capsule by mouth daily.    . potassium chloride (K-DUR) 10 MEQ tablet TAKE 1 TABLET (10 MEQ TOTAL) BY MOUTH 2 (TWO) TIMES DAILY. 180 tablet 2  . triamterene-hydrochlorothiazide (DYAZIDE) 37.5-25 MG capsule TAKE ONE CAPSULE BY MOUTH EVERY MORNING 90 capsule 3   No current facility-administered medications on file prior to visit.     No Known Allergies  Past Medical History:  Diagnosis Date  . Angiodysplasia 02/13/2006  . Arthritis    knees  . Benign prostatic hypertrophy   . Colon polyps   . Diverticulosis of colon   . Hypertension   . Macular degeneration, wet (HCC)    Dr Chyrl Civatte  . Obesity   . Peripheral neuropathy    feet  . Sleep apnea    DX'd when younger. declined CPAP.    Past Surgical History:  Procedure Laterality Date  . APPENDECTOMY    . CATARACT EXTRACTION W/PHACO Left 03/05/2018   Procedure: CATARACT EXTRACTION PHACO AND INTRAOCULAR LENS PLACEMENT (IOC) LEFT;  Surgeon: Nevada Crane, MD;  Location: [facility];  Service: Ophthalmology;  Laterality: Left;  sleep apnea  . CATARACT EXTRACTION W/PHACO Right 03/19/2018   Procedure: CATARACT EXTRACTION PHACO AND  INTRAOCULAR LENS PLACEMENT (IOC);  Surgeon: Nevada Crane, MD;  Location: [facility];  Service: Ophthalmology;  Laterality: Right;  . KNEE ARTHROSCOPY     BOTH KNEES  . MASTOID DEBRIDEMENT    . PILONIDAL CYST EXCISION  1960  . TONSILLECTOMY      Family History  Problem Relation Age of Onset  . Cancer Mother   . Obesity Sister   . Diabetes Sister   . Diabetes Maternal Uncle     Social History   Socioeconomic History  . Marital status: Widowed    Spouse name: Not on file  . Number of children: 3  . Years of education: Not on file  . Highest education level: Not on file  Occupational History  . 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 Financial resource strain: Not on file  . Food insecurity:    Worry: Not on file    Inability: Not on file  . Transportation needs:    Medical: Not on file    Non-medical: Not on file  Tobacco Use  . Smoking status: Former Smoker    Types: Cigarettes    Last attempt to quit: 05/17/1983    Years since quitting: 35.1  . Smokeless tobacco: Never Used  Substance and Sexual Activity  . Alcohol use: Yes    Alcohol/week: 14.0 standard drinks    Types: 14 Shots of liquor per week  Comment: Drinks 2 cocktails /day  . Drug use: No  . Sexual activity: Not on file  Lifestyle  . Physical activity:    Days per week: Not on file    Minutes per session: Not on file  . Stress: Not on file  Relationships  . Social connections:    Talks on phone: Not on file    Gets together: Not on file    Attends religious service: Not on file    Active member of club or organization: Not on file    Attends meetings of clubs or organizations: Not on file    Relationship status: Not on file  . Intimate partner violence:    Fear of current or ex partner: Not on file    Emotionally abused: Not on file    Physically abused: Not on file    Forced sexual activity: Not on file  Other Topics  Concern  . Not on file  Social History Narrative   Has living will    Son [name] is health care POA   Would accept resuscitation but no prolonged artificial life support   No tube feeds if cognitively unaware   Review of Systems  Coughing spell about when this started--after eating Snickers bar No fever Some DOE---like walking to car. No change No dysphagia    Objective:   Physical Exam  Constitutional: He appears well-developed. No distress.  Neck: No thyromegaly present.  Cardiovascular: Normal rate, regular rhythm and normal heart sounds. Exam reveals no gallop.  No murmur heard. Respiratory: Effort normal and breath sounds normal. No respiratory distress. He has no wheezes. He has no rales.  GI: Soft. Bowel sounds are normal. He exhibits no distension and no mass. There is no rebound and no guarding.  Mild tenderness between epigastrium and RUQ  Musculoskeletal:        General: No edema.  Lymphadenopathy:    He has no cervical adenopathy.  Neurological:  Weak---trouble getting up on table Using rollator  Psychiatric: He has a normal mood and affect. His behavior is normal.           Assessment & Plan:

## 2018-06-25 NOTE — Assessment & Plan Note (Addendum)
Does seem to be abdominal---no evidence of pneumonia or extra abdominal cause of the nausea I am concerned about NSAID ulcer---despite not using it daily Could be other ulcer---will check H pylori  Could be gallbladder as well Pancreatitis unlikely Will stop ibuprofen--start PPI Check labs, ultrasound GI evaluation if not better soon

## 2018-06-25 NOTE — Patient Instructions (Signed)
Stop all the ibuprofen--in case that is causing some of the problem. Start the omeprazole twice a day on an empty stomach (30-60 minutes before eating or at bedtime)

## 2018-06-25 NOTE — Addendum Note (Signed)
Addended by: Alvina Chou on: 06/25/2018 12:38 PM   Modules accepted: Orders

## 2018-06-28 ENCOUNTER — Other Ambulatory Visit: Payer: Self-pay | Admitting: Internal Medicine

## 2018-06-28 ENCOUNTER — Ambulatory Visit
Admission: RE | Admit: 2018-06-28 | Discharge: 2018-06-28 | Disposition: A | Discharge status: Home / Self Care | Payer: Medicare PPO | Source: Ambulatory Visit | Attending: Internal Medicine | Admitting: Internal Medicine

## 2018-06-28 DIAGNOSIS — K8020 Calculus of gallbladder without cholecystitis without obstruction: Secondary | ICD-10-CM

## 2018-06-28 DIAGNOSIS — R1011 Right upper quadrant pain: Secondary | ICD-10-CM

## 2018-07-02 ENCOUNTER — Other Ambulatory Visit: Payer: Self-pay

## 2018-07-02 ENCOUNTER — Emergency Department (HOSPITAL_COMMUNITY): Payer: Medicare PPO

## 2018-07-02 ENCOUNTER — Telehealth: Payer: Self-pay | Admitting: Internal Medicine

## 2018-07-02 ENCOUNTER — Inpatient Hospital Stay (HOSPITAL_COMMUNITY)
Admission: EM | Admit: 2018-07-02 | Discharge: 2018-07-08 | DRG: 442 | Disposition: A | Discharge status: Skilled Nursing Facility | Payer: Medicare PPO | Attending: Family Medicine | Admitting: Family Medicine

## 2018-07-02 ENCOUNTER — Encounter (HOSPITAL_COMMUNITY): Payer: Self-pay

## 2018-07-02 DIAGNOSIS — E876 Hypokalemia: Secondary | ICD-10-CM | POA: Diagnosis present

## 2018-07-02 DIAGNOSIS — G4733 Obstructive sleep apnea (adult) (pediatric): Secondary | ICD-10-CM | POA: Diagnosis not present

## 2018-07-02 DIAGNOSIS — K8020 Calculus of gallbladder without cholecystitis without obstruction: Secondary | ICD-10-CM | POA: Diagnosis present

## 2018-07-02 DIAGNOSIS — Z6841 Body Mass Index (BMI) 40.0 and over, adult: Secondary | ICD-10-CM | POA: Diagnosis not present

## 2018-07-02 DIAGNOSIS — R350 Frequency of micturition: Secondary | ICD-10-CM | POA: Diagnosis present

## 2018-07-02 DIAGNOSIS — Z833 Family history of diabetes mellitus: Secondary | ICD-10-CM | POA: Diagnosis not present

## 2018-07-02 DIAGNOSIS — R0902 Hypoxemia: Secondary | ICD-10-CM | POA: Diagnosis not present

## 2018-07-02 DIAGNOSIS — R1011 Right upper quadrant pain: Secondary | ICD-10-CM | POA: Diagnosis not present

## 2018-07-02 DIAGNOSIS — Z87891 Personal history of nicotine dependence: Secondary | ICD-10-CM

## 2018-07-02 DIAGNOSIS — I4891 Unspecified atrial fibrillation: Secondary | ICD-10-CM | POA: Diagnosis not present

## 2018-07-02 DIAGNOSIS — M2550 Pain in unspecified joint: Secondary | ICD-10-CM | POA: Diagnosis not present

## 2018-07-02 DIAGNOSIS — Z9842 Cataract extraction status, left eye: Secondary | ICD-10-CM

## 2018-07-02 DIAGNOSIS — Z7401 Bed confinement status: Secondary | ICD-10-CM | POA: Diagnosis not present

## 2018-07-02 DIAGNOSIS — I10 Essential (primary) hypertension: Secondary | ICD-10-CM | POA: Diagnosis not present

## 2018-07-02 DIAGNOSIS — K8309 Other cholangitis: Secondary | ICD-10-CM | POA: Diagnosis not present

## 2018-07-02 DIAGNOSIS — M6281 Muscle weakness (generalized): Secondary | ICD-10-CM | POA: Diagnosis not present

## 2018-07-02 DIAGNOSIS — M7989 Other specified soft tissue disorders: Secondary | ICD-10-CM | POA: Diagnosis not present

## 2018-07-02 DIAGNOSIS — H353220 Exudative age-related macular degeneration, left eye, stage unspecified: Secondary | ICD-10-CM | POA: Diagnosis present

## 2018-07-02 DIAGNOSIS — R7301 Impaired fasting glucose: Secondary | ICD-10-CM | POA: Diagnosis present

## 2018-07-02 DIAGNOSIS — Z9841 Cataract extraction status, right eye: Secondary | ICD-10-CM

## 2018-07-02 DIAGNOSIS — B9681 Helicobacter pylori [H. pylori] as the cause of diseases classified elsewhere: Secondary | ICD-10-CM | POA: Diagnosis present

## 2018-07-02 DIAGNOSIS — Z9119 Patient's noncompliance with other medical treatment and regimen: Secondary | ICD-10-CM | POA: Diagnosis not present

## 2018-07-02 DIAGNOSIS — R932 Abnormal findings on diagnostic imaging of liver and biliary tract: Secondary | ICD-10-CM | POA: Diagnosis not present

## 2018-07-02 DIAGNOSIS — K750 Abscess of liver: Secondary | ICD-10-CM | POA: Diagnosis not present

## 2018-07-02 DIAGNOSIS — R627 Adult failure to thrive: Secondary | ICD-10-CM | POA: Diagnosis not present

## 2018-07-02 DIAGNOSIS — R1084 Generalized abdominal pain: Secondary | ICD-10-CM | POA: Diagnosis not present

## 2018-07-02 DIAGNOSIS — Z86010 Personal history of colonic polyps: Secondary | ICD-10-CM

## 2018-07-02 DIAGNOSIS — E860 Dehydration: Secondary | ICD-10-CM | POA: Diagnosis not present

## 2018-07-02 DIAGNOSIS — I4510 Unspecified right bundle-branch block: Secondary | ICD-10-CM | POA: Diagnosis present

## 2018-07-02 DIAGNOSIS — B954 Other streptococcus as the cause of diseases classified elsewhere: Secondary | ICD-10-CM | POA: Diagnosis not present

## 2018-07-02 DIAGNOSIS — B951 Streptococcus, group B, as the cause of diseases classified elsewhere: Secondary | ICD-10-CM | POA: Diagnosis not present

## 2018-07-02 DIAGNOSIS — R197 Diarrhea, unspecified: Secondary | ICD-10-CM | POA: Diagnosis present

## 2018-07-02 DIAGNOSIS — R7989 Other specified abnormal findings of blood chemistry: Secondary | ICD-10-CM | POA: Diagnosis present

## 2018-07-02 DIAGNOSIS — Z66 Do not resuscitate: Secondary | ICD-10-CM | POA: Diagnosis present

## 2018-07-02 DIAGNOSIS — R488 Other symbolic dysfunctions: Secondary | ICD-10-CM | POA: Diagnosis not present

## 2018-07-02 DIAGNOSIS — Z961 Presence of intraocular lens: Secondary | ICD-10-CM | POA: Diagnosis present

## 2018-07-02 DIAGNOSIS — R945 Abnormal results of liver function studies: Secondary | ICD-10-CM | POA: Diagnosis present

## 2018-07-02 DIAGNOSIS — L89152 Pressure ulcer of sacral region, stage 2: Secondary | ICD-10-CM | POA: Diagnosis not present

## 2018-07-02 DIAGNOSIS — K769 Liver disease, unspecified: Secondary | ICD-10-CM | POA: Diagnosis not present

## 2018-07-02 DIAGNOSIS — L8990 Pressure ulcer of unspecified site, unspecified stage: Secondary | ICD-10-CM

## 2018-07-02 DIAGNOSIS — R52 Pain, unspecified: Secondary | ICD-10-CM | POA: Diagnosis not present

## 2018-07-02 DIAGNOSIS — N200 Calculus of kidney: Secondary | ICD-10-CM | POA: Diagnosis not present

## 2018-07-02 DIAGNOSIS — D7589 Other specified diseases of blood and blood-forming organs: Secondary | ICD-10-CM | POA: Diagnosis present

## 2018-07-02 DIAGNOSIS — R2689 Other abnormalities of gait and mobility: Secondary | ICD-10-CM | POA: Diagnosis not present

## 2018-07-02 DIAGNOSIS — N401 Enlarged prostate with lower urinary tract symptoms: Secondary | ICD-10-CM | POA: Diagnosis not present

## 2018-07-02 DIAGNOSIS — Z79899 Other long term (current) drug therapy: Secondary | ICD-10-CM

## 2018-07-02 DIAGNOSIS — L270 Generalized skin eruption due to drugs and medicaments taken internally: Secondary | ICD-10-CM | POA: Diagnosis not present

## 2018-07-02 LAB — HEPATIC FUNCTION PANEL
ALT: 49 U/L — AB (ref 0–44)
AST: 45 U/L — ABNORMAL HIGH (ref 15–41)
Albumin: 2.3 g/dL — ABNORMAL LOW (ref 3.5–5.0)
Alkaline Phosphatase: 149 U/L — ABNORMAL HIGH (ref 38–126)
BILIRUBIN INDIRECT: 0.9 mg/dL (ref 0.3–0.9)
Bilirubin, Direct: 0.5 mg/dL — ABNORMAL HIGH (ref 0.0–0.2)
TOTAL PROTEIN: 6.8 g/dL (ref 6.5–8.1)
Total Bilirubin: 1.4 mg/dL — ABNORMAL HIGH (ref 0.3–1.2)

## 2018-07-02 LAB — CBC
HCT: 46.3 % (ref 39.0–52.0)
Hemoglobin: 14.8 g/dL (ref 13.0–17.0)
MCH: 32.2 pg (ref 26.0–34.0)
MCHC: 32 g/dL (ref 30.0–36.0)
MCV: 100.9 fL — ABNORMAL HIGH (ref 80.0–100.0)
PLATELETS: 318 10*3/uL (ref 150–400)
RBC: 4.59 MIL/uL (ref 4.22–5.81)
RDW: 12.3 % (ref 11.5–15.5)
WBC: 15.9 10*3/uL — ABNORMAL HIGH (ref 4.0–10.5)
nRBC: 0 % (ref 0.0–0.2)

## 2018-07-02 LAB — BASIC METABOLIC PANEL
ANION GAP: 9 (ref 5–15)
BUN: 10 mg/dL (ref 8–23)
CALCIUM: 8.6 mg/dL — AB (ref 8.9–10.3)
CO2: 28 mmol/L (ref 22–32)
Chloride: 100 mmol/L (ref 98–111)
Creatinine, Ser: 0.98 mg/dL (ref 0.61–1.24)
GLUCOSE: 121 mg/dL — AB (ref 70–99)
Potassium: 3.4 mmol/L — ABNORMAL LOW (ref 3.5–5.1)
SODIUM: 137 mmol/L (ref 135–145)

## 2018-07-02 LAB — LIPASE, BLOOD: LIPASE: 35 U/L (ref 11–51)

## 2018-07-02 MED ORDER — IOHEXOL 300 MG/ML  SOLN
100.0000 mL | Freq: Once | INTRAMUSCULAR | Status: AC | PRN
  Administered 2018-07-02: 100 mL via INTRAVENOUS

## 2018-07-02 MED ORDER — PANTOPRAZOLE SODIUM 40 MG IV SOLR
40.0000 mg | INTRAVENOUS | Status: DC
  Administered 2018-07-03: 40 mg via INTRAVENOUS
  Filled 2018-07-02: qty 40

## 2018-07-02 MED ORDER — PIPERACILLIN-TAZOBACTAM 3.375 G IVPB 30 MIN
3.3750 g | Freq: Once | INTRAVENOUS | Status: AC
  Administered 2018-07-02: 3.375 g via INTRAVENOUS
  Filled 2018-07-02: qty 50

## 2018-07-02 MED ORDER — ONDANSETRON HCL 4 MG PO TABS: 4.0000 mg | ORAL_TABLET | Freq: Four times a day (QID) | ORAL | Status: DC | PRN

## 2018-07-02 MED ORDER — ACETAMINOPHEN 325 MG PO TABS: 650.0000 mg | ORAL_TABLET | Freq: Four times a day (QID) | ORAL | Status: DC | PRN

## 2018-07-02 MED ORDER — SODIUM CHLORIDE 0.9 % IV BOLUS
1000.0000 mL | Freq: Once | INTRAVENOUS | Status: AC
  Administered 2018-07-02: 1000 mL via INTRAVENOUS

## 2018-07-02 MED ORDER — SODIUM CHLORIDE 0.9% FLUSH: 3.0000 mL | Freq: Once | INTRAVENOUS | Status: DC

## 2018-07-02 MED ORDER — PROSIGHT PO TABS
1.0000 | ORAL_TABLET | Freq: Every day | ORAL | Status: DC
  Administered 2018-07-03 – 2018-07-08 (×6): 1 via ORAL
  Filled 2018-07-02 (×6): qty 1

## 2018-07-02 MED ORDER — LACTATED RINGERS IV SOLN
INTRAVENOUS | Status: DC
  Administered 2018-07-02 – 2018-07-06 (×2): via INTRAVENOUS

## 2018-07-02 MED ORDER — ONDANSETRON HCL 4 MG/2ML IJ SOLN: 4.0000 mg | Freq: Four times a day (QID) | INTRAMUSCULAR | Status: DC | PRN

## 2018-07-02 MED ORDER — MORPHINE SULFATE (PF) 2 MG/ML IV SOLN
2.0000 mg | INTRAVENOUS | Status: DC | PRN
  Administered 2018-07-03 – 2018-07-04 (×2): 2 mg via INTRAVENOUS
  Filled 2018-07-02 (×2): qty 1

## 2018-07-02 MED ORDER — ACETAMINOPHEN 650 MG RE SUPP: 650.0000 mg | Freq: Four times a day (QID) | RECTAL | Status: DC | PRN

## 2018-07-02 MED ORDER — PIPERACILLIN-TAZOBACTAM 3.375 G IVPB
3.3750 g | Freq: Three times a day (TID) | INTRAVENOUS | Status: DC
  Administered 2018-07-02 – 2018-07-05 (×9): 3.375 g via INTRAVENOUS
  Filled 2018-07-02 (×9): qty 50

## 2018-07-02 MED ORDER — BOOST / RESOURCE BREEZE PO LIQD CUSTOM
1.0000 | Freq: Three times a day (TID) | ORAL | Status: DC
  Administered 2018-07-02 – 2018-07-08 (×17): 1 via ORAL

## 2018-07-02 MED ORDER — HYDRALAZINE HCL 20 MG/ML IJ SOLN: 5.0000 mg | INTRAMUSCULAR | Status: DC | PRN

## 2018-07-02 NOTE — Telephone Encounter (Signed)
Dr Fransisco Beau said that pt is to see surgeon on 07/03/18 but pt condition has worsened. Pt is pale, cannot walk down hallway, incontinent of urine; for 2-3 days pt has had vomiting and diarrhea and is unable to keep liquids down. Dr Fransisco Beau is concerned pt could be dehydrated. Dr Fransisco Beau will have someone take pt to ED now and [facility] will let [facility] [place] know pt is going to [facility] ED.  Regina LPN received call from pts sister requesting a note for work for Bary Leriche to take pt to [facility] ED today. [name] request cb [phone].

## 2018-07-02 NOTE — ED Notes (Signed)
ED TO INPATIENT HANDOFF REPORT  ED Nurse Name and Phone #: Alycia Rossetti [phone]  S Name/Age/Gender Villa Herb 84 y.o. male Room/Bed: H021C/H021C  Code Status   Code Status: DNR  Home/SNF/Other Home Patient oriented to: self, place, time and situation Is this baseline? Yes   Triage Complete: Triage complete  Chief Complaint sent by dr/poss dehydrated  Triage Note Pt was sent to the ED by his PCP for dehydration. Pt reports he has been feeling fatigued X2 weeks. Skin warm an dry. No distress noted.    Allergies No Known Allergies  Level of Care/Admitting Diagnosis ED Disposition    ED Disposition Condition Comment   Admit  Hospital Area: [facility] [[id number]]  Level of Care: Med-Surg [16]  Diagnosis: Abnormal LFTs [147829]  Admitting Physician: Jonah Blue [[id number]]  Attending Physician: Jonah Blue [[id number]]  Estimated length of stay: 3 - 4 days  Certification:: I certify this patient will need inpatient services for at least 2 midnights  PT Class (Do Not Modify): Inpatient [101]  PT Acc Code (Do Not Modify): Private [1]       B Medical/Surgery History Past Medical History:  Diagnosis Date  . Angiodysplasia 02/13/2006  . Arthritis    knees  . Benign prostatic hypertrophy   . Colon polyps   . Diverticulosis of colon   . Hypertension   . Macular degeneration, wet (HCC)    Dr Chyrl Civatte  . Obesity   . Peripheral neuropathy    feet  . Sleep apnea    DX'd when younger. declined CPAP.   Past Surgical History:  Procedure Laterality Date  . APPENDECTOMY    . CATARACT EXTRACTION W/PHACO Left 03/05/2018   Procedure: CATARACT EXTRACTION PHACO AND INTRAOCULAR LENS PLACEMENT (IOC) LEFT;  Surgeon: Nevada Crane, MD;  Location: [facility];  Service: Ophthalmology;  Laterality: Left;  sleep apnea  . CATARACT EXTRACTION W/PHACO Right 03/19/2018   Procedure: CATARACT EXTRACTION PHACO AND INTRAOCULAR LENS PLACEMENT (IOC);  Surgeon:  N[place] Crane, MD;  Location: [facility];  Service: Ophthalmology;  Laterality: Right;  . KNEE ARTHROSCOPY     BOTH KNEES  . MASTOID DEBRIDEMENT    . PILONIDAL CYST EXCISION  1960  . TONSILLECTOMY       A IV Location/Drains/Wounds Patient Lines/Drains/Airways Status   Active Line/Drains/Airways    Name:   Placement date:   Placement time:   Site:   Days:   Peripheral IV 07/02/18 Right Forearm   07/02/18    1115    Forearm   less than 1   Incision (Closed) 03/05/18 Eye   03/05/18    0843     119   Incision (Closed) 03/19/18 Eye Right   03/19/18    1229     105          Intake/Output Last 24 hours  Intake/Output Summary (Last 24 hours) at 07/02/2018 1801 Last data filed at 07/02/2018 1658 Gross per 24 hour  Intake 1174.75 ml  Output -  Net 1174.75 ml    Labs/Imaging Results for orders placed or performed during the hospital encounter of 07/02/18 (from the past 48 hour(s))  Basic metabolic panel     Status: Abnormal   Collection Time: 07/02/18 10:11 AM  Result Value Ref Range   Sodium 137 135 - 145 mmol/L   Potassium 3.4 (L) 3.5 - 5.1 mmol/L   Chloride 100 98 - 111 mmol/L   CO2 28 22 - 32 mmol/L   Glucose,  Bld 121 (H) 70 - 99 mg/dL   BUN 10 8 - 23 mg/dL   Creatinine, Ser 7.71 0.61 - 1.24 mg/dL   Calcium 8.6 (L) 8.9 - 10.3 mg/dL   GFR calc non Af Amer >60 >60 mL/min   GFR calc Af Amer >60 >60 mL/min   Anion gap 9 5 - 15    Comment: Performed at [facility], [address] [place], [place] [address]  CBC     Status: Abnormal   Collection Time: 07/02/18 10:11 AM  Result Value Ref Range   WBC 15.9 (H) 4.0 - 10.5 K/uL   RBC 4.59 4.22 - 5.81 MIL/uL   Hemoglobin 14.8 13.0 - 17.0 g/dL   HCT 03.8 33.3 - 83.2 %   MCV 100.9 (H) 80.0 - 100.0 fL   MCH 32.2 26.0 - 34.0 pg   MCHC 32.0 30.0 - 36.0 g/dL   RDW 91.9 16.6 - 06.0 %   Platelets 318 150 - 400 K/uL   nRBC 0.0 0.0 - 0.2 %    Comment: Performed at [facility], [address],  [place], [place] [address]  Hepatic function panel     Status: Abnormal   Collection Time: 07/02/18 10:11 AM  Result Value Ref Range   Total Protein 6.8 6.5 - 8.1 g/dL   Albumin 2.3 (L) 3.5 - 5.0 g/dL   AST 45 (H) 15 - 41 U/L   ALT 49 (H) 0 - 44 U/L   Alkaline Phosphatase 149 (H) 38 - 126 U/L   Total Bilirubin 1.4 (H) 0.3 - 1.2 mg/dL   Bilirubin, Direct 0.5 (H) 0.0 - 0.2 mg/dL   Indirect Bilirubin 0.9 0.3 - 0.9 mg/dL    Comment: Performed at [facility], [address] [place], [place] [address]  Lipase, blood     Status: None   Collection Time: 07/02/18 10:11 AM  Result Value Ref Range   Lipase 35 11 - 51 U/L    Comment: Performed at [facility], [address], [place], [place] [address]   Dg Chest 2 View  Result Date: 07/02/2018 CLINICAL DATA:  84 year old male with dehydration EXAM: CHEST - 2 VIEW COMPARISON:  None. FINDINGS: Cardiomediastinal silhouette demonstrates cardiomegaly. Low lung volumes. No pneumothorax. No confluent airspace disease. No pleural effusion. Mildly coarsened interstitial markings. No interlobular septal thickening or evidence of central vascular congestion. No displaced fracture.  Degenerative changes of the spine. IMPRESSION: Low lung volumes with likely chronic changes and no evidence of acute cardiopulmonary disease Electronically Signed   By: Gilmer Mor D.O.   On: 07/02/2018 14:00   Ct Abdomen Pelvis W Contrast  Result Date: 07/02/2018 CLINICAL DATA:  Evaluate dehydration, suspected cholecystitis EXAM: CT ABDOMEN AND PELVIS WITH CONTRAST TECHNIQUE: Multidetector CT imaging of the abdomen and pelvis was performed using the standard protocol following bolus administration of intravenous contrast. CONTRAST:  OMNIPAQUE IOHEXOL 300 MG/ML  SOLN COMPARISON:  Abdominal ultrasound, 07/02/2018 FINDINGS: Lower chest: No acute abnormality. Hepatobiliary: There are large air and fluid containing lesions of the left lobe of the liver, the larger measuring  at least 14.7 cm and a smaller adjacent lesion measuring 3.8 cm (series 3, image 14). No gallstones, gallbladder wall thickening, or biliary dilatation. Pancreas: Unremarkable. No pancreatic ductal dilatation or surrounding inflammatory changes. Spleen: Normal in size without focal abnormality. Adrenals/Urinary Tract: Adrenal glands are unremarkable. Kidneys are normal, without renal calculi, focal lesion, or hydronephrosis. Thickening of the urinary bladder, likely  due to chronic outlet obstruction. Stomach/Bowel: Stomach is within normal limits. Appendix not clearly visualized. No evidence of bowel wall thickening, distention, or inflammatory changes. Vascular/Lymphatic: No significant vascular findings are present. No enlarged abdominal or pelvic lymph nodes. Reproductive: Prostatomegaly. Other: No abdominal wall hernia or abnormality. No abdominopelvic ascites. Musculoskeletal: No acute or significant osseous findings. IMPRESSION: 1. There are large air and fluid containing lesions of the left lobe of the liver, the larger measuring at least 14.7 cm and a smaller adjacent lesion measuring 3.8 cm (series 3, image 14). It is unclear whether one or both of these lesions communicate to the biliary system. The presence of air is concerning for liver abscess. As on prior ultrasound examinations, contrast enhanced MRI may be useful to further characterize. 2. Gallstones described on prior ultrasound examinations are not well appreciated by CT. Electronically Signed   By: Lauralyn PrimesAlex  Bibbey M.D.   On: 07/02/2018 15:58   [place] Abdomen Limited Ruq  Result Date: 07/02/2018 CLINICAL DATA:  Right upper quadrant pain. EXAM: ULTRASOUND ABDOMEN LIMITED RIGHT UPPER QUADRANT COMPARISON:  Ultrasound June 28, 2018 FINDINGS: Gallbladder: There are multiple stones in the gallbladder with the largest measuring 1.8 cm. The gallbladder is contracted. No wall thickening or Murphy's sign. No pericholecystic fluid. Common bile duct:  Diameter: 7.3 mm Liver: There are 2 complicated cystic masses in the liver. The first in the left hepatic lobe measures 11 x 9 x 9 cm. The smaller adjacent mass measures 4 x 2.4 x 2.9 cm. Portal vein is patent on color Doppler imaging with normal direction of blood flow towards the liver. IMPRESSION: 1. The gallbladder is contracted limiting evaluation. The gallbladder contains stones with the largest measuring 1.8 cm. No wall thickening, pericholecystic fluid, or Murphy's sign. 2. The common bile duct is borderline measuring 7.3 mm. Recommend correlation with labs. 3. 2 complex cystic masses in the left hepatic lobe with the largest measuring 11 cm. Recommend either an MRI with and without contrast or a contrast-enhanced CT scan for further evaluation. An MRI is favored if the patient is MRI compatible. Electronically Signed   By: Gerome Samavid  Williams III M.D   On: 07/02/2018 14:23    Pending Labs Unresulted Labs (From admission, onward)    Start     Ordered   07/03/18 0500  Basic metabolic panel  Tomorrow morning,   R     07/02/18 1646   07/03/18 0500  CBC  Tomorrow morning,   R     07/02/18 1646   07/03/18 0500  Hepatic function panel  Tomorrow morning,   R     07/02/18 1711   07/02/18 1751  Hemoglobin A1c  Once,   R     07/02/18 1751          Vitals/Pain Today's Vitals   07/02/18 1130 07/02/18 1200 07/02/18 1300 07/02/18 1515  BP: (!) 136/31 135/75 (!) 147/109 (!) 144/55  Pulse: (!) 113 90 (!) 102 91  Resp:  16  16  Temp:      TempSrc:      SpO2: 92% 93% 92% 94%  PainSc:        Isolation Precautions No active isolations  Medications Medications  piperacillin-tazobactam (ZOSYN) IVPB 3.375 g (has no administration in time range)  multivitamin-lutein (OCUVITE-LUTEIN) capsule 1 capsule (has no administration in time range)  acetaminophen (TYLENOL) tablet 650 mg (has no administration in time range)    Or  acetaminophen (TYLENOL) suppository 650 mg (has no administration in time  range)  ondansetron (ZOFRAN) tablet 4 mg (has no administration in time range)    Or  ondansetron (ZOFRAN) injection 4 mg (has no administration in time range)  lactated ringers infusion ( Intravenous New Bag/Given 07/02/18 1737)  morphine 2 MG/ML injection 2 mg (has no administration in time range)  hydrALAZINE (APRESOLINE) injection 5 mg (has no administration in time range)  pantoprazole (PROTONIX) injection 40 mg (has no administration in time range)  sodium chloride 0.9 % bolus 1,000 mL (0 mLs Intravenous Stopped 07/02/18 1504)  piperacillin-tazobactam (ZOSYN) IVPB 3.375 g (0 g Intravenous Stopped 07/02/18 1658)  iohexol (OMNIPAQUE) 300 MG/ML solution 100 mL (100 mLs Intravenous Contrast Given 07/02/18 1544)    Mobility walks with device Low fall risk   Focused Assessments Abdomen   R Recommendations: See Admitting Provider Note  Report given to:   Additional Notes:

## 2018-07-02 NOTE — ED Triage Notes (Signed)
Pt was sent to the ED by his PCP for dehydration. Pt reports he has been feeling fatigued X2 weeks. Skin warm an dry. No distress noted.

## 2018-07-02 NOTE — Telephone Encounter (Signed)
Patients son called here requesting we try to get patient seen today by [facility].he spoke to [place]. After talking to son, patients symptoms worsened over the weekend and now is jaundice and probably dehydrated.Rena recommended to go to [facility] ED. I called [facility] and spoke to Toniann Fail RN there and she will notify Dr Derrell Lolling the MD on call there that he is on his way there.His appt with them is tomorrow and she will cancel the appt if needed.

## 2018-07-02 NOTE — Consult Note (Addendum)
Referring Provider: [facility]   Primary Care Physician:  Venia Carbon, MD Primary Gastroenterologist: Lucio Edward, MD     Reason for Consultation:  Abnormal liver tests, dilated bile duct  ASSESSMENT / PLAN:    41. 84 yo male with almost constant nausea since Super Bowel. This has been associated with very poor PO intake. Gallbladder disease? WBC elevated. The RUQ U/S suggests mildly thickened gallbladder wall,  cholelithiasis. His CBD is borderline enlarged at 7.3 mm so choledocholithiasis needs to be ruled out though unusual in absence of pain. Tbili elevation is predominantly indirect.  -patient has already had a CT scan - await results. Depending on clinical course he may need MRI to exclude CBD stone and also further evaluate #2.  -am liver tests, CBC -PPI. PUD as cause of nausea is also possibility. H.pylori IgG positive but could be from previous H.pylori infection. Would not treat with antibiotics yets.  -supportive care with IVF, anti-emetics -Surgery already called and here to evaluate now  2. Liver lesions. Two complex cystic masses in left liver lobe, largest measuring 11 cm -should get MRI at some point to better characterize lesions.   3. Hx of adenomatous colon polyps (2007). Mailed patient letter recommending colon recall in 2012     Attending Physician Note   I have taken a history, examined the patient and reviewed the chart. I agree with the Advanced Practitioner's note, impression and recommendations.   Anorexia, poor PO intake and nausea since the Super Bowl. Leukocytosis. US shows cholelithiasis and thickened GB wall, normal CBD. AST, ALT, Alk phos and t bili all minimally elevated. CT shows large lesions with A/F levels concerning for hepatic abscesses. Surgery has been consulted. IV antibiotics, consult IR for drainage/aspiration. LFTs elevated due to hepatic lesions. Trend LFTs.   Lucio Edward, MD [facility] [phone]    HPI:      HPI:  Brad Reed is a 84 y.o. male known remotely to Dr. Fuller Plan. He has a remote hx of colon AVMs, diverticulosis and colon polyps. Patient presented to ED this am with generalized weakness and nausea.   06/25/18 saw PCP- . Got labs and alk phos 122,  Tbili 1.2, AST 46, ALT 44. WBC 15.8.  H.pylori IgG positive. Sent for RUQ u/s which revealed cholelithiasis and mildly thickened gallbladder. No pericholecystic fluid. The CBD borderline enlarged measuring 7.3 mm. Started on twice daily PPI , NSAIDs discontinued, referred to [facility] ( appt tomorrow ).  Due to worsening symptoms worsen, he presented to ED. WBC still elevated. Liver tests mildly elevated. Repeat U/S with similar findings. CBD 7.3 mm.   Patient denies abdominal pain. It has been struggling with almost constant nausea since Super Bowl.  Patient does not think he has lost any weight despite very poor p.o. intake since onset of nausea.  Patient was taking Ibuprofen TID for a few weeks leading up to the onset of nausea.  He had not started any new medications prior to the onset of nausea . No improvement nausea after PCP started him on PPI a week or so ago.   No lower GI complaints such as constipation or blood in stool.  There is mention of diarrhea in recent notes but patient denies.  He has frequent urination without dysuria.  No blood in his stool    ED workup:   Afebrile. Tachycardic Tbili 1.4, indirect bili 0.9, alk phos 149, AST 45, ALT 49. Historically liver test have been normal.  WBC 15.9  RUQ U/S 07/02/2008 IMPRESSION: 1. The gallbladder is contracted limiting evaluation. The gallbladder contains stones with the largest measuring 1.8 cm. No wall thickening, pericholecystic fluid, or Murphy's sign. 2. The common bile duct is borderline measuring 7.3 mm. Recommend correlation with labs. 3. 2 complex cystic masses in the left hepatic lobe with the largest measuring 11 cm. Recommend either an MRI with and without contrast or a  contrast-enhanced CT scan for further evaluation. An MRI is favored if the patient is MRI compatible     Past Medical History:  Diagnosis Date  . Angiodysplasia 02/13/2006  . Arthritis    knees  . Benign prostatic hypertrophy   . Colon polyps   . Diverticulosis of colon   . Hypertension   . Macular degeneration, wet ([place])    Dr Dwain Sarna  . Obesity   . Peripheral neuropathy    feet  . Sleep apnea    DX'd when younger. declined CPAP.    Past Surgical History:  Procedure Laterality Date  . APPENDECTOMY    . CATARACT EXTRACTION W/PHACO Left 03/05/2018   Procedure: CATARACT EXTRACTION PHACO AND INTRAOCULAR LENS PLACEMENT ([place]) LEFT;  Surgeon: Eulogio Bear, MD;  Location: [place];  Service: Ophthalmology;  Laterality: Left;  sleep apnea  . CATARACT EXTRACTION W/PHACO Right 03/19/2018   Procedure: CATARACT EXTRACTION PHACO AND INTRAOCULAR LENS PLACEMENT ([facility]);  Surgeon: Eulogio Bear, MD;  Location: [place];  Service: Ophthalmology;  Laterality: Right;  . KNEE ARTHROSCOPY     BOTH KNEES  . MASTOID DEBRIDEMENT    . PILONIDAL CYST EXCISION  1960  . TONSILLECTOMY      Prior to Admission medications   Medication Sig Start Date End Date Taking? Authorizing Provider  Ibuprofen 200 MG CAPS Take 200 mg by mouth as needed (pain).  05/31/10  Yes [provider]  multivitamin-lutein (OCUVITE-LUTEIN) CAPS capsule Take 1 capsule by mouth daily.   Yes [provider]  omeprazole (PRILOSEC) 20 MG capsule Take 1 capsule (20 mg total) by mouth 2 (two) times daily before a meal. 06/25/18  Yes Viviana Simpler I, MD  potassium chloride (K-DUR) 10 MEQ tablet TAKE 1 TABLET (10 MEQ TOTAL) BY MOUTH 2 (TWO) TIMES DAILY. Patient taking differently: Take 10 mEq by mouth daily.  09/14/16  Yes Venia Carbon, MD  triamterene-hydrochlorothiazide (DYAZIDE) 37.5-25 MG capsule TAKE ONE CAPSULE BY MOUTH EVERY MORNING Patient taking differently: Take 1  capsule by mouth daily.  03/14/18  Yes Venia Carbon, MD    Current Facility-Administered Medications  Medication Dose Route Frequency Provider Last Rate Last Dose  . iohexol (OMNIPAQUE) 300 MG/ML solution 100 mL  100 mL Intravenous Once PRN Duffy Bruce, MD      . piperacillin-tazobactam (ZOSYN) IVPB 3.375 g  3.375 g Intravenous Once Bertis Ruddy, RPH 100 mL/hr at 07/02/18 1513 3.375 g at 07/02/18 1513  . piperacillin-tazobactam (ZOSYN) IVPB 3.375 g  3.375 g Intravenous Q8H Bertis Ruddy, RPH      . sodium chloride flush (NS) 0.9 % injection 3 mL  3 mL Intravenous Once Duffy Bruce, MD       Current Outpatient Medications  Medication Sig Dispense Refill  . Ibuprofen 200 MG CAPS Take 200 mg by mouth as needed (pain).     . multivitamin-lutein (OCUVITE-LUTEIN) CAPS capsule Take 1 capsule by mouth daily.    Marland Kitchen omeprazole (PRILOSEC) 20 MG capsule Take 1 capsule (20 mg total) by mouth 2 (two) times daily before a meal. 60 capsule  3  . potassium chloride (K-DUR) 10 MEQ tablet TAKE 1 TABLET (10 MEQ TOTAL) BY MOUTH 2 (TWO) TIMES DAILY. (Patient taking differently: Take 10 mEq by mouth daily. ) 180 tablet 2  . triamterene-hydrochlorothiazide (DYAZIDE) 37.5-25 MG capsule TAKE ONE CAPSULE BY MOUTH EVERY MORNING (Patient taking differently: Take 1 capsule by mouth daily. ) 90 capsule 3    Allergies as of 07/02/2018  . (No Known Allergies)    Family History  Problem Relation Age of Onset  . Cancer Mother   . Obesity Sister   . Diabetes Sister   . Diabetes Maternal Uncle     Social History   Socioeconomic History  . Marital status: Widowed    Spouse name: Not on file  . Number of children: 3  . Years of education: Not on file  . Highest education level: Not on file  Occupational History  . 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 Financial resource strain: Not on file  . Food insecurity:    Worry: Not on file     Inability: Not on file  . Transportation needs:    Medical: Not on file    Non-medical: Not on file  Tobacco Use  . Smoking status: Former Smoker    Types: Cigarettes    Last attempt to quit: 05/17/1983    Years since quitting: 35.1  . Smokeless tobacco: Never Used  Substance and Sexual Activity  . Alcohol use: Yes    Alcohol/week: 14.0 standard drinks    Types: 14 Shots of liquor per week    Comment: Drinks 2 cocktails /day  . Drug use: No  . Sexual activity: Not on file  Lifestyle  . Physical activity:    Days per week: Not on file    Minutes per session: Not on file  . Stress: Not on file  Relationships  . Social connections:    Talks on phone: Not on file    Gets together: Not on file    Attends religious service: Not on file    Active member of club or organization: Not on file    Attends meetings of clubs or organizations: Not on file    Relationship status: Not on file  . Intimate partner violence:    Fear of current or ex partner: Not on file    Emotionally abused: Not on file    Physically abused: Not on file    Forced sexual activity: Not on file  Other Topics Concern  . Not on file  Social History Narrative   Has living will    Son Monica Martinez is health care POA   Would accept resuscitation but no prolonged artificial life support   No tube feeds if cognitively unaware    Review of Systems: All systems reviewed and negative except where noted in HPI.  Physical Exam: Vital signs in last 24 hours: Temp:  [97.9 F (36.6 C)] 97.9 F (36.6 C) (02/17 1002) Pulse Rate:  [90-117] 91 (02/17 1515) Resp:  [16-18] 16 (02/17 1515) BP: (115-147)/(31-109) 144/55 (02/17 1515) SpO2:  [92 %-95 %] 94 % (02/17 1515)   General:   Alert, white male in NAD Psych:  Pleasant, cooperative. Normal mood and affect. Eyes:  Pupils equal, sclera clear, no icterus.   Conjunctiva pink. Ears:  Normal auditory acuity. Nose:  No deformity, discharge,  or lesions. Neck:  Supple; no  masses Lungs:  Clear throughout to auscultation.  No wheezes, crackles, or rhonchi.  Heart: Irreg rhythm; no murmurs, 2+ BLE edema Abdomen:  Soft, obese, nontender, BS active, no palp mass    Rectal:  Deferred  Msk:  Symmetrical without gross deformities. . Neurologic:  Alert and  oriented x4;  grossly normal neurologically. Skin:  Intact without significant lesions or rashes.   Intake/Output from previous day: No intake/output data recorded. Intake/Output this shift: Total I/O In: 1000 [IV Piggyback:1000] Out: -   Lab Results: Recent Labs    07/02/18 1011  WBC 15.9*  HGB 14.8  HCT 46.3  PLT 318   BMET Recent Labs    07/02/18 1011  NA 137  K 3.4*  CL 100  CO2 28  GLUCOSE 121*  BUN 10  CREATININE 0.98  CALCIUM 8.6*   LFT Recent Labs    07/02/18 1011  PROT 6.8  ALBUMIN 2.3*  AST 45*  ALT 49*  ALKPHOS 149*  BILITOT 1.4*  BILIDIR 0.5*  IBILI 0.9     Studies/Results: Dg Chest 2 View  Result Date: 07/02/2018 CLINICAL DATA:  84 year old male with dehydration EXAM: CHEST - 2 VIEW COMPARISON:  None. FINDINGS: Cardiomediastinal silhouette demonstrates cardiomegaly. Low lung volumes. No pneumothorax. No confluent airspace disease. No pleural effusion. Mildly coarsened interstitial markings. No interlobular septal thickening or evidence of central vascular congestion. No displaced fracture.  Degenerative changes of the spine. IMPRESSION: Low lung volumes with likely chronic changes and no evidence of acute cardiopulmonary disease Electronically Signed   By: Corrie Mckusick D.O.   On: 07/02/2018 14:00   US Abdomen Limited Ruq  Result Date: 07/02/2018 CLINICAL DATA:  Right upper quadrant pain. EXAM: ULTRASOUND ABDOMEN LIMITED RIGHT UPPER QUADRANT COMPARISON:  Ultrasound June 28, 2018 FINDINGS: Gallbladder: There are multiple stones in the gallbladder with the largest measuring 1.8 cm. The gallbladder is contracted. No wall thickening or Murphy's sign. No  pericholecystic fluid. Common bile duct: Diameter: 7.3 mm Liver: There are 2 complicated cystic masses in the liver. The first in the left hepatic lobe measures 11 x 9 x 9 cm. The smaller adjacent mass measures 4 x 2.4 x 2.9 cm. Portal vein is patent on color Doppler imaging with normal direction of blood flow towards the liver. IMPRESSION: 1. The gallbladder is contracted limiting evaluation. The gallbladder contains stones with the largest measuring 1.8 cm. No wall thickening, pericholecystic fluid, or Murphy's sign. 2. The common bile duct is borderline measuring 7.3 mm. Recommend correlation with labs. 3. 2 complex cystic masses in the left hepatic lobe with the largest measuring 11 cm. Recommend either an MRI with and without contrast or a contrast-enhanced CT scan for further evaluation. An MRI is favored if the patient is MRI compatible. Electronically Signed   By: Dorise Bullion III M.D   On: 07/02/2018 14:23     Tye Savoy, NP-C @  07/02/2018, 3:37 PM     I

## 2018-07-02 NOTE — Progress Notes (Signed)
Pharmacy Antibiotic Note  Brad Reed is a 84 y.o. male admitted on 07/02/2018 with intra-abdominal infection.  Pharmacy has been consulted for  Zosyn dosing.  AF, WBC 15.9, tachy.  SCr 0.98, CrCl ~ 80 mL/min  Plan: Zosyn 3.375g IV every 8 hours Monitor renal function, infectious workup and progression to narrow     Temp (24hrs), Avg:97.9 F (36.6 C), Min:97.9 F (36.6 C), Max:97.9 F (36.6 C)  Recent Labs  Lab 07/02/18 1011  WBC 15.9*  CREATININE 0.98    Estimated Creatinine Clearance: 81.5 mL/min (by C-G formula based on SCr of 0.98 mg/dL).    No Known Allergies  Antimicrobials this admission: Zosyn 2/17>>  Dose adjustments this admission: n/a  Microbiology results:  Daylene Posey, PharmD Clinical Pharmacist Please check AMION for all [facility] Pharmacy numbers 07/02/2018 3:03 PM

## 2018-07-02 NOTE — Telephone Encounter (Signed)
Okay---I will follow his progress in the ER 

## 2018-07-02 NOTE — ED Provider Notes (Signed)
[facility] EMERGENCY DEPARTMENT Provider Note   CSN: [id number] Arrival date & time: 07/02/18  3016     History   Chief Complaint Chief Complaint  Patient presents with  . Dehydration    HPI Brad Reed is a 84 y.o. male with a past medical history of BPH, diverticulosis, cholelithiasis, hypertension who presents to ED for 2-week history of generalized weakness, nausea, decreased appetite, intermittent abdominal pain, diarrhea.  He was evaluated by his PCP 1 week ago, ultrasound of right upper quadrant was completed which showed cholelithiasis and thickened gallbladder wall.  He is scheduled to meet with general surgery tomorrow for consult.  He admits to being noncompliant with his home triamterene HCTZ.  He was started on omeprazole but he continues to have decreased appetite, nausea.  Has not noticed that his lower extremities are more swollen than usual.  Denies chest pain, vomiting, changes to urination, fever, URI symptoms, head injuries.  Denies prior abdominal surgeries.  HPI  Past Medical History:  Diagnosis Date  . Angiodysplasia 02/13/2006  . Arthritis    knees  . Benign prostatic hypertrophy   . Colon polyps   . Diverticulosis of colon   . Hypertension   . Macular degeneration, wet (HCC)    Dr Chyrl Civatte  . Obesity   . Peripheral neuropathy    feet  . Sleep apnea    DX'd when younger. declined CPAP.    Patient Active Problem List   Diagnosis Date Noted  . RUQ abdominal pain 06/25/2018  . Arrhythmia 09/14/2016  . Impaired fasting glucose 08/26/2014  . Advance directive discussed with patient 08/26/2014  . Peripheral neuropathy   . Pain in limb 02/25/2013  . Dermatophytosis of nail 02/25/2013  . Routine general medical examination at a health care facility 08/16/2011  . Macular degeneration, wet (HCC)   . Obesity, Class III, BMI 40-49.9 (morbid obesity) (HCC) 08/03/2010  . BPH with obstruction/lower urinary tract symptoms  12/24/2007  . Essential hypertension, benign 12/13/2006  . DIVERTICULOSIS, COLON 12/13/2006    Past Surgical History:  Procedure Laterality Date  . APPENDECTOMY    . CATARACT EXTRACTION W/PHACO Left 03/05/2018   Procedure: CATARACT EXTRACTION PHACO AND INTRAOCULAR LENS PLACEMENT (IOC) LEFT;  Surgeon: Nevada Crane, MD;  Location: [facility];  Service: Ophthalmology;  Laterality: Left;  sleep apnea  . CATARACT EXTRACTION W/PHACO Right 03/19/2018   Procedure: CATARACT EXTRACTION PHACO AND INTRAOCULAR LENS PLACEMENT (IOC);  Surgeon: Nevada Crane, MD;  Location: [facility];  Service: Ophthalmology;  Laterality: Right;  . KNEE ARTHROSCOPY     BOTH KNEES  . MASTOID DEBRIDEMENT    . PILONIDAL CYST EXCISION  1960  . TONSILLECTOMY          Home Medications    Prior to Admission medications   Medication Sig Start Date End Date Taking? Authorizing Provider  Ibuprofen 200 MG CAPS Take 200 mg by mouth as needed (pain).  05/31/10  Yes [provider]  multivitamin-lutein (OCUVITE-LUTEIN) CAPS capsule Take 1 capsule by mouth daily.   Yes [provider]  omeprazole (PRILOSEC) 20 MG capsule Take 1 capsule (20 mg total) by mouth 2 (two) times daily before a meal. 06/25/18  Yes Tillman Abide I, MD  potassium chloride (K-DUR) 10 MEQ tablet TAKE 1 TABLET (10 MEQ TOTAL) BY MOUTH 2 (TWO) TIMES DAILY. Patient taking differently: Take 10 mEq by mouth daily.  09/14/16  Yes Karie Schwalbe, MD  triamterene-hydrochlorothiazide (DYAZIDE) 37.5-25 MG capsule TAKE  ONE CAPSULE BY MOUTH EVERY MORNING Patient taking differently: Take 1 capsule by mouth daily.  03/14/18  Yes Karie Schwalbe, MD    Family History Family History  Problem Relation Age of Onset  . Cancer Mother   . Obesity Sister   . Diabetes Sister   . Diabetes Maternal Uncle     Social History Social History   Tobacco Use  . Smoking status: Former Smoker    Types: Cigarettes    Last  attempt to quit: 05/17/1983    Years since quitting: 35.1  . Smokeless tobacco: Never Used  Substance Use Topics  . Alcohol use: Yes    Alcohol/week: 14.0 standard drinks    Types: 14 Shots of liquor per week    Comment: Drinks 2 cocktails /day  . Drug use: No     Allergies   Patient has no known allergies.   Review of Systems Review of Systems  Constitutional: Positive for appetite change and fatigue. Negative for chills and fever.  HENT: Negative for ear pain, rhinorrhea, sneezing and sore throat.   Eyes: Negative for photophobia and visual disturbance.  Respiratory: Negative for cough, chest tightness, shortness of breath and wheezing.   Cardiovascular: Negative for chest pain and palpitations.  Gastrointestinal: Positive for diarrhea and nausea. Negative for abdominal pain, blood in stool, constipation and vomiting.  Genitourinary: Negative for dysuria, hematuria and urgency.  Musculoskeletal: Negative for myalgias.  Skin: Negative for rash.  Neurological: Negative for dizziness, weakness and light-headedness.     Physical Exam Updated Vital Signs BP (!) 144/55 (BP Location: Right Arm)   Pulse 91   Temp 97.9 F (36.6 C) (Oral)   Resp 16   SpO2 94%   Physical Exam Vitals signs and nursing note reviewed.  Constitutional:      General: He is not in acute distress.    Appearance: He is well-developed.  HENT:     Head: Normocephalic and atraumatic.     Nose: Nose normal.  Eyes:     General: No scleral icterus.       Right eye: No discharge.        Left eye: No discharge.     Conjunctiva/sclera: Conjunctivae normal.     Pupils: Pupils are equal, round, and reactive to light.  Neck:     Musculoskeletal: Normal range of motion and neck supple.  Cardiovascular:     Rate and Rhythm: Normal rate and regular rhythm.     Heart sounds: Normal heart sounds. No murmur. No friction rub. No gallop.   Pulmonary:     Effort: Pulmonary effort is normal. No respiratory  distress.     Breath sounds: Normal breath sounds.  Abdominal:     General: Bowel sounds are normal. There is no distension.     Palpations: Abdomen is soft.     Tenderness: There is abdominal tenderness (Epigastric). There is no guarding.  Musculoskeletal: Normal range of motion.     Comments: 1+ pitting edema in bilateral ankles.  No calf tenderness or erythema bilaterally.  Skin:    General: Skin is warm and dry.     Findings: No rash.  Neurological:     Mental Status: He is alert.     Motor: No abnormal muscle tone.     Coordination: Coordination normal.      ED Treatments / Results  Labs (all labs ordered are listed, but only abnormal results are displayed) Labs Reviewed  BASIC METABOLIC PANEL - Abnormal; Notable for the  following components:      Result Value   Potassium 3.4 (*)    Glucose, Bld 121 (*)    Calcium 8.6 (*)    All other components within normal limits  CBC - Abnormal; Notable for the following components:   WBC 15.9 (*)    MCV 100.9 (*)    All other components within normal limits  HEPATIC FUNCTION PANEL - Abnormal; Notable for the following components:   Albumin 2.3 (*)    AST 45 (*)    ALT 49 (*)    Alkaline Phosphatase 149 (*)    Total Bilirubin 1.4 (*)    Bilirubin, Direct 0.5 (*)    All other components within normal limits  LIPASE, BLOOD  URINALYSIS, ROUTINE W REFLEX MICROSCOPIC    EKG EKG Interpretation  Date/Time:  Monday July 02 2018 10:14:35 EST Ventricular Rate:  122 PR Interval:    QRS Duration: 94 QT Interval:  352 QTC Calculation: 501 R Axis:   -106 Text Interpretation:  Atrial fibrillation with rapid ventricular response Right superior axis deviation Incomplete right bundle branch block Right ventricular hypertrophy Possible Anterior infarct , age undetermined Abnormal ECG No old tracing to compare Confirmed by Shaune PollackIsaacs, Cameron [phone]([id number]) on 07/02/2018 12:02:43 PM   Radiology Dg Chest 2 View  Result Date:  07/02/2018 CLINICAL DATA:  84 year old male with dehydration EXAM: CHEST - 2 VIEW COMPARISON:  None. FINDINGS: Cardiomediastinal silhouette demonstrates cardiomegaly. Low lung volumes. No pneumothorax. No confluent airspace disease. No pleural effusion. Mildly coarsened interstitial markings. No interlobular septal thickening or evidence of central vascular congestion. No displaced fracture.  Degenerative changes of the spine. IMPRESSION: Low lung volumes with likely chronic changes and no evidence of acute cardiopulmonary disease Electronically Signed   By: Gilmer MorJaime  Wagner D.O.   On: 07/02/2018 14:00   [place] Abdomen Limited Ruq  Result Date: 07/02/2018 CLINICAL DATA:  Right upper quadrant pain. EXAM: ULTRASOUND ABDOMEN LIMITED RIGHT UPPER QUADRANT COMPARISON:  Ultrasound June 28, 2018 FINDINGS: Gallbladder: There are multiple stones in the gallbladder with the largest measuring 1.8 cm. The gallbladder is contracted. No wall thickening or Murphy's sign. No pericholecystic fluid. Common bile duct: Diameter: 7.3 mm Liver: There are 2 complicated cystic masses in the liver. The first in the left hepatic lobe measures 11 x 9 x 9 cm. The smaller adjacent mass measures 4 x 2.4 x 2.9 cm. Portal vein is patent on color Doppler imaging with normal direction of blood flow towards the liver. IMPRESSION: 1. The gallbladder is contracted limiting evaluation. The gallbladder contains stones with the largest measuring 1.8 cm. No wall thickening, pericholecystic fluid, or Murphy's sign. 2. The common bile duct is borderline measuring 7.3 mm. Recommend correlation with labs. 3. 2 complex cystic masses in the left hepatic lobe with the largest measuring 11 cm. Recommend either an MRI with and without contrast or a contrast-enhanced CT scan for further evaluation. An MRI is favored if the patient is MRI compatible. Electronically Signed   By: Gerome Samavid  Williams III M.D   On: 07/02/2018 14:23    Procedures Procedures (including  critical care time)  Medications Ordered in ED Medications  sodium chloride flush (NS) 0.9 % injection 3 mL (3 mLs Intravenous Not Given 07/02/18 1513)  piperacillin-tazobactam (ZOSYN) IVPB 3.375 g (3.375 g Intravenous New Bag/Given 07/02/18 1513)  piperacillin-tazobactam (ZOSYN) IVPB 3.375 g (has no administration in time range)  iohexol (OMNIPAQUE) 300 MG/ML solution 100 mL (has no administration in time range)  sodium chloride 0.9 %  bolus 1,000 mL (0 mLs Intravenous Stopped 07/02/18 1504)     Initial Impression / Assessment and Plan / ED Course  I have reviewed the triage vital signs and the nursing notes.  Pertinent labs & imaging results that were available during my care of the patient were reviewed by me and considered in my medical decision making (see chart for details).  Clinical Course as of Jul 02 1530  Mon Jul 02, 2018  1011 WBC(!): 15.9 [HK]  1011 Alkaline Phosphatase(!): 149 [HK]  1011 Total Bilirubin(!): 1.4 [HK]  1511 [facility] will consult on patient.   [HK]    Clinical Course User Index [HK] Dietrich PatesKhatri, Dustin Bumbaugh, PA-C    84 year old male presents to ED for dehydration, fatigue and generalized weakness, decreased appetite for the past 2 weeks.  Found to have gallstones right upper quadrant ultrasound done last week.  Scheduled to meet with general surgery tomorrow.  My exam he is tender to palpation in the right upper quadrant epigastric area.  Patient is in A. Fib, which is new for him.  Other vital signs are within normal limits.  He has had a chronic cough for several weeks.  Chest x-ray with no acute findings.  Right upper quadrant ultrasound shows borderline dilatation of CBD at 7.3 mm.  Cholelithiasis noted.  Slight elevation in LFTs compared to last week.  Is unremarkable.  Will order CT of abdomen pelvis, began on Zosyn for cholangitis, will consult general surgery, GI and hospitalist for admission.  He remains rate controlled in A. fib which is most likely as a result of  his dehydration.     Portions of this note were generated with Scientist, clinical (histocompatibility and immunogenetics)Dragon dictation software. Dictation errors may occur despite best attempts at proofreading.   Final Clinical Impressions(s) / ED Diagnoses   Final diagnoses:  RUQ pain  Cholangitis    ED Discharge Orders    None       Dietrich PatesKhatri, Janes Colegrove, PA-C 07/02/18 1533    Shaune PollackIsaacs, Cameron, MD 07/02/18 1742

## 2018-07-02 NOTE — Consult Note (Signed)
[facility] Surgery Consult/Admission Note  Brad Reed 10-17-1934  [id number].    Requesting Provider: Dietrich Pates PA-C Chief Complaint/Reason for Consult: nausea  HPI:   Pt is an 84 yo male with a hx of BPH, diverticulosis, cholelithiasis, hypertension, sleep apnea who presents to ED for 2-week history of generalized weakness, nausea, and decreased appetite. Pt denies having abdominal pain. He was seen by his PCP who referred the pt to see Dr. Dwain Sarna for gallstones. [place] today showed stone without signs of cholecystitis. Pt states he had pizza the night of the super bowel on 02/02 and has had nausea since. We were asked to see for cholelithiasis and possible choledocholithiasis. CT scan showed possible liver abscess. Pt states he had an appendectomy at age 53. No other abdominal surgeries and pt denies blood thinners.   ROS:  Review of Systems  Constitutional: Positive for malaise/fatigue. Negative for chills, diaphoresis and fever.  HENT: Negative for sore throat.   Respiratory: Negative for cough and shortness of breath.   Cardiovascular: Negative for chest pain.  Gastrointestinal: Positive for nausea. Negative for abdominal pain, blood in stool, constipation, diarrhea and vomiting.  Genitourinary: Negative for dysuria.  Skin: Negative for rash.  Neurological: Negative for dizziness and loss of consciousness.  All other systems reviewed and are negative.    Family History  Problem Relation Age of Onset  . Cancer Mother   . Obesity Sister   . Diabetes Sister   . Diabetes Maternal Uncle     Past Medical History:  Diagnosis Date  . Angiodysplasia 02/13/2006  . Arthritis    knees  . Benign prostatic hypertrophy   . Colon polyps   . Diverticulosis of colon   . Hypertension   . Macular degeneration, wet (HCC)    Dr Chyrl Civatte  . Obesity   . Peripheral neuropathy    feet  . Sleep apnea    DX'd when younger. declined CPAP.    Past Surgical History:   Procedure Laterality Date  . APPENDECTOMY    . CATARACT EXTRACTION W/PHACO Left 03/05/2018   Procedure: CATARACT EXTRACTION PHACO AND INTRAOCULAR LENS PLACEMENT (IOC) LEFT;  Surgeon: Nevada Crane, MD;  Location: [facility];  Service: Ophthalmology;  Laterality: Left;  sleep apnea  . CATARACT EXTRACTION W/PHACO Right 03/19/2018   Procedure: CATARACT EXTRACTION PHACO AND INTRAOCULAR LENS PLACEMENT (IOC);  Surgeon: Nevada Crane, MD;  Location: [facility];  Service: Ophthalmology;  Laterality: Right;  . KNEE ARTHROSCOPY     BOTH KNEES  . MASTOID DEBRIDEMENT    . PILONIDAL CYST EXCISION  1960  . TONSILLECTOMY      Social History:  reports that he quit smoking about 35 years ago. His smoking use included cigarettes. He has never used smokeless tobacco. He reports current alcohol use of about 14.0 standard drinks of alcohol per week. He reports that he does not use drugs.  Allergies: No Known Allergies  (Not in a hospital admission)   Blood pressure (!) 144/55, pulse 91, temperature 97.9 F (36.6 C), temperature source Oral, resp. rate 16, SpO2 94 %.  Physical Exam Constitutional:      General: He is not in acute distress.    Appearance: Normal appearance. He is not diaphoretic.  HENT:     Head: Normocephalic and atraumatic.     Nose: Nose normal.     Mouth/Throat:     Lips: Pink.     Mouth: Mucous membranes are moist.     Pharynx: Oropharynx  is clear.  Eyes:     General: No scleral icterus.       Right eye: No discharge.        Left eye: No discharge.     Conjunctiva/sclera: Conjunctivae normal.     Comments: Pupils are equal and round  Neck:     Musculoskeletal: Normal range of motion and neck supple.  Cardiovascular:     Rate and Rhythm: Normal rate. Rhythm irregularly irregular.     Pulses:          Radial pulses are 2+ on the right side and 2+ on the left side.     Heart sounds: Normal heart sounds. No murmur.  Pulmonary:     Effort:  Pulmonary effort is normal. No respiratory distress.     Breath sounds: Normal breath sounds. No wheezing, rhonchi or rales.  Abdominal:     General: Bowel sounds are normal. There is no distension.     Palpations: Abdomen is soft. Abdomen is not rigid.     Tenderness: There is no abdominal tenderness. There is no guarding.  Musculoskeletal: Normal range of motion.        General: No tenderness or deformity.     Right lower leg: Edema present.     Left lower leg: Edema present.  Skin:    General: Skin is warm and dry.     Findings: No rash.  Neurological:     Mental Status: He is alert and oriented to person, place, and time.  Psychiatric:        Mood and Affect: Mood normal.        Behavior: Behavior normal.     Results for orders placed or performed during the hospital encounter of 07/02/18 (from the past 48 hour(s))  Basic metabolic panel     Status: Abnormal   Collection Time: 07/02/18 10:11 AM  Result Value Ref Range   Sodium 137 135 - 145 mmol/L   Potassium 3.4 (L) 3.5 - 5.1 mmol/L   Chloride 100 98 - 111 mmol/L   CO2 28 22 - 32 mmol/L   Glucose, Bld 121 (H) 70 - 99 mg/dL   BUN 10 8 - 23 mg/dL   Creatinine, Ser 1.610.98 0.61 - 1.24 mg/dL   Calcium 8.6 (L) 8.9 - 10.3 mg/dL   GFR calc non Af Amer >60 >60 mL/min   GFR calc Af Amer >60 >60 mL/min   Anion gap 9 5 - 15    Comment: Performed at [facility], [address] [place], [place] [address]  CBC     Status: Abnormal   Collection Time: 07/02/18 10:11 AM  Result Value Ref Range   WBC 15.9 (H) 4.0 - 10.5 K/uL   RBC 4.59 4.22 - 5.81 MIL/uL   Hemoglobin 14.8 13.0 - 17.0 g/dL   HCT 54.046.3 98.139.0 - 19.152.0 %   MCV 100.9 (H) 80.0 - 100.0 fL   MCH 32.2 26.0 - 34.0 pg   MCHC 32.0 30.0 - 36.0 g/dL   RDW 47.812.3 29.511.5 - 62.115.5 %   Platelets 318 150 - 400 K/uL   nRBC 0.0 0.0 - 0.2 %    Comment: Performed at [facility], [address] [place], [place] [address]  Hepatic function panel     Status: Abnormal   Collection  Time: 07/02/18 10:11 AM  Result Value Ref Range   Total Protein 6.8 6.5 - 8.1 g/dL   Albumin 2.3 (L) 3.5 - 5.0 g/dL  AST 45 (H) 15 - 41 U/L   ALT 49 (H) 0 - 44 U/L   Alkaline Phosphatase 149 (H) 38 - 126 U/L   Total Bilirubin 1.4 (H) 0.3 - 1.2 mg/dL   Bilirubin, Direct 0.5 (H) 0.0 - 0.2 mg/dL   Indirect Bilirubin 0.9 0.3 - 0.9 mg/dL    Comment: Performed at [facility], [address] [place], [place] [address]  Lipase, blood     Status: None   Collection Time: 07/02/18 10:11 AM  Result Value Ref Range   Lipase 35 11 - 51 U/L    Comment: Performed at [facility], [address] [place], [place] [address]   Dg Chest 2 View  Result Date: 07/02/2018 CLINICAL DATA:  84 year old male with dehydration EXAM: CHEST - 2 VIEW COMPARISON:  None. FINDINGS: Cardiomediastinal silhouette demonstrates cardiomegaly. Low lung volumes. No pneumothorax. No confluent airspace disease. No pleural effusion. Mildly coarsened interstitial markings. No interlobular septal thickening or evidence of central vascular congestion. No displaced fracture.  Degenerative changes of the spine. IMPRESSION: Low lung volumes with likely chronic changes and no evidence of acute cardiopulmonary disease Electronically Signed   By: Gilmer Mor D.O.   On: 07/02/2018 14:00   Ct Abdomen Pelvis W Contrast  Result Date: 07/02/2018 CLINICAL DATA:  Evaluate dehydration, suspected cholecystitis EXAM: CT ABDOMEN AND PELVIS WITH CONTRAST TECHNIQUE: Multidetector CT imaging of the abdomen and pelvis was performed using the standard protocol following bolus administration of intravenous contrast. CONTRAST:  OMNIPAQUE IOHEXOL 300 MG/ML  SOLN COMPARISON:  Abdominal ultrasound, 07/02/2018 FINDINGS: Lower chest: No acute abnormality. Hepatobiliary: There are large air and fluid containing lesions of the left lobe of the liver, the larger measuring at least 14.7 cm and a smaller adjacent lesion measuring 3.8 cm (series 3, image  14). No gallstones, gallbladder wall thickening, or biliary dilatation. Pancreas: Unremarkable. No pancreatic ductal dilatation or surrounding inflammatory changes. Spleen: Normal in size without focal abnormality. Adrenals/Urinary Tract: Adrenal glands are unremarkable. Kidneys are normal, without renal calculi, focal lesion, or hydronephrosis. Thickening of the urinary bladder, likely due to chronic outlet obstruction. Stomach/Bowel: Stomach is within normal limits. Appendix not clearly visualized. No evidence of bowel wall thickening, distention, or inflammatory changes. Vascular/Lymphatic: No significant vascular findings are present. No enlarged abdominal or pelvic lymph nodes. Reproductive: Prostatomegaly. Other: No abdominal wall hernia or abnormality. No abdominopelvic ascites. Musculoskeletal: No acute or significant osseous findings. IMPRESSION: 1. There are large air and fluid containing lesions of the left lobe of the liver, the larger measuring at least 14.7 cm and a smaller adjacent lesion measuring 3.8 cm (series 3, image 14). It is unclear whether one or both of these lesions communicate to the biliary system. The presence of air is concerning for liver abscess. As on prior ultrasound examinations, contrast enhanced MRI may be useful to further characterize. 2. Gallstones described on prior ultrasound examinations are not well appreciated by CT. Electronically Signed   By: Lauralyn Primes M.D.   On: 07/02/2018 15:58   US Abdomen Limited Ruq  Result Date: 07/02/2018 CLINICAL DATA:  Right upper quadrant pain. EXAM: ULTRASOUND ABDOMEN LIMITED RIGHT UPPER QUADRANT COMPARISON:  Ultrasound June 28, 2018 FINDINGS: Gallbladder: There are multiple stones in the gallbladder with the largest measuring 1.8 cm. The gallbladder is contracted. No wall thickening or Murphy's sign. No pericholecystic fluid. Common bile duct: Diameter: 7.3 mm Liver: There are 2 complicated cystic masses in the liver. The first in  the left  hepatic lobe measures 11 x 9 x 9 cm. The smaller adjacent mass measures 4 x 2.4 x 2.9 cm. Portal vein is patent on color Doppler imaging with normal direction of blood flow towards the liver. IMPRESSION: 1. The gallbladder is contracted limiting evaluation. The gallbladder contains stones with the largest measuring 1.8 cm. No wall thickening, pericholecystic fluid, or Murphy's sign. 2. The common bile duct is borderline measuring 7.3 mm. Recommend correlation with labs. 3. 2 complex cystic masses in the left hepatic lobe with the largest measuring 11 cm. Recommend either an MRI with and without contrast or a contrast-enhanced CT scan for further evaluation. An MRI is favored if the patient is MRI compatible. Electronically Signed   By: Gerome Samavid  Williams III M.D   On: 07/02/2018 14:23      Assessment/Plan Active Problems:   * No active hospital problems. *  Fatigue and nausea  - suspect these symptoms are 2/2 to liver abscess - recommend IR drainage - GI following Cholelithiasis  - based on imaging do not suspect cholecystitis  - do not think he needs lap chole this admission - we will sign off. Please page us with any further needs for this pt.   Thank you for the consult.    Jerre SimonJessica L Erik Nessel, [facility] 07/02/2018, 4:13 PM Pager: [phone] Consults: [phone] Mon-Fri 7:00 am-4:30 pm Sat-Sun 7:00 am-11:30 am

## 2018-07-02 NOTE — H&P (Signed)
History and Physical    Brad Reed ZOX:[id number] DOB: [birth year] DOA: 07/02/2018  PCP: Karie Schwalbe, MD Consultants:  Alferd Patee- eye  Patient coming from:  Home - lives with sister; NOK: French Ana, [phone]  Chief Complaint: Dehyration  HPI: Brad Reed is a 84 y.o. male with medical history significant of OSA not on CPAP; macular degeneration; HTN; BPH; and angiodysplasia in 2007 presenting with dehydration.  His son and his wife and his sister forced him to come in.  He has not been eating.  He had an ultrasound a week ago and it showed a gallbladder problem.  This has been going on for about 3 weeks.  If he looks at food, he gets nauseous - but he does not vomit.  He is having normal BMs "for the little I eat."  No fever.  No abdominal pain.  Unspecified amount of weight loss.  When he wakes up at night, he goes to stand up "it's like [facility]." This has been happening for about 4-5 months and he does not have incontinence any other time.  He is so weak.    ED Course:  No appetite, RUQ pain, sent over for dehydration.  +WBC elevated.  Concern for cholangitis, given Zosyn and IVF.  In new-onset afib with rate control.  GI is consulting.  Surgery is consulting.  CT obtained and showed ?liver abscess.  Review of Systems: As per HPI; otherwise review of systems reviewed and negative.   Ambulatory Status:  Ambulates with a walker because he fell once  Past Medical History:  Diagnosis Date  . Angiodysplasia 02/13/2006  . Arthritis    knees  . Benign prostatic hypertrophy   . Colon polyps   . Diverticulosis of colon   . Hypertension   . Macular degeneration, wet (HCC)    Dr Chyrl Civatte  . Obesity   . Peripheral neuropathy    feet  . Sleep apnea    DX'd when younger. declined CPAP.    Past Surgical History:  Procedure Laterality Date  . APPENDECTOMY    . CATARACT EXTRACTION W/PHACO Left 03/05/2018   Procedure: CATARACT EXTRACTION PHACO AND  INTRAOCULAR LENS PLACEMENT (IOC) LEFT;  Surgeon: Nevada Crane, MD;  Location: [facility];  Service: Ophthalmology;  Laterality: Left;  sleep apnea  . CATARACT EXTRACTION W/PHACO Right 03/19/2018   Procedure: CATARACT EXTRACTION PHACO AND INTRAOCULAR LENS PLACEMENT (IOC);  Surgeon: Nevada Crane, MD;  Location: [facility];  Service: Ophthalmology;  Laterality: Right;  . KNEE ARTHROSCOPY     BOTH KNEES  . MASTOID DEBRIDEMENT    . PILONIDAL CYST EXCISION  1960  . TONSILLECTOMY      Social History   Socioeconomic History  . Marital status: Widowed    Spouse name: Not on file  . Number of children: 3  . Years of education: Not on file  . Highest education level: Not on file  Occupational History  . 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 Financial resource strain: Not on file  . Food insecurity:    Worry: Not on file    Inability: Not on file  . Transportation needs:    Medical: Not on file    Non-medical: Not on file  Tobacco Use  . Smoking status: Former Smoker    Types: Cigarettes    Last attempt to quit: 05/17/1983    Years since quitting: 35.1  .  Smokeless tobacco: Never Used  Substance and Sexual Activity  . Alcohol use: Yes    Alcohol/week: 14.0 standard drinks    Types: 14 Shots of liquor per week    Comment: Drinks 2 cocktails /day  . Drug use: No  . Sexual activity: Not on file  Lifestyle  . Physical activity:    Days per week: Not on file    Minutes per session: Not on file  . Stress: Not on file  Relationships  . Social connections:    Talks on phone: Not on file    Gets together: Not on file    Attends religious service: Not on file    Active member of club or organization: Not on file    Attends meetings of clubs or organizations: Not on file    Relationship status: Not on file  . Intimate partner violence:    Fear of current or ex partner: Not on file    Emotionally  abused: Not on file    Physically abused: Not on file    Forced sexual activity: Not on file  Other Topics Concern  . Not on file  Social History Narrative   Has living will    Son [name] is health care POA   Would accept resuscitation but no prolonged artificial life support   No tube feeds if cognitively unaware    No Known Allergies  Family History  Problem Relation Age of Onset  . Cancer Mother   . Obesity Sister   . Diabetes Sister   . Diabetes Maternal Uncle     Prior to Admission medications   Medication Sig Start Date End Date Taking? Authorizing Provider  Ibuprofen 200 MG CAPS Take 200 mg by mouth as needed (pain).  05/31/10  Yes [provider]  multivitamin-lutein (OCUVITE-LUTEIN) CAPS capsule Take 1 capsule by mouth daily.   Yes [provider]  omeprazole (PRILOSEC) 20 MG capsule Take 1 capsule (20 mg total) by mouth 2 (two) times daily before a meal. 06/25/18  Yes Tillman Abide I, MD  potassium chloride (K-DUR) 10 MEQ tablet TAKE 1 TABLET (10 MEQ TOTAL) BY MOUTH 2 (TWO) TIMES DAILY. Patient taking differently: Take 10 mEq by mouth daily.  09/14/16  Yes Karie Schwalbe, MD  triamterene-hydrochlorothiazide (DYAZIDE) 37.5-25 MG capsule TAKE ONE CAPSULE BY MOUTH EVERY MORNING Patient taking differently: Take 1 capsule by mouth daily.  03/14/18  Yes Karie Schwalbe, MD    Physical Exam: Vitals:   07/02/18 1130 07/02/18 1200 07/02/18 1300 07/02/18 1515  BP: (!) 136/31 135/75 (!) 147/109 (!) 144/55  Pulse: (!) 113 90 (!) 102 91  Resp:  16  16  Temp:      TempSrc:      SpO2: 92% 93% 92% 94%     . General:  Appears calm and comfortable and is NAD . Eyes:   EOMI, normal lids, iris . ENT:  grossly normal hearing, lips & tongue, mmm . Neck:  no LAD, masses or thyromegaly . Cardiovascular:  Irregularly irregular, no m/r/g. No LE edema.  Marland Kitchen Respiratory:   CTA bilaterally with no wheezes/rales/rhonchi.  Normal respiratory effort. . Abdomen:  soft,  NT, ND, NABS . Skin:  no rash or induration seen on limited exam . Musculoskeletal:  grossly normal tone BUE/BLE, good ROM, no bony abnormality . Psychiatric:  grossly normal mood and affect, speech fluent and appropriate, AOx3 . Neurologic:  CN 2-12 grossly intact, moves all extremities in coordinated fashion, sensation intact  Radiological Exams on Admission: Dg Chest 2 View  Result Date: 07/02/2018 CLINICAL DATA:  84 year old male with dehydration EXAM: CHEST - 2 VIEW COMPARISON:  None. FINDINGS: Cardiomediastinal silhouette demonstrates cardiomegaly. Low lung volumes. No pneumothorax. No confluent airspace disease. No pleural effusion. Mildly coarsened interstitial markings. No interlobular septal thickening or evidence of central vascular congestion. No displaced fracture.  Degenerative changes of the spine. IMPRESSION: Low lung volumes with likely chronic changes and no evidence of acute cardiopulmonary disease Electronically Signed   By: Gilmer MorJaime  Wagner D.O.   On: 07/02/2018 14:00   Ct Abdomen Pelvis W Contrast  Result Date: 07/02/2018 CLINICAL DATA:  Evaluate dehydration, suspected cholecystitis EXAM: CT ABDOMEN AND PELVIS WITH CONTRAST TECHNIQUE: Multidetector CT imaging of the abdomen and pelvis was performed using the standard protocol following bolus administration of intravenous contrast. CONTRAST:  100mL OMNIPAQUE IOHEXOL 300 MG/ML  SOLN COMPARISON:  Abdominal ultrasound, 07/02/2018 FINDINGS: Lower chest: No acute abnormality. Hepatobiliary: There are large air and fluid containing lesions of the left lobe of the liver, the larger measuring at least 14.7 cm and a smaller adjacent lesion measuring 3.8 cm (series 3, image 14). No gallstones, gallbladder wall thickening, or biliary dilatation. Pancreas: Unremarkable. No pancreatic ductal dilatation or surrounding inflammatory changes. Spleen: Normal in size without focal abnormality. Adrenals/Urinary Tract: Adrenal glands are unremarkable.  Kidneys are normal, without renal calculi, focal lesion, or hydronephrosis. Thickening of the urinary bladder, likely due to chronic outlet obstruction. Stomach/Bowel: Stomach is within normal limits. Appendix not clearly visualized. No evidence of bowel wall thickening, distention, or inflammatory changes. Vascular/Lymphatic: No significant vascular findings are present. No enlarged abdominal or pelvic lymph nodes. Reproductive: Prostatomegaly. Other: No abdominal wall hernia or abnormality. No abdominopelvic ascites. Musculoskeletal: No acute or significant osseous findings. IMPRESSION: 1. There are large air and fluid containing lesions of the left lobe of the liver, the larger measuring at least 14.7 cm and a smaller adjacent lesion measuring 3.8 cm (series 3, image 14). It is unclear whether one or both of these lesions communicate to the biliary system. The presence of air is concerning for liver abscess. As on prior ultrasound examinations, contrast enhanced MRI may be useful to further characterize. 2. Gallstones described on prior ultrasound examinations are not well appreciated by CT. Electronically Signed   By: Lauralyn PrimesAlex  Bibbey M.D.   On: 07/02/2018 15:58   [place] Abdomen Limited Ruq  Result Date: 07/02/2018 CLINICAL DATA:  Right upper quadrant pain. EXAM: ULTRASOUND ABDOMEN LIMITED RIGHT UPPER QUADRANT COMPARISON:  Ultrasound June 28, 2018 FINDINGS: Gallbladder: There are multiple stones in the gallbladder with the largest measuring 1.8 cm. The gallbladder is contracted. No wall thickening or Murphy's sign. No pericholecystic fluid. Common bile duct: Diameter: 7.3 mm Liver: There are 2 complicated cystic masses in the liver. The first in the left hepatic lobe measures 11 x 9 x 9 cm. The smaller adjacent mass measures 4 x 2.4 x 2.9 cm. Portal vein is patent on color Doppler imaging with normal direction of blood flow towards the liver. IMPRESSION: 1. The gallbladder is contracted limiting evaluation. The  gallbladder contains stones with the largest measuring 1.8 cm. No wall thickening, pericholecystic fluid, or Murphy's sign. 2. The common bile duct is borderline measuring 7.3 mm. Recommend correlation with labs. 3. 2 complex cystic masses in the left hepatic lobe with the largest measuring 11 cm. Recommend either an MRI with and without contrast or a contrast-enhanced CT scan for further evaluation. An MRI is favored if the patient  is MRI compatible. Electronically Signed   By: Gerome Sam III M.D   On: 07/02/2018 14:23    EKG: Independently reviewed.  Afib with RVR with rate 122; nonspecific ST changes with no evidence of acute ischemia   Labs on Admission: I have personally reviewed the available labs and imaging studies at the time of the admission.  Pertinent labs:   K+ 3.4 Glucose 121 AP 149 Albumin 2.3 AST 45/ALT 49/Bili 1.4 WBC 15.9   Assessment/Plan Principal Problem:   Hepatic abscess Active Problems:   Essential hypertension, benign   Obesity, Class III, BMI 40-49.9 (morbid obesity) (HCC)   Impaired fasting glucose   Abnormal LFTs   Hepatic abscess and elevated LFTs -Patient presenting with weeks of anorexia, weakness, and nausea -[place] on 2/13 showed gallstones with 2 complicated liver cystic masses measuring up to 9 cm -Repeat US today showed stones up to 1.8 cm with borderline CBD dilation and 2 complex cystic masses with one now up to 11 cm -LFTs are mildly elevated today -CT today then showed large air and fluid-containing lesions of the left lobe of the liver with the largest 14.7 cm and concerning for liver abscess -GI and surgery have been consulted -While he clearly does have cholelithiasis, there is no current evidence of cholecystitis or cholangitis -He has been started on Zosyn -Will admit to med surg -Will request IR evaluation and management to drain the presumed hepatic abscesses tomorrow; NPO after MN -He may also benefit from perc drainage of his  gallbladder, but will defer to IR/surgery -IV PPI daily for now  HTN -Hold Dyazide -Will cover with prn IV hydralazine for now  Impaired fasting glucose -Check A1c -If glucose continues to uptrend, he may require SSI -Recheck glucose in AM on BMP  Obesity  -Prior weight 139.3 kg -Has not been weighed today, will order in order to track -Recommended outpatient weight loss     DVT prophylaxis:  SCDs Code Status: DNR - confirmed with patient Family Communication: None present Disposition Plan:  Home once clinically improved Consults called: GI; surgery; IR  Admission status: Admit - It is my clinical opinion that admission to INPATIENT is reasonable and necessary because of the expectation that this patient will require hospital care that crosses at least 2 midnights to treat this condition based on the medical complexity of the problems presented.  Given the aforementioned information, the predictability of an adverse outcome is felt to be significant.    Jonah Blue MD [facility]   How to contact the [facility] Attending or Consulting provider 7A - 7P or covering provider during after hours 7P -7A, for this patient?  1. Check the care team in [facility] and look for a) attending/consulting [facility] provider listed and b) the [facility] team listed 2. Log into [email or web address] and use [facility] universal password to access. If you do not have the password, please contact the hospital operator. 3. Locate the [facility] provider you are looking for under [facility] and page to a number that you can be directly reached. 4. If you still have difficulty reaching the provider, please page the [facility] (Director on Call) for the Hospitalists listed on amion for assistance.   07/02/2018, 5:35 PM

## 2018-07-02 NOTE — ED Notes (Signed)
On portable cardiac monitor

## 2018-07-03 ENCOUNTER — Encounter (HOSPITAL_COMMUNITY): Payer: Self-pay | Admitting: Interventional Radiology

## 2018-07-03 ENCOUNTER — Inpatient Hospital Stay (HOSPITAL_COMMUNITY): Payer: Medicare PPO

## 2018-07-03 DIAGNOSIS — L8990 Pressure ulcer of unspecified site, unspecified stage: Secondary | ICD-10-CM

## 2018-07-03 DIAGNOSIS — R7301 Impaired fasting glucose: Secondary | ICD-10-CM

## 2018-07-03 HISTORY — PX: IR GUIDED DRAIN W CATHETER PLACEMENT: IMG719

## 2018-07-03 LAB — BASIC METABOLIC PANEL
Anion gap: 7 (ref 5–15)
BUN: 10 mg/dL (ref 8–23)
CO2: 33 mmol/L — ABNORMAL HIGH (ref 22–32)
Calcium: 8.3 mg/dL — ABNORMAL LOW (ref 8.9–10.3)
Chloride: 98 mmol/L (ref 98–111)
Creatinine, Ser: 1.07 mg/dL (ref 0.61–1.24)
GFR calc Af Amer: >60 mL/min (ref >60)
GFR calc non Af Amer: >60 mL/min (ref >60)
Glucose, Bld: 117 mg/dL — ABNORMAL HIGH (ref 70–99)
POTASSIUM: 3.1 mmol/L — AB (ref 3.5–5.1)
Sodium: 138 mmol/L (ref 135–145)

## 2018-07-03 LAB — HEPATIC FUNCTION PANEL
ALT: 43 U/L (ref 0–44)
AST: 40 U/L (ref 15–41)
Albumin: 2 g/dL — ABNORMAL LOW (ref 3.5–5.0)
Alkaline Phosphatase: 119 U/L (ref 38–126)
BILIRUBIN TOTAL: 1.2 mg/dL (ref 0.3–1.2)
Bilirubin, Direct: 0.5 mg/dL — ABNORMAL HIGH (ref 0.0–0.2)
Indirect Bilirubin: 0.7 mg/dL (ref 0.3–0.9)
Total Protein: 5.7 g/dL — ABNORMAL LOW (ref 6.5–8.1)

## 2018-07-03 LAB — CBC
HCT: 42.1 % (ref 39.0–52.0)
Hemoglobin: 13.4 g/dL (ref 13.0–17.0)
MCH: 32.2 pg (ref 26.0–34.0)
MCHC: 31.8 g/dL (ref 30.0–36.0)
MCV: 101.2 fL — ABNORMAL HIGH (ref 80.0–100.0)
Platelets: 276 10*3/uL (ref 150–400)
RBC: 4.16 MIL/uL — ABNORMAL LOW (ref 4.22–5.81)
RDW: 12.2 % (ref 11.5–15.5)
WBC: 14 10*3/uL — ABNORMAL HIGH (ref 4.0–10.5)
nRBC: 0 % (ref 0.0–0.2)

## 2018-07-03 MED ORDER — FENTANYL CITRATE (PF) 100 MCG/2ML IJ SOLN
INTRAMUSCULAR | Status: AC | PRN
  Administered 2018-07-03 (×2): 50 ug via INTRAVENOUS

## 2018-07-03 MED ORDER — MIDAZOLAM HCL 2 MG/2ML IJ SOLN
INTRAMUSCULAR | Status: AC
  Administered 2018-07-03: 15:00:00
  Filled 2018-07-03: qty 2

## 2018-07-03 MED ORDER — SODIUM CHLORIDE 0.9 % IV BOLUS
500.0000 mL | Freq: Once | INTRAVENOUS | Status: AC
  Administered 2018-07-03: 500 mL via INTRAVENOUS

## 2018-07-03 MED ORDER — HEPARIN SODIUM (PORCINE) 5000 UNIT/ML IJ SOLN
5000.0000 [IU] | Freq: Three times a day (TID) | INTRAMUSCULAR | Status: DC
  Administered 2018-07-04 – 2018-07-08 (×12): 5000 [IU] via SUBCUTANEOUS
  Filled 2018-07-03 (×13): qty 1

## 2018-07-03 MED ORDER — SODIUM CHLORIDE 0.9% FLUSH
5.0000 mL | Freq: Three times a day (TID) | INTRAVENOUS | Status: DC
  Administered 2018-07-03 – 2018-07-07 (×13): 5 mL

## 2018-07-03 MED ORDER — LIDOCAINE HCL 1 % IJ SOLN
INTRAMUSCULAR | Status: AC
  Administered 2018-07-03: 15:00:00
  Filled 2018-07-03: qty 20

## 2018-07-03 MED ORDER — POTASSIUM CHLORIDE 10 MEQ/100ML IV SOLN
10.0000 meq | INTRAVENOUS | Status: AC
  Administered 2018-07-03 (×4): 10 meq via INTRAVENOUS
  Filled 2018-07-03: qty 100

## 2018-07-03 MED ORDER — LIDOCAINE HCL (PF) 1 % IJ SOLN
INTRAMUSCULAR | Status: AC | PRN
  Administered 2018-07-03: 10 mL

## 2018-07-03 MED ORDER — SODIUM CHLORIDE 0.9 % IV SOLN
INTRAVENOUS | Status: AC | PRN
  Administered 2018-07-03: 10 mL/h via INTRAVENOUS

## 2018-07-03 MED ORDER — POTASSIUM CHLORIDE CRYS ER 20 MEQ PO TBCR
40.0000 meq | EXTENDED_RELEASE_TABLET | Freq: Once | ORAL | Status: AC
  Administered 2018-07-03: 40 meq via ORAL
  Filled 2018-07-03: qty 2

## 2018-07-03 MED ORDER — FENTANYL CITRATE (PF) 100 MCG/2ML IJ SOLN
INTRAMUSCULAR | Status: AC
  Filled 2018-07-03: qty 2

## 2018-07-03 MED ORDER — MIDAZOLAM HCL 2 MG/2ML IJ SOLN
INTRAMUSCULAR | Status: AC | PRN
  Administered 2018-07-03 (×2): 1 mg via INTRAVENOUS

## 2018-07-03 NOTE — Progress Notes (Signed)
Per Dr. Randol Kern, ok to stop LR infusion

## 2018-07-03 NOTE — Progress Notes (Signed)
PROGRESS NOTE                                                                                                                                                                                                             Patient Demographics:    Brad Reed, is a 84 y.o. male, DOB - [birth year], PQA:[id number]  Admit date - 07/02/2018   Admitting Physician Jonah Blue, MD  Outpatient Primary MD for the patient is Karie Schwalbe, MD  LOS - 1   Chief Complaint  Patient presents with  . Dehydration       Brief Narrative     84 y.o. male with medical history significant of OSA not on CPAP; macular degeneration; HTN; BPH; and angiodysplasia in 2007 presenting with dehydration,   +WBC elevated.    CT chest showing evidence of liver abscess, he was admitted for further evaluation, seen by general surgery for cholelithiasis, at this point no recommendation for lap chole, being followed by GI, status post IR drainage of his liver abscess 07/03/2018    Subjective:    Brad Reed today ports some chills, he is afebrile, denies any abdominal pain .    Assessment  & Plan :    Principal Problem:   Hepatic abscess Active Problems:   Essential hypertension, benign   Obesity, Class III, BMI 40-49.9 (morbid obesity) (HCC)   Impaired fasting glucose   Abnormal LFTs   Pressure injury of skin   Hepatic abscess and elevated LFTs -CT abdomen significant for 2 complex cystic masses in the left liver lobe, largest measuring  11 cm. -Status post image by IR today, with 660 cc purulent material drained, will await official reading to see of both or 1 of the cysts has been drained, but will follow on Gram stain, cultures and adjust antibiotics as needed -Continue with IV Zosyn - GI input greatly appreciated  Cholelithiasis -General surgery input greatly appreciated, imaging with no suspicion for cholecystitis, urology would not recommend lap chole at this  time, and can follow with general surgery as an outpatient.  HTN -Pressures overall acceptable, continue to hold Dyazide -New with PRN hydralazine  Impaired fasting glucose -Check A1c -So far glucose has been acceptable on BMPs  Obesity  -Prior weight 139.3 kg -Has not been weighed today, will order in order to track -Recommended outpatient  weight loss       Code Status : DNR  Family Communication  : None at bedside  Disposition Plan  : home when stable  Barriers For Discharge : On IV antibiotics, awaiting further work-up results for his liver abscess  Consults  : Gastroenterology, general surgery, IR  Procedures  : IR drainage for liver abscess  DVT Prophylaxis  :  SCD, will start subcu heparin tomorrow  Lab Results  Component Value Date   PLT 276 07/03/2018    Antibiotics  :    Anti-infectives (From admission, onward)   Start     Dose/Rate Route Frequency Ordered Stop   07/02/18 2300  piperacillin-tazobactam (ZOSYN) IVPB 3.375 g     3.375 g 12.5 mL/hr over 240 Minutes Intravenous Every 8 hours 07/02/18 1507     07/02/18 1515  piperacillin-tazobactam (ZOSYN) IVPB 3.375 g     3.375 g 100 mL/hr over 30 Minutes Intravenous  Once 07/02/18 1501 07/02/18 1658        Objective:   Vitals:   07/03/18 1355 07/03/18 1400 07/03/18 1405 07/03/18 1441  BP: 129/70 120/72 121/70 122/61  Pulse: 89 82 85 78  Resp: (!) 22   14  Temp:    98.4 F (36.9 C)  TempSrc:    Oral  SpO2: 97% 94% 90% 93%  Weight:      Height:        Wt Readings from Last 3 Encounters:  07/02/18 135.9 kg  06/25/18 (!) 139.3 kg  03/19/18 (!) 140.6 kg     Intake/Output Summary (Last 24 hours) at 07/03/2018 1455 Last data filed at 07/02/2018 1658 Gross per 24 hour  Intake 1174.75 ml  Output -  Net 1174.75 ml     Physical Exam  Awake Alert, Oriented X 3, No new F.N deficits, Normal affect Symmetrical Chest wall movement, Good air movement bilaterally, CTAB RRR,No Gallops,Rubs  or new Murmurs, No Parasternal Heave +ve B.Sounds, Abd Soft, No tenderness, and had one drain coming from mid abdominal area, with purulent secretion in the bag .no rebound - guarding or rigidity. No Cyanosis, Clubbing or edema, No new Rash or bruise      Data Review:    CBC Recent Labs  Lab 07/02/18 1011 07/03/18 0326  WBC 15.9* 14.0*  HGB 14.8 13.4  HCT 46.3 42.1  PLT 318 276  MCV 100.9* 101.2*  MCH 32.2 32.2  MCHC 32.0 31.8  RDW 12.3 12.2    Chemistries  Recent Labs  Lab 07/02/18 1011 07/03/18 0326  NA 137 138  K 3.4* 3.1*  CL 100 98  CO2 28 33*  GLUCOSE 121* 117*  BUN 10 10  CREATININE 0.98 1.07  CALCIUM 8.6* 8.3*  AST 45* 40  ALT 49* 43  ALKPHOS 149* 119  BILITOT 1.4* 1.2   ------------------------------------------------------------------------------------------------------------------ No results for input(s): CHOL, HDL, LDLCALC, TRIG, CHOLHDL, LDLDIRECT in the last 72 hours.  Lab Results  Component Value Date   HGBA1C 5.7 08/26/2014   ------------------------------------------------------------------------------------------------------------------ No results for input(s): TSH, T4TOTAL, T3FREE, THYROIDAB in the last 72 hours.  Invalid input(s): FREET3 ------------------------------------------------------------------------------------------------------------------ No results for input(s): VITAMINB12, FOLATE, FERRITIN, TIBC, IRON, RETICCTPCT in the last 72 hours.  Coagulation profile No results for input(s): INR, PROTIME in the last 168 hours.  No results for input(s): DDIMER in the last 72 hours.  Cardiac Enzymes No results for input(s): CKMB, TROPONINI, MYOGLOBIN in the last 168 hours.  Invalid input(s): CK ------------------------------------------------------------------------------------------------------------------ No results found for: BNP  Inpatient Medications  Scheduled Meds: . feeding supplement  1 Container Oral TID BM  .  fentaNYL      . lidocaine      . midazolam      . multivitamin  1 tablet Oral Daily  . pantoprazole (PROTONIX) IV  40 mg Intravenous Q24H  . sodium chloride flush  5 mL Intracatheter Q8H   Continuous Infusions: . lactated ringers 100 mL/hr at 07/02/18 1737  . piperacillin-tazobactam (ZOSYN)  IV 3.375 g (07/03/18 0530)   PRN Meds:.acetaminophen **OR** acetaminophen, hydrALAZINE, morphine injection, ondansetron **OR** ondansetron (ZOFRAN) IV  Micro Results No results found for this or any previous visit (from the past 240 hour(s)).  Radiology Reports Dg Chest 2 View  Result Date: 07/02/2018 CLINICAL DATA:  84 year old male with dehydration EXAM: CHEST - 2 VIEW COMPARISON:  None. FINDINGS: Cardiomediastinal silhouette demonstrates cardiomegaly. Low lung volumes. No pneumothorax. No confluent airspace disease. No pleural effusion. Mildly coarsened interstitial markings. No interlobular septal thickening or evidence of central vascular congestion. No displaced fracture.  Degenerative changes of the spine. IMPRESSION: Low lung volumes with likely chronic changes and no evidence of acute cardiopulmonary disease Electronically Signed   By: Gilmer Mor D.O.   On: 07/02/2018 14:00   Ct Abdomen Pelvis W Contrast  Result Date: 07/02/2018 CLINICAL DATA:  Evaluate dehydration, suspected cholecystitis EXAM: CT ABDOMEN AND PELVIS WITH CONTRAST TECHNIQUE: Multidetector CT imaging of the abdomen and pelvis was performed using the standard protocol following bolus administration of intravenous contrast. CONTRAST:  OMNIPAQUE IOHEXOL 300 MG/ML  SOLN COMPARISON:  Abdominal ultrasound, 07/02/2018 FINDINGS: Lower chest: No acute abnormality. Hepatobiliary: There are large air and fluid containing lesions of the left lobe of the liver, the larger measuring at least 14.7 cm and a smaller adjacent lesion measuring 3.8 cm (series 3, image 14). No gallstones, gallbladder wall thickening, or biliary dilatation.  Pancreas: Unremarkable. No pancreatic ductal dilatation or surrounding inflammatory changes. Spleen: Normal in size without focal abnormality. Adrenals/Urinary Tract: Adrenal glands are unremarkable. Kidneys are normal, without renal calculi, focal lesion, or hydronephrosis. Thickening of the urinary bladder, likely due to chronic outlet obstruction. Stomach/Bowel: Stomach is within normal limits. Appendix not clearly visualized. No evidence of bowel wall thickening, distention, or inflammatory changes. Vascular/Lymphatic: No significant vascular findings are present. No enlarged abdominal or pelvic lymph nodes. Reproductive: Prostatomegaly. Other: No abdominal wall hernia or abnormality. No abdominopelvic ascites. Musculoskeletal: No acute or significant osseous findings. IMPRESSION: 1. There are large air and fluid containing lesions of the left lobe of the liver, the larger measuring at least 14.7 cm and a smaller adjacent lesion measuring 3.8 cm (series 3, image 14). It is unclear whether one or both of these lesions communicate to the biliary system. The presence of air is concerning for liver abscess. As on prior ultrasound examinations, contrast enhanced MRI may be useful to further characterize. 2. Gallstones described on prior ultrasound examinations are not well appreciated by CT. Electronically Signed   By: Lauralyn Primes M.D.   On: 07/02/2018 15:58   US Abdomen Limited Ruq  Result Date: 07/02/2018 CLINICAL DATA:  Right upper quadrant pain. EXAM: ULTRASOUND ABDOMEN LIMITED RIGHT UPPER QUADRANT COMPARISON:  Ultrasound June 28, 2018 FINDINGS: Gallbladder: There are multiple stones in the gallbladder with the largest measuring 1.8 cm. The gallbladder is contracted. No wall thickening or Murphy's sign. No pericholecystic fluid. Common bile duct: Diameter: 7.3 mm Liver: There are 2 complicated cystic masses in the liver. The first in the left hepatic lobe measures 11  x 9 x 9 cm. The smaller adjacent mass  measures 4 x 2.4 x 2.9 cm. Portal vein is patent on color Doppler imaging with normal direction of blood flow towards the liver. IMPRESSION: 1. The gallbladder is contracted limiting evaluation. The gallbladder contains stones with the largest measuring 1.8 cm. No wall thickening, pericholecystic fluid, or Murphy's sign. 2. The common bile duct is borderline measuring 7.3 mm. Recommend correlation with labs. 3. 2 complex cystic masses in the left hepatic lobe with the largest measuring 11 cm. Recommend either an MRI with and without contrast or a contrast-enhanced CT scan for further evaluation. An MRI is favored if the patient is MRI compatible. Electronically Signed   By: Gerome Sam III M.D   On: 07/02/2018 14:23   US Abdomen Limited Ruq  Result Date: 06/28/2018 CLINICAL DATA:  Right upper quadrant pain. Symptoms for several weeks. EXAM: ULTRASOUND ABDOMEN LIMITED RIGHT UPPER QUADRANT COMPARISON:  None. FINDINGS: Gallbladder: Gallbladder is contracted. There are shadowing stones limiting assessment of the gallbladder. Largest stone measures 1.5 cm. Gallbladder wall is borderline to mildly thickened measuring 3.7 mm. No pericholecystic fluid. Common bile duct: Diameter: 6 mm Liver: Mildly increased parenchymal echogenicity. Somewhat coarsened echotexture. 9 x 8 x 9 cm complex cyst in the left lobe. Adjacent septated cyst measuring 4 x 3 x 2 cm portal vein is patent on color Doppler imaging with normal direction of blood flow towards the liver. IMPRESSION: 1. No acute findings. 2. Gallstones with significant posterior shadowing limiting assessment of the gallbladder. Gallbladder assessment also limited by lack of distension. Wall measures borderline to mildly thickened, this is felt be due to lack of distension. 3. Mild increased liver parenchymal echogenicity suggesting hepatic steatosis. 4. Two complicated liver cysts in the left lobe, largest measuring 9 cm. Recommend further assessment of these cystic  masses with either liver MRI, with and without contrast, or contrast enhanced CT. Electronically Signed   By: Amie Portland M.D.   On: 06/28/2018 15:44    Huey Bienenstock M.D on 07/03/2018 at 2:55 PM  Between 7am to 7pm - Pager - [phone]  After 7pm go to [email or web address] - password [facility]  [facility] -  Office  [phone]

## 2018-07-03 NOTE — Procedures (Signed)
PERIHEPATIC LIVER ABSCESS  S/P [place] ABSCESS DRAIN  660CC PUS ASPIRATED! NO COMP STABLE EBL MIN CX SENT FULL REPORT IN PACS

## 2018-07-03 NOTE — Consult Note (Signed)
Chief Complaint: Patient was seen in consultation today for liver abscess aspiration/possible drain placement.   Referring Physician(s): Willette Cluster, NP  Supervising Physician: Ruel Favors  Patient Status: [facility] - In-pt  History of Present Illness: Brad Reed is a 84 y.o. male with a past medical history significant for arthritis, BPH, macular degeneration, cholelithiasis, diverticulosis and HTN who presented to [facility] ED on 07/02/18 due generalized weakness, nausea, abdominal pain, diarrhea and decreased appetite x 2 weeks. He was seen by his PCP last week and RUQ abdominal US was performed which showed cholelithiasis and thickened gallbladder wall, he was scheduled to meet with general surgery as on outpatient today for possible cholecystectomy however he states he felt so bad that he came to the hospital instead. RUQ abdominal US was again performed in ED which again showed cholelithiasis however no wall thickening, pericholecystic fluid or Murphy's sign was noted; the CBD measure 7.3 mm; two complex cystic masses in the left hepatic lobe with the largest measuring 11 cm. A CT abdomen/pelvis with contrast was then obtained for further evaluation which showed large air and fluid containing lesions of the left lobe of the liver, the larger measuring at least 14.7 cm and a smaller adjacent lesion measuring 3.8 cm; concerning for liver abscess. Request has been made to IR for aspiration and drainage of these liver lesions.   Patient reports he feels better after some IV fluids and medicines. He states he was unaware of the requested procedure but states "whatever it takes to make me feel better is fine with me." He states he was also not aware of NPO status and drank an entire bottle of Propel water just prior to my arrival to his room at about 10 am.   Past Medical History:  Diagnosis Date  . Angiodysplasia 02/13/2006  . Arthritis    knees  . Benign prostatic hypertrophy   . Colon polyps    . Diverticulosis of colon   . Hypertension   . Macular degeneration, wet (HCC)    Dr Chyrl Civatte  . Obesity   . Peripheral neuropathy    feet  . Sleep apnea    DX'd when younger. declined CPAP.    Past Surgical History:  Procedure Laterality Date  . APPENDECTOMY    . CATARACT EXTRACTION W/PHACO Left 03/05/2018   Procedure: CATARACT EXTRACTION PHACO AND INTRAOCULAR LENS PLACEMENT (IOC) LEFT;  Surgeon: Nevada Crane, MD;  Location: [facility];  Service: Ophthalmology;  Laterality: Left;  sleep apnea  . CATARACT EXTRACTION W/PHACO Right 03/19/2018   Procedure: CATARACT EXTRACTION PHACO AND INTRAOCULAR LENS PLACEMENT (IOC);  Surgeon: Nevada Crane, MD;  Location: [facility];  Service: Ophthalmology;  Laterality: Right;  . KNEE ARTHROSCOPY     BOTH KNEES  . MASTOID DEBRIDEMENT    . PILONIDAL CYST EXCISION  1960  . TONSILLECTOMY      Allergies: Patient has no known allergies.  Medications: Prior to Admission medications   Medication Sig Start Date End Date Taking? Authorizing Provider  Ibuprofen 200 MG CAPS Take 200 mg by mouth as needed (pain).  05/31/10  Yes [provider]  multivitamin-lutein (OCUVITE-LUTEIN) CAPS capsule Take 1 capsule by mouth daily.   Yes [provider]  omeprazole (PRILOSEC) 20 MG capsule Take 1 capsule (20 mg total) by mouth 2 (two) times daily before a meal. 06/25/18  Yes Tillman Abide I, MD  potassium chloride (K-DUR) 10 MEQ tablet TAKE 1 TABLET (10 MEQ TOTAL) BY MOUTH 2 (TWO)  TIMES DAILY. Patient taking differently: Take 10 mEq by mouth daily.  09/14/16  Yes Karie Schwalbe, MD  triamterene-hydrochlorothiazide (DYAZIDE) 37.5-25 MG capsule TAKE ONE CAPSULE BY MOUTH EVERY MORNING Patient taking differently: Take 1 capsule by mouth daily.  03/14/18  Yes Karie Schwalbe, MD     Family History  Problem Relation Age of Onset  . Cancer Mother   . Obesity Sister   . Diabetes Sister   . Diabetes  Maternal Uncle     Social History   Socioeconomic History  . Marital status: Widowed    Spouse name: Not on file  . Number of children: 3  . Years of education: Not on file  . Highest education level: Not on file  Occupational History  . 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 Financial resource strain: Not on file  . Food insecurity:    Worry: Not on file    Inability: Not on file  . Transportation needs:    Medical: Not on file    Non-medical: Not on file  Tobacco Use  . Smoking status: Former Smoker    Types: Cigarettes    Last attempt to quit: 05/17/1983    Years since quitting: 35.1  . Smokeless tobacco: Never Used  Substance and Sexual Activity  . Alcohol use: Yes    Alcohol/week: 14.0 standard drinks    Types: 14 Shots of liquor per week    Comment: Drinks 2 cocktails /day  . Drug use: No  . Sexual activity: Not on file  Lifestyle  . Physical activity:    Days per week: Not on file    Minutes per session: Not on file  . Stress: Not on file  Relationships  . Social connections:    Talks on phone: Not on file    Gets together: Not on file    Attends religious service: Not on file    Active member of club or organization: Not on file    Attends meetings of clubs or organizations: Not on file    Relationship status: Not on file  Other Topics Concern  . Not on file  Social History Narrative   Has living will    Son [name] is health care POA   Would accept resuscitation but no prolonged artificial life support   No tube feeds if cognitively unaware     Review of Systems: A 12 point ROS discussed and pertinent positives are indicated in the HPI above.  All other systems are negative.  Review of Systems  Constitutional: Positive for appetite change (decreased) and fatigue. Negative for chills and fever.  Respiratory: Negative for cough and shortness of breath.   Cardiovascular: Negative for chest  pain.  Gastrointestinal: Negative for abdominal pain (none currently - has intermittent abd pain), blood in stool, diarrhea, nausea and vomiting.  Neurological: Negative for headaches.  Psychiatric/Behavioral: Negative for confusion.    Vital Signs: BP 139/66 (BP Location: Right Arm)   Pulse 94   Temp 98.3 F (36.8 C) (Oral)   Resp 16   Ht 5\' 8"  (1.727 m)   Wt 299 lb 9.7 oz (135.9 kg)   SpO2 96%   BMI 45.55 kg/m   Physical Exam Vitals signs and nursing note reviewed.  Constitutional:      General: He is not in acute distress.    Appearance: He is obese.  HENT:     Head: Normocephalic.  Cardiovascular:  Rate and Rhythm: Normal rate and regular rhythm.  Pulmonary:     Effort: Pulmonary effort is normal.     Breath sounds: Normal breath sounds.  Abdominal:     General: There is no distension.     Palpations: Abdomen is soft.     Tenderness: There is no abdominal tenderness.  Skin:    General: Skin is warm and dry.     Coloration: Skin is not jaundiced.  Neurological:     Mental Status: He is alert and oriented to person, place, and time.  Psychiatric:        Mood and Affect: Mood normal.        Behavior: Behavior normal.        Thought Content: Thought content normal.        Judgment: Judgment normal.      MD Evaluation Airway: WNL Heart: WNL Abdomen: WNL Chest/ Lungs: WNL ASA  Classification: 3 Mallampati/Airway Score: Two   Imaging: Dg Chest 2 View  Result Date: 07/02/2018 CLINICAL DATA:  84 year old male with dehydration EXAM: CHEST - 2 VIEW COMPARISON:  None. FINDINGS: Cardiomediastinal silhouette demonstrates cardiomegaly. Low lung volumes. No pneumothorax. No confluent airspace disease. No pleural effusion. Mildly coarsened interstitial markings. No interlobular septal thickening or evidence of central vascular congestion. No displaced fracture.  Degenerative changes of the spine. IMPRESSION: Low lung volumes with likely chronic changes and no  evidence of acute cardiopulmonary disease Electronically Signed   By: Gilmer MorJaime  Wagner D.O.   On: 07/02/2018 14:00   Ct Abdomen Pelvis W Contrast  Result Date: 07/02/2018 CLINICAL DATA:  Evaluate dehydration, suspected cholecystitis EXAM: CT ABDOMEN AND PELVIS WITH CONTRAST TECHNIQUE: Multidetector CT imaging of the abdomen and pelvis was performed using the standard protocol following bolus administration of intravenous contrast. CONTRAST:  100mL OMNIPAQUE IOHEXOL 300 MG/ML  SOLN COMPARISON:  Abdominal ultrasound, 07/02/2018 FINDINGS: Lower chest: No acute abnormality. Hepatobiliary: There are large air and fluid containing lesions of the left lobe of the liver, the larger measuring at least 14.7 cm and a smaller adjacent lesion measuring 3.8 cm (series 3, image 14). No gallstones, gallbladder wall thickening, or biliary dilatation. Pancreas: Unremarkable. No pancreatic ductal dilatation or surrounding inflammatory changes. Spleen: Normal in size without focal abnormality. Adrenals/Urinary Tract: Adrenal glands are unremarkable. Kidneys are normal, without renal calculi, focal lesion, or hydronephrosis. Thickening of the urinary bladder, likely due to chronic outlet obstruction. Stomach/Bowel: Stomach is within normal limits. Appendix not clearly visualized. No evidence of bowel wall thickening, distention, or inflammatory changes. Vascular/Lymphatic: No significant vascular findings are present. No enlarged abdominal or pelvic lymph nodes. Reproductive: Prostatomegaly. Other: No abdominal wall hernia or abnormality. No abdominopelvic ascites. Musculoskeletal: No acute or significant osseous findings. IMPRESSION: 1. There are large air and fluid containing lesions of the left lobe of the liver, the larger measuring at least 14.7 cm and a smaller adjacent lesion measuring 3.8 cm (series 3, image 14). It is unclear whether one or both of these lesions communicate to the biliary system. The presence of air is  concerning for liver abscess. As on prior ultrasound examinations, contrast enhanced MRI may be useful to further characterize. 2. Gallstones described on prior ultrasound examinations are not well appreciated by CT. Electronically Signed   By: Lauralyn PrimesAlex  Bibbey M.D.   On: 07/02/2018 15:58   [place] Abdomen Limited Ruq  Result Date: 07/02/2018 CLINICAL DATA:  Right upper quadrant pain. EXAM: ULTRASOUND ABDOMEN LIMITED RIGHT UPPER QUADRANT COMPARISON:  Ultrasound June 28, 2018 FINDINGS: Gallbladder:  There are multiple stones in the gallbladder with the largest measuring 1.8 cm. The gallbladder is contracted. No wall thickening or Murphy's sign. No pericholecystic fluid. Common bile duct: Diameter: 7.3 mm Liver: There are 2 complicated cystic masses in the liver. The first in the left hepatic lobe measures 11 x 9 x 9 cm. The smaller adjacent mass measures 4 x 2.4 x 2.9 cm. Portal vein is patent on color Doppler imaging with normal direction of blood flow towards the liver. IMPRESSION: 1. The gallbladder is contracted limiting evaluation. The gallbladder contains stones with the largest measuring 1.8 cm. No wall thickening, pericholecystic fluid, or Murphy's sign. 2. The common bile duct is borderline measuring 7.3 mm. Recommend correlation with labs. 3. 2 complex cystic masses in the left hepatic lobe with the largest measuring 11 cm. Recommend either an MRI with and without contrast or a contrast-enhanced CT scan for further evaluation. An MRI is favored if the patient is MRI compatible. Electronically Signed   By: Gerome Sam III M.D   On: 07/02/2018 14:23   US Abdomen Limited Ruq  Result Date: 06/28/2018 CLINICAL DATA:  Right upper quadrant pain. Symptoms for several weeks. EXAM: ULTRASOUND ABDOMEN LIMITED RIGHT UPPER QUADRANT COMPARISON:  None. FINDINGS: Gallbladder: Gallbladder is contracted. There are shadowing stones limiting assessment of the gallbladder. Largest stone measures 1.5 cm. Gallbladder wall  is borderline to mildly thickened measuring 3.7 mm. No pericholecystic fluid. Common bile duct: Diameter: 6 mm Liver: Mildly increased parenchymal echogenicity. Somewhat coarsened echotexture. 9 x 8 x 9 cm complex cyst in the left lobe. Adjacent septated cyst measuring 4 x 3 x 2 cm portal vein is patent on color Doppler imaging with normal direction of blood flow towards the liver. IMPRESSION: 1. No acute findings. 2. Gallstones with significant posterior shadowing limiting assessment of the gallbladder. Gallbladder assessment also limited by lack of distension. Wall measures borderline to mildly thickened, this is felt be due to lack of distension. 3. Mild increased liver parenchymal echogenicity suggesting hepatic steatosis. 4. Two complicated liver cysts in the left lobe, largest measuring 9 cm. Recommend further assessment of these cystic masses with either liver MRI, with and without contrast, or contrast enhanced CT. Electronically Signed   By: Amie Portland M.D.   On: 06/28/2018 15:44    Labs:  CBC: Recent Labs    09/15/17 1204 06/25/18 1108 07/02/18 1011 07/03/18 0326  WBC 7.7 15.8* 15.9* 14.0*  HGB 17.5* 15.8 14.8 13.4  HCT 51.0 47.8 46.3 42.1  PLT 190.0 309.0 318 276    COAGS: No results for input(s): INR, APTT in the last 8760 hours.  BMP: Recent Labs    09/15/17 1204 06/25/18 1108 07/02/18 1011 07/03/18 0326  NA 140 141 137 138  K 4.2 4.0 3.4* 3.1*  CL 99 101 100 98  CO2 30 29 28  33*  GLUCOSE 105* 103* 121* 117*  BUN 10 15 10 10   CALCIUM 9.8 9.3 8.6* 8.3*  CREATININE 0.99 0.99 0.98 1.07  GFRNONAA  --   --  >60 >60  GFRAA  --   --  >60 >60    LIVER FUNCTION TESTS: Recent Labs    09/15/17 1204 06/25/18 1108 07/02/18 1011 07/03/18 0326  BILITOT 1.1 1.2 1.4* 1.2  AST 16 46* 45* 40  ALT 15 44 49* 43  ALKPHOS 58 122* 149* 119  PROT 6.9 6.9 6.8 5.7*  ALBUMIN 3.8 3.0* 2.3* 2.0*    TUMOR MARKERS: No results for input(s): AFPTM, CEA, CA199,  CHROMGRNA in the  last 8760 hours.  Assessment and Plan:  Patient with two week history of weakness, poor PO intake, nausea and abdominal pain. Noted to have cholelithiasis on RUQ abdominal US performed at PCP last week, scheduled for outpatient general surgery consult today however he presented to [facility] ED yesterday due to above complaints. General surgery was consulted while patient was in the ED - cholecystectomy was not recommended. CT abd/pelvis showed fluid collections in liver consistent with abscess - request has been made to IR for aspiration/possible drain placement of these fluid collections. Patient reviewed with Dr. Miles Costain who approves procedure.  Patient reported that he was not aware of NPO status and had drank one water bottle around 10 am - no solid food since yesterday. He acknowledges NPO during my visit and states he will not eat or drink anything until after the procedure. Plan for aspiration and drainage this afternoon in IR.  Afebrile, WBC 14.0, hgb 13.4, plt 276, creatinine 1.07.   Risks and benefits discussed with the patient including bleeding, infection, damage to adjacent structures, bowel perforation/fistula connection, and sepsis.  All of the patient's questions were answered, patient is agreeable to proceed.  Consent signed and in chart.  Thank you for this interesting consult.  I greatly enjoyed meeting Brad Reed and look forward to participating in their care.  A copy of this report was sent to the requesting provider on this date.  Electronically Signed: Villa Herb, PA-C 07/03/2018, 10:03 AM   I spent a total of 40 Minutes in face to face in clinical consultation, greater than 50% of which was counseling/coordinating care for liver abscess aspiration/possible drain placement.

## 2018-07-04 ENCOUNTER — Telehealth: Payer: Self-pay | Admitting: Internal Medicine

## 2018-07-04 DIAGNOSIS — I10 Essential (primary) hypertension: Secondary | ICD-10-CM

## 2018-07-04 DIAGNOSIS — R945 Abnormal results of liver function studies: Secondary | ICD-10-CM

## 2018-07-04 LAB — CBC
HCT: 40.5 % (ref 39.0–52.0)
Hemoglobin: 12.6 g/dL — ABNORMAL LOW (ref 13.0–17.0)
MCH: 32 pg (ref 26.0–34.0)
MCHC: 31.1 g/dL (ref 30.0–36.0)
MCV: 102.8 fL — ABNORMAL HIGH (ref 80.0–100.0)
Platelets: 299 10*3/uL (ref 150–400)
RBC: 3.94 MIL/uL — ABNORMAL LOW (ref 4.22–5.81)
RDW: 12.3 % (ref 11.5–15.5)
WBC: 15.3 10*3/uL — ABNORMAL HIGH (ref 4.0–10.5)
nRBC: 0 % (ref 0.0–0.2)

## 2018-07-04 LAB — COMPREHENSIVE METABOLIC PANEL
ALT: 47 U/L — ABNORMAL HIGH (ref 0–44)
ANION GAP: 10 (ref 5–15)
AST: 50 U/L — ABNORMAL HIGH (ref 15–41)
Albumin: 1.9 g/dL — ABNORMAL LOW (ref 3.5–5.0)
Alkaline Phosphatase: 121 U/L (ref 38–126)
BUN: 12 mg/dL (ref 8–23)
CO2: 29 mmol/L (ref 22–32)
Calcium: 8.3 mg/dL — ABNORMAL LOW (ref 8.9–10.3)
Chloride: 100 mmol/L (ref 98–111)
Creatinine, Ser: 1.37 mg/dL — ABNORMAL HIGH (ref 0.61–1.24)
GFR calc non Af Amer: 48 mL/min — ABNORMAL LOW (ref >60)
GFR, EST AFRICAN AMERICAN: 56 mL/min — AB (ref >60)
Glucose, Bld: 119 mg/dL — ABNORMAL HIGH (ref 70–99)
Potassium: 3.5 mmol/L (ref 3.5–5.1)
Sodium: 139 mmol/L (ref 135–145)
Total Bilirubin: 1.5 mg/dL — ABNORMAL HIGH (ref 0.3–1.2)
Total Protein: 5.8 g/dL — ABNORMAL LOW (ref 6.5–8.1)

## 2018-07-04 LAB — C DIFFICILE QUICK SCREEN W PCR REFLEX
C DIFFICILE (CDIFF) INTERP: NOT DETECTED
C Diff antigen: NEGATIVE
C Diff toxin: NEGATIVE

## 2018-07-04 LAB — HEMOGLOBIN A1C
Hgb A1c MFr Bld: 5.2 % (ref 4.8–5.6)
Mean Plasma Glucose: 103 mg/dL

## 2018-07-04 MED ORDER — HYDRALAZINE HCL 25 MG PO TABS: 25.0000 mg | ORAL_TABLET | Freq: Four times a day (QID) | ORAL | Status: DC | PRN

## 2018-07-04 MED ORDER — SACCHAROMYCES BOULARDII 250 MG PO CAPS
250.0000 mg | ORAL_CAPSULE | Freq: Two times a day (BID) | ORAL | Status: DC
  Administered 2018-07-04 – 2018-07-08 (×9): 250 mg via ORAL
  Filled 2018-07-04 (×9): qty 1

## 2018-07-04 MED ORDER — SODIUM CHLORIDE 0.9 % IV BOLUS
500.0000 mL | Freq: Once | INTRAVENOUS | Status: AC
  Administered 2018-07-04: 500 mL via INTRAVENOUS

## 2018-07-04 MED ORDER — RISAQUAD PO CAPS
2.0000 | ORAL_CAPSULE | Freq: Every day | ORAL | Status: DC
  Administered 2018-07-04 – 2018-07-08 (×5): 2 via ORAL
  Filled 2018-07-04 (×5): qty 2

## 2018-07-04 MED ORDER — LOPERAMIDE HCL 2 MG PO CAPS
2.0000 mg | ORAL_CAPSULE | Freq: Three times a day (TID) | ORAL | Status: DC | PRN
  Administered 2018-07-04: 2 mg via ORAL
  Filled 2018-07-04: qty 1

## 2018-07-04 MED ORDER — PANTOPRAZOLE SODIUM 40 MG PO TBEC
40.0000 mg | DELAYED_RELEASE_TABLET | Freq: Every day | ORAL | Status: DC
  Administered 2018-07-05 – 2018-07-08 (×4): 40 mg via ORAL
  Filled 2018-07-04 (×4): qty 1

## 2018-07-04 NOTE — Progress Notes (Signed)
[facility]  PROGRESS NOTE  Brad Reed UVQ:[id number] DOB: 03/14/1935 DOA: 07/02/2018 PCP: Karie Schwalbe, MD   Brief HPI:   84 year old male with history of obstructive sleep apnea not on CPAP, macular degeneration, hypertension, BPH, angiodysplasia in 2007 came with dehydration, generalized weakness.  Patient also has poor p.o. intake for past 3 weeks.  Complains of nausea but no vomiting.  In the ED initially was started on IV Zosyn for possible cholangitis for right upper quadrant pain.  CT abdomen showed liver abscess.  Patient underwent drain placement for the liver abscess.    Subjective   Patient seen and examined, denies abdominal pain.  Complains of diarrhea.   Assessment/Plan:     1. Liver abscess-status post aspiration and drain placement. Culture from the abscess is growing Streptococcus angiosis.  Continue IV Zosyn.  Final culture and sensitivity is pending.  2. Diarrhea-C. difficile PCR obtained this morning is negative.  Will start Imodium as needed.  3. Hypertension-Blood pressure is stable,Dyazide is currently on hold.  Start hydralazine PRN  4. Impaired fasting glucose-hemoglobin A1c is 5.2.    CBG: No results for input(s): GLUCAP in the last 168 hours.  CBC: Recent Labs  Lab 07/02/18 1011 07/03/18 0326 07/04/18 0314  WBC 15.9* 14.0* 15.3*  HGB 14.8 13.4 12.6*  HCT 46.3 42.1 40.5  MCV 100.9* 101.2* 102.8*  PLT 318 276 299    Basic Metabolic Panel: Recent Labs  Lab 07/02/18 1011 07/03/18 0326 07/04/18 0314  NA 137 138 139  K 3.4* 3.1* 3.5  CL 100 98 100  CO2 28 33* 29  GLUCOSE 121* 117* 119*  BUN 10 10 12   CREATININE 0.98 1.07 1.37*  CALCIUM 8.6* 8.3* 8.3*     DVT prophylaxis: SCds  Code Status: DNR  Family Communication: No family at bedside  Disposition Plan: likely home when medically ready for discharge     Consultants:  GI  Procedures:  Drain placement for liver abscess per IR   Antibiotics:    Anti-infectives (From admission, onward)   Start     Dose/Rate Route Frequency Ordered Stop   07/02/18 2300  piperacillin-tazobactam (ZOSYN) IVPB 3.375 g     3.375 g 12.5 mL/hr over 240 Minutes Intravenous Every 8 hours 07/02/18 1507     07/02/18 1515  piperacillin-tazobactam (ZOSYN) IVPB 3.375 g     3.375 g 100 mL/hr over 30 Minutes Intravenous  Once 07/02/18 1501 07/02/18 1658       Objective   Vitals:   07/03/18 2255 07/04/18 0235 07/04/18 0240 07/04/18 0819  BP: (!) 105/57 (!) 100/49  108/71  Pulse: (!) 125 90  93  Resp: (!) 25 (!) 24    Temp: 98.9 F (37.2 C)   (!) 97.4 F (36.3 C)  TempSrc: Oral   Oral  SpO2: 90% (!) 89% 95% 94%  Weight:      Height:        Intake/Output Summary (Last 24 hours) at 07/04/2018 1430 Last data filed at 07/04/2018 1427 Gross per 24 hour  Intake 3387.04 ml  Output 245 ml  Net 3142.04 ml   Filed Weights   07/02/18 2029  Weight: 135.9 kg     Physical Examination:    General: Appears in no acute distress  Cardiovascular: S1-S2, regular, no murmur auscultated  Respiratory: Clear to auscultation bilaterally  Abdomen: Abdomen is soft, nontender, drain in place  Extremities: No edema of the lower extremities  Neurologic: Cranial nerve II through XII grossly intact, motor  strength 5/5 in all extremities     Data Reviewed: I have personally reviewed following labs and imaging studies   Recent Results (from the past 240 hour(s))  Aerobic/Anaerobic Culture (surgical/deep wound)     Status: None (Preliminary result)   Collection Time: 07/03/18  2:35 PM  Result Value Ref Range Status   Specimen Description ABSCESS LIVER  Final   Special Requests Normal  Final   Gram Stain   Final    ABUNDANT WBC PRESENT,BOTH PMN AND MONONUCLEAR ABUNDANT GRAM POSITIVE COCCI IN PAIRS AND CHAINS FEW GRAM NEGATIVE RODS FEW GRAM VARIABLE ROD Performed at [facility], [address], [place], [place] [address]    Culture ABUNDANT  STREPTOCOCCUS ANGINOSIS  Final   Report Status PENDING  Incomplete  C difficile quick scan w PCR reflex     Status: None   Collection Time: 07/04/18  8:50 AM  Result Value Ref Range Status   C Diff antigen NEGATIVE NEGATIVE Final   C Diff toxin NEGATIVE NEGATIVE Final   C Diff interpretation No C. difficile detected.  Final    Comment: Performed at [facility], [address], [place], [place] [address]     Liver Function Tests: Recent Labs  Lab 07/02/18 1011 07/03/18 0326 07/04/18 0314  AST 45* 40 50*  ALT 49* 43 47*  ALKPHOS 149* 119 121  BILITOT 1.4* 1.2 1.5*  PROT 6.8 5.7* 5.8*  ALBUMIN 2.3* 2.0* 1.9*   Recent Labs  Lab 07/02/18 1011  LIPASE 35   No results for input(s): AMMONIA in the last 168 hours.  Cardiac Enzymes: No results for input(s): CKTOTAL, CKMB, CKMBINDEX, TROPONINI in the last 168 hours. BNP (last 3 results) No results for input(s): BNP in the last 8760 hours.  ProBNP (last 3 results) No results for input(s): PROBNP in the last 8760 hours.    Studies: Ct Abdomen Pelvis W Contrast  Result Date: 07/02/2018 CLINICAL DATA:  Evaluate dehydration, suspected cholecystitis EXAM: CT ABDOMEN AND PELVIS WITH CONTRAST TECHNIQUE: Multidetector CT imaging of the abdomen and pelvis was performed using the standard protocol following bolus administration of intravenous contrast. CONTRAST:  OMNIPAQUE IOHEXOL 300 MG/ML  SOLN COMPARISON:  Abdominal ultrasound, 07/02/2018 FINDINGS: Lower chest: No acute abnormality. Hepatobiliary: There are large air and fluid containing lesions of the left lobe of the liver, the larger measuring at least 14.7 cm and a smaller adjacent lesion measuring 3.8 cm (series 3, image 14). No gallstones, gallbladder wall thickening, or biliary dilatation. Pancreas: Unremarkable. No pancreatic ductal dilatation or surrounding inflammatory changes. Spleen: Normal in size without focal abnormality. Adrenals/Urinary Tract: Adrenal glands are  unremarkable. Kidneys are normal, without renal calculi, focal lesion, or hydronephrosis. Thickening of the urinary bladder, likely due to chronic outlet obstruction. Stomach/Bowel: Stomach is within normal limits. Appendix not clearly visualized. No evidence of bowel wall thickening, distention, or inflammatory changes. Vascular/Lymphatic: No significant vascular findings are present. No enlarged abdominal or pelvic lymph nodes. Reproductive: Prostatomegaly. Other: No abdominal wall hernia or abnormality. No abdominopelvic ascites. Musculoskeletal: No acute or significant osseous findings. IMPRESSION: 1. There are large air and fluid containing lesions of the left lobe of the liver, the larger measuring at least 14.7 cm and a smaller adjacent lesion measuring 3.8 cm (series 3, image 14). It is unclear whether one or both of these lesions communicate to the biliary system. The presence of air is concerning for liver abscess. As on prior ultrasound examinations, contrast enhanced MRI may be useful  to further characterize. 2. Gallstones described on prior ultrasound examinations are not well appreciated by CT. Electronically Signed   By: Lauralyn PrimesAlex  Bibbey M.D.   On: 07/02/2018 15:58   Ir Guided Horace Porteousrain W Catheter Placement  Result Date: 07/03/2018 INDICATION: Midline subcapsular hepatic abscess EXAM: Ultrasound subcapsular hepatic abscess drain MEDICATIONS: The patient is currently admitted to the hospital and receiving intravenous antibiotics. The antibiotics were administered within an appropriate time frame prior to the initiation of the procedure. ANESTHESIA/SEDATION: Fentanyl 100 mcg IV; Versed 2.0 mg IV Moderate Sedation Time:  18 minutes The patient was continuously monitored during the procedure by the interventional radiology nurse under my direct supervision. COMPLICATIONS: None immediate. PROCEDURE: Informed written consent was obtained from the patient after a thorough discussion of the procedural risks,  benefits and alternatives. All questions were addressed. Maximal Sterile Barrier Technique was utilized including caps, mask, sterile gowns, sterile gloves, sterile drape, hand hygiene and skin antiseptic. A timeout was performed prior to the initiation of the procedure. Previous imaging reviewed. Preliminary ultrasound performed. The subhepatic abscess was localized from a subxiphoid window in the midline. A transhepatic approach to the left hepatic lobe was localized and marked. Under sterile conditions and local anesthesia, an 18 gauge 15 cm access needle was advanced transhepatic Lea under direct ultrasound into the abscess. Needle position confirmed with ultrasound. Syringe aspiration yielded purulent fluid. Sample sent for culture. Amplatz guidewire inserted followed by tract dilatation to insert a 12 [place] drain. Drain catheter position confirmed with ultrasound. Syringe aspiration yielded 660 cc total purulent fluid. This collapsed abscess cavity. Catheter secured with Prolene suture and connected to external suction bulb. No immediate complication. Patient tolerated the procedure well. IMPRESSION: Successful ultrasound left hepatic subcapsular abscess drain insertion. Electronically Signed   By: Judie PetitM.  Shick M.D.   On: 07/03/2018 16:12    Scheduled Meds: . acidophilus  2 capsule Oral Daily  . feeding supplement  1 Container Oral TID BM  . heparin injection (subcutaneous)  5,000 Units Subcutaneous Q8H  . multivitamin  1 tablet Oral Daily  . [START ON 07/05/2018] pantoprazole  40 mg Oral Q0600  . saccharomyces boulardii  250 mg Oral BID  . sodium chloride flush  5 mL Intracatheter Q8H    Admission status: Inpatient: Based on patients clinical presentation and evaluation of above clinical data, I have made determination that patient meets Inpatient criteria at this time.  Time spent: 20 min  Meredeth IdeGagan S Kissa Campoy   [facility] Pager [phone]. If 7PM-7AM, please contact night-coverage at  [email or web address], Office  [phone]  password [email or web address]  07/04/2018, 2:30 PM  LOS: 2 days

## 2018-07-04 NOTE — Consult Note (Signed)
            [facility] CM Primary Care Navigator  07/04/2018  Brad Reed [birth year] [id number]   Seen patient at the bedsideto identify possible discharge needs.  Patient reports that he presented to the hospital with dehydration, generalized weakness, poor p.o. intake, complains of nausea but no vomiting and was found to have liver abscess per CT of abdomen and underwent drain placement for the liver abscess.  (hepatic abscess- status post aspiration and drain placement, obesity, essential HTN)  Patientendorses Dr.Richard Alphonsus Reed with [employer] at [facility] as his primary care provider.   [facility] [place] [facility] [place] [facility].   Patient [facility]managinghismedications [facility] use of "pill box" system filled once a week.  Patientverbalizedthat he has been driving prior to admission but sister Brad Reed) will be able to provide transportation to his doctors' appointments after discharge.  According to patient, he lives with sister who will serve as his primary caregiver at [facility] needed.  Anticipated discharge plan ishome per patient.  Patient expressedunderstanding to call primary care provider's officewhen discharge home to schedulea post discharge follow-up appointment within 1- 2 weeksor sooner if needs arise.Patient letter (with PCP's contact number) was provided as areminder.  Explained to patientregardingTHN CM services available for health managementand resourcesat home buthe denies having any needs or concernsat this pointexcept for starting to monitor blood pressure regularly and record resultsto bring to provider on his follow-up visit. Patient mentioned that he knows the things to do to manage health needs (diet, staying active, taking medications and following-up with providers, etc.) but he just needs to do it. He states that his son (Dr. Algernon Reed) can assist  him in managing health issues or needs so far.   Patient encouraged to seekreferral to [facility] care managementfrom primary care provider ifdeemed necessary andappropriatefor services in thenearfuture.  [facility] care management information provided for future needs thathe may have.  Patienthowever,verbally agreedand optedfor EMMI calls to follow-up withhisrecovery at home.   Referral made for [facility] calls after discharge.  Primary care provider's office is listed as providing transition of care (TOC) follow-up.    For additional questions please contact:  Karin Golden A. Lateya Dauria, BSN, RN-BC [facility] PRIMARY CARE Navigator Cell: [phone]

## 2018-07-04 NOTE — Progress Notes (Addendum)
Daily Rounding Note  07/04/2018, 1:17 PM  LOS: 2 days   SUBJECTIVE:   Chief complaint: Liver abscesses.  Drainage from the liver abscess 115 ml thus far.   Appetite is a little bit better.  He is continuing to have increased volume of liquid, brown, nonbloody stools which started after his admission, after starting antibiotics.  C. difficile is negative.  Flexi-Seal in place.  He removed this last night and it was replaced this morning.  Has not had, does not have abdominal pain.  Is complaining of discomfort in his "backside"  OBJECTIVE:         Vital signs in last 24 hours:    Temp:  [97.4 F (36.3 C)-98.9 F (37.2 C)] 97.4 F (36.3 C) (02/19 0819) Pulse Rate:  [78-125] 93 (02/19 0819) Resp:  [14-25] 24 (02/19 0235) BP: (100-158)/(49-81) 108/71 (02/19 0819) SpO2:  [89 %-97 %] 94 % (02/19 0819) Last BM Date: 07/04/18 Filed Weights   07/02/18 2029  Weight: 135.9 kg   General: Obese.  Looks somewhat chronically unwell. Heart: RRR. Chest: Clear bilaterally.  No labored breathing or cough. Abdomen: Obese.  Soft.  Nontender.  Active bowel sounds.  Abscess drain position in RUQ.  Digit material is yellow, Milky, sedimentious.  Liquid medium brown stool present in Flexi-Seal tubing.  No blood. Extremities: Obese but no edema. Neuro/Psych: Oriented x3.  Fully alert.  Intake/Output from previous day: 02/18 0701 - 02/19 0700 In: 3387 [P.O.:840; I.V.:1000; IV Piggyback:1532] Out: 245 [Urine:130; Drains:115]  Intake/Output this shift: No intake/output data recorded.  Lab Results: Recent Labs    07/02/18 1011 07/03/18 0326 07/04/18 0314  WBC 15.9* 14.0* 15.3*  HGB 14.8 13.4 12.6*  HCT 46.3 42.1 40.5  PLT 318 276 299   BMET Recent Labs    07/02/18 1011 07/03/18 0326 07/04/18 0314  NA 137 138 139  K 3.4* 3.1* 3.5  CL 100 98 100  CO2 28 33* 29  GLUCOSE 121* 117* 119*  BUN _0 CREATININE 0.98 1.07  1.37*  CALCIUM 8.6* 8.3* 8.3*   LFT Recent Labs    07/02/18 1011 07/03/18 0326 07/04/18 0314  PROT 6.8 5.7* 5.8*  ALBUMIN 2.3* 2.0* 1.9*  AST 45* 40 50*  ALT 49* 43 47*  ALKPHOS 149* 119 121  BILITOT 1.4* 1.2 1.5*  BILIDIR 0.5* 0.5*  --   IBILI 0.9 0.7  --     Studies/Results: Dg Chest 2 View  Result Date: 07/02/2018 CLINICAL DATA:  84 year old male with dehydration EXAM: CHEST - 2 VIEW COMPARISON:  None. FINDINGS: Cardiomediastinal silhouette demonstrates cardiomegaly. Low lung volumes. No pneumothorax. No confluent airspace disease. No pleural effusion. Mildly coarsened interstitial markings. No interlobular septal thickening or evidence of central vascular congestion. No displaced fracture.  Degenerative changes of the spine. IMPRESSION: Low lung volumes with likely chronic changes and no evidence of acute cardiopulmonary disease Electronically Signed   By: Corrie Mckusick D.O.   On: 07/02/2018 14:00   Ct Abdomen Pelvis W Contrast  Result Date: 07/02/2018 CLINICAL DATA:  Evaluate dehydration, suspected cholecystitis EXAM: CT ABDOMEN AND PELVIS WITH CONTRAST TECHNIQUE: Multidetector CT imaging of the abdomen and pelvis was performed using the standard protocol following bolus administration of intravenous contrast. CONTRAST:  151m OMNIPAQUE IOHEXOL 300 MG/ML  SOLN COMPARISON:  Abdominal ultrasound, 07/02/2018 FINDINGS: Lower chest: No acute abnormality. Hepatobiliary: There are large air and fluid containing lesions of the left lobe of the liver, the  larger measuring at least 14.7 cm and a smaller adjacent lesion measuring 3.8 cm (series 3, image 14). No gallstones, gallbladder wall thickening, or biliary dilatation. Pancreas: Unremarkable. No pancreatic ductal dilatation or surrounding inflammatory changes. Spleen: Normal in size without focal abnormality. Adrenals/Urinary Tract: Adrenal glands are unremarkable. Kidneys are normal, without renal calculi, focal lesion, or hydronephrosis.  Thickening of the urinary bladder, likely due to chronic outlet obstruction. Stomach/Bowel: Stomach is within normal limits. Appendix not clearly visualized. No evidence of bowel wall thickening, distention, or inflammatory changes. Vascular/Lymphatic: No significant vascular findings are present. No enlarged abdominal or pelvic lymph nodes. Reproductive: Prostatomegaly. Other: No abdominal wall hernia or abnormality. No abdominopelvic ascites. Musculoskeletal: No acute or significant osseous findings. IMPRESSION: 1. There are large air and fluid containing lesions of the left lobe of the liver, the larger measuring at least 14.7 cm and a smaller adjacent lesion measuring 3.8 cm (series 3, image 14). It is unclear whether one or both of these lesions communicate to the biliary system. The presence of air is concerning for liver abscess. As on prior ultrasound examinations, contrast enhanced MRI may be useful to further characterize. 2. Gallstones described on prior ultrasound examinations are not well appreciated by CT. Electronically Signed   By: Eddie Candle M.D.   On: 07/02/2018 15:58   Ir Guided Niel Hummer W Catheter Placement  Result Date: 07/03/2018 INDICATION: Midline subcapsular hepatic abscess EXAM: Ultrasound subcapsular hepatic abscess drain MEDICATIONS: The patient is currently admitted to the hospital and receiving intravenous antibiotics. The antibiotics were administered within an appropriate time frame prior to the initiation of the procedure. ANESTHESIA/SEDATION: Fentanyl 100 mcg IV; Versed 2.0 mg IV Moderate Sedation Time:  18 minutes The patient was continuously monitored during the procedure by the interventional radiology nurse under my direct supervision. COMPLICATIONS: None immediate. PROCEDURE: Informed written consent was obtained from the patient after a thorough discussion of the procedural risks, benefits and alternatives. All questions were addressed. Maximal Sterile Barrier Technique was  utilized including caps, mask, sterile gowns, sterile gloves, sterile drape, hand hygiene and skin antiseptic. A timeout was performed prior to the initiation of the procedure. Previous imaging reviewed. Preliminary ultrasound performed. The subhepatic abscess was localized from a subxiphoid window in the midline. A transhepatic approach to the left hepatic lobe was localized and marked. Under sterile conditions and local anesthesia, an 18 gauge 15 cm access needle was advanced transhepatic Lea under direct ultrasound into the abscess. Needle position confirmed with ultrasound. Syringe aspiration yielded purulent fluid. Sample sent for culture. Amplatz guidewire inserted followed by tract dilatation to insert a 12 [place] drain. Drain catheter position confirmed with ultrasound. Syringe aspiration yielded 660 cc total purulent fluid. This collapsed abscess cavity. Catheter secured with Prolene suture and connected to external suction bulb. No immediate complication. Patient tolerated the procedure well. IMPRESSION: Successful ultrasound left hepatic subcapsular abscess drain insertion. Electronically Signed   By: Jerilynn Mages.  Shick M.D.   On: 07/03/2018 16:12   US Abdomen Limited Ruq  Result Date: 07/02/2018 CLINICAL DATA:  Right upper quadrant pain. EXAM: ULTRASOUND ABDOMEN LIMITED RIGHT UPPER QUADRANT COMPARISON:  Ultrasound June 28, 2018 FINDINGS: Gallbladder: There are multiple stones in the gallbladder with the largest measuring 1.8 cm. The gallbladder is contracted. No wall thickening or Murphy's sign. No pericholecystic fluid. Common bile duct: Diameter: 7.3 mm Liver: There are 2 complicated cystic masses in the liver. The first in the left hepatic lobe measures 11 x 9 x 9 cm. The smaller adjacent mass  measures 4 x 2.4 x 2.9 cm. Portal vein is patent on color Doppler imaging with normal direction of blood flow towards the liver. IMPRESSION: 1. The gallbladder is contracted limiting evaluation. The gallbladder  contains stones with the largest measuring 1.8 cm. No wall thickening, pericholecystic fluid, or Murphy's sign. 2. The common bile duct is borderline measuring 7.3 mm. Recommend correlation with labs. 3. 2 complex cystic masses in the left hepatic lobe with the largest measuring 11 cm. Recommend either an MRI with and without contrast or a contrast-enhanced CT scan for further evaluation. An MRI is favored if the patient is MRI compatible. Electronically Signed   By: Dorise Bullion III M.D   On: 07/02/2018 14:23   Scheduled Meds: . feeding supplement  1 Container Oral TID BM  . heparin injection (subcutaneous)  5,000 Units Subcutaneous Q8H  . multivitamin  1 tablet Oral Daily  . pantoprazole (PROTONIX) IV  40 mg Intravenous Q24H  . sodium chloride flush  5 mL Intracatheter Q8H   Continuous Infusions: . lactated ringers Stopped (07/03/18 1500)  . piperacillin-tazobactam (ZOSYN)  IV 12.5 mL/hr at 07/04/18 0659   PRN Meds:.acetaminophen **OR** acetaminophen, hydrALAZINE, morphine injection, ondansetron **OR** ondansetron (ZOFRAN) IV   ASSESMENT:   *  Liver abscesses.  Source ? S/p catheter drainage 2/18 of dominant abscess.  Fluid stain shows +++ WBCs, GPC pairs and chains, few GNRs, few gram variable rods. Cultures pending.  Day 3 Zosyn.   ALK phos improved, transaminases stable, slight increase T bili.  WBCs elevated and essentially stable.    *   Abx associated diarrhea, C diff negative.    *   AKI.    *   Adenomatous colon polyps 2007, no response to call back letter sent 2012.    *   Macrocytosis, chronic.  B 12 level low in 09/2017, normal 10/2017.    *   H Pylori Ab + 06/25/18.  No hx ulcers so not clear it needs treating.     PLAN   *  Continue Zosyn.  Fine tune abx as micro data emerges.    *   Add probiotic: Sachromysis and acidophilus.  Add imodium? Will d/w Dr Fuller Plan.    *   Can PT help with mobility, at the least he could sit in bedside chair.    *   Switch to oral  Protonix, takes low-dose omeprazole at home.   Brad Reed  07/04/2018, 1:17 PM Phone [phone]    Attending Physician Note   I have taken an interval history, reviewed the chart and examined the patient. I agree with the Advanced Practitioner's note, impression and recommendations.   Hepatic abscess S/P IR drainage. IV Zosyn Diarrhea, likely antibiotic related, C diff negative   Consult ID for treatment recommendations Imodium and probiotic for diarrhea  ID and IR to guide mgmt  GI available if needed  Lucio Edward, MD [facility] [phone]

## 2018-07-04 NOTE — Progress Notes (Signed)
Referring Physician(s): Willette Cluster, NP  Supervising Physician: Irish Lack  Patient Status:  [facility] - In-pt  Chief Complaint: Follow up perihepatic liver abscess aspiration and drain placement on 2/18 by Dr. Miles Costain  Subjective:  Patient states he feels bad overall, when asked to further elaborate he states "I just feel like a piece of meat, you know? Like I just have had so much done to me and they keep needing more stuff. I feel like they picked me out of the butcher shop." He denies any abdominal pain, nausea or vomiting. RN at bedside, recently collected stool sample for C.Diff. Patient reports that he does not think that much has been coming out of the drain. Also worried that he will have to stay here because of the drain.   Allergies: Patient has no known allergies.  Medications: Prior to Admission medications   Medication Sig Start Date End Date Taking? Authorizing Provider  Ibuprofen 200 MG CAPS Take 200 mg by mouth as needed (pain).  05/31/10  Yes [provider]  multivitamin-lutein (OCUVITE-LUTEIN) CAPS capsule Take 1 capsule by mouth daily.   Yes [provider]  omeprazole (PRILOSEC) 20 MG capsule Take 1 capsule (20 mg total) by mouth 2 (two) times daily before a meal. 06/25/18  Yes Tillman Abide I, MD  potassium chloride (K-DUR) 10 MEQ tablet TAKE 1 TABLET (10 MEQ TOTAL) BY MOUTH 2 (TWO) TIMES DAILY. Patient taking differently: Take 10 mEq by mouth daily.  09/14/16  Yes Karie Schwalbe, MD  triamterene-hydrochlorothiazide (DYAZIDE) 37.5-25 MG capsule TAKE ONE CAPSULE BY MOUTH EVERY MORNING Patient taking differently: Take 1 capsule by mouth daily.  03/14/18  Yes Karie Schwalbe, MD     Vital Signs: BP 108/71   Pulse 93   Temp (!) 97.4 F (36.3 C) (Oral)   Resp (!) 24   Ht 5\' 8"  (1.727 m)   Wt 299 lb 9.7 oz (135.9 kg)   SpO2 94%   BMI 45.55 kg/m   Physical Exam Vitals signs and nursing note reviewed.  Constitutional:    Appearance: He is obese.  HENT:     Head: Normocephalic.  Cardiovascular:     Rate and Rhythm: Normal rate.  Pulmonary:     Effort: Pulmonary effort is normal.  Abdominal:     Palpations: Abdomen is soft.     Tenderness: There is no abdominal tenderness.     Comments: (+) RUQ drain to JP about 1/2 full with cloudy serous output. Insertion site clean, dry, dressed appropriately without erythema, edema, drainage or active bleeding.    Skin:    General: Skin is warm and dry.  Neurological:     Mental Status: He is alert. Mental status is at baseline.     Imaging: Dg Chest 2 View  Result Date: 07/02/2018 CLINICAL DATA:  84 year old male with dehydration EXAM: CHEST - 2 VIEW COMPARISON:  None. FINDINGS: Cardiomediastinal silhouette demonstrates cardiomegaly. Low lung volumes. No pneumothorax. No confluent airspace disease. No pleural effusion. Mildly coarsened interstitial markings. No interlobular septal thickening or evidence of central vascular congestion. No displaced fracture.  Degenerative changes of the spine. IMPRESSION: Low lung volumes with likely chronic changes and no evidence of acute cardiopulmonary disease Electronically Signed   By: Gilmer Mor D.O.   On: 07/02/2018 14:00   Ct Abdomen Pelvis W Contrast  Result Date: 07/02/2018 CLINICAL DATA:  Evaluate dehydration, suspected cholecystitis EXAM: CT ABDOMEN AND PELVIS WITH CONTRAST TECHNIQUE: Multidetector CT imaging of the abdomen and  pelvis was performed using the standard protocol following bolus administration of intravenous contrast. CONTRAST:  OMNIPAQUE IOHEXOL 300 MG/ML  SOLN COMPARISON:  Abdominal ultrasound, 07/02/2018 FINDINGS: Lower chest: No acute abnormality. Hepatobiliary: There are large air and fluid containing lesions of the left lobe of the liver, the larger measuring at least 14.7 cm and a smaller adjacent lesion measuring 3.8 cm (series 3, image 14). No gallstones, gallbladder wall thickening, or biliary  dilatation. Pancreas: Unremarkable. No pancreatic ductal dilatation or surrounding inflammatory changes. Spleen: Normal in size without focal abnormality. Adrenals/Urinary Tract: Adrenal glands are unremarkable. Kidneys are normal, without renal calculi, focal lesion, or hydronephrosis. Thickening of the urinary bladder, likely due to chronic outlet obstruction. Stomach/Bowel: Stomach is within normal limits. Appendix not clearly visualized. No evidence of bowel wall thickening, distention, or inflammatory changes. Vascular/Lymphatic: No significant vascular findings are present. No enlarged abdominal or pelvic lymph nodes. Reproductive: Prostatomegaly. Other: No abdominal wall hernia or abnormality. No abdominopelvic ascites. Musculoskeletal: No acute or significant osseous findings. IMPRESSION: 1. There are large air and fluid containing lesions of the left lobe of the liver, the larger measuring at least 14.7 cm and a smaller adjacent lesion measuring 3.8 cm (series 3, image 14). It is unclear whether one or both of these lesions communicate to the biliary system. The presence of air is concerning for liver abscess. As on prior ultrasound examinations, contrast enhanced MRI may be useful to further characterize. 2. Gallstones described on prior ultrasound examinations are not well appreciated by CT. Electronically Signed   By: Lauralyn Primes M.D.   On: 07/02/2018 15:58   Ir Guided Horace Porteous W Catheter Placement  Result Date: 07/03/2018 INDICATION: Midline subcapsular hepatic abscess EXAM: Ultrasound subcapsular hepatic abscess drain MEDICATIONS: The patient is currently admitted to the hospital and receiving intravenous antibiotics. The antibiotics were administered within an appropriate time frame prior to the initiation of the procedure. ANESTHESIA/SEDATION: Fentanyl 100 mcg IV; Versed 2.0 mg IV Moderate Sedation Time:  18 minutes The patient was continuously monitored during the procedure by the interventional  radiology nurse under my direct supervision. COMPLICATIONS: None immediate. PROCEDURE: Informed written consent was obtained from the patient after a thorough discussion of the procedural risks, benefits and alternatives. All questions were addressed. Maximal Sterile Barrier Technique was utilized including caps, mask, sterile gowns, sterile gloves, sterile drape, hand hygiene and skin antiseptic. A timeout was performed prior to the initiation of the procedure. Previous imaging reviewed. Preliminary ultrasound performed. The subhepatic abscess was localized from a subxiphoid window in the midline. A transhepatic approach to the left hepatic lobe was localized and marked. Under sterile conditions and local anesthesia, an 18 gauge 15 cm access needle was advanced transhepatic Lea under direct ultrasound into the abscess. Needle position confirmed with ultrasound. Syringe aspiration yielded purulent fluid. Sample sent for culture. Amplatz guidewire inserted followed by tract dilatation to insert a 12 [place] drain. Drain catheter position confirmed with ultrasound. Syringe aspiration yielded 660 cc total purulent fluid. This collapsed abscess cavity. Catheter secured with Prolene suture and connected to external suction bulb. No immediate complication. Patient tolerated the procedure well. IMPRESSION: Successful ultrasound left hepatic subcapsular abscess drain insertion. Electronically Signed   By: Judie Petit.  Shick M.D.   On: 07/03/2018 16:12   US Abdomen Limited Ruq  Result Date: 07/02/2018 CLINICAL DATA:  Right upper quadrant pain. EXAM: ULTRASOUND ABDOMEN LIMITED RIGHT UPPER QUADRANT COMPARISON:  Ultrasound June 28, 2018 FINDINGS: Gallbladder: There are multiple stones in the gallbladder with the largest  measuring 1.8 cm. The gallbladder is contracted. No wall thickening or Murphy's sign. No pericholecystic fluid. Common bile duct: Diameter: 7.3 mm Liver: There are 2 complicated cystic masses in the liver. The  first in the left hepatic lobe measures 11 x 9 x 9 cm. The smaller adjacent mass measures 4 x 2.4 x 2.9 cm. Portal vein is patent on color Doppler imaging with normal direction of blood flow towards the liver. IMPRESSION: 1. The gallbladder is contracted limiting evaluation. The gallbladder contains stones with the largest measuring 1.8 cm. No wall thickening, pericholecystic fluid, or Murphy's sign. 2. The common bile duct is borderline measuring 7.3 mm. Recommend correlation with labs. 3. 2 complex cystic masses in the left hepatic lobe with the largest measuring 11 cm. Recommend either an MRI with and without contrast or a contrast-enhanced CT scan for further evaluation. An MRI is favored if the patient is MRI compatible. Electronically Signed   By: Gerome Samavid  Williams III M.D   On: 07/02/2018 14:23    Labs:  CBC: Recent Labs    06/25/18 1108 07/02/18 1011 07/03/18 0326 07/04/18 0314  WBC 15.8* 15.9* 14.0* 15.3*  HGB 15.8 14.8 13.4 12.6*  HCT 47.8 46.3 42.1 40.5  PLT 309.0 318 276 299    COAGS: No results for input(s): INR, APTT in the last 8760 hours.  BMP: Recent Labs    06/25/18 1108 07/02/18 1011 07/03/18 0326 07/04/18 0314  NA 141 137 138 139  K 4.0 3.4* 3.1* 3.5  CL 101 100 98 100  CO2 29 28 33* 29  GLUCOSE 103* 121* 117* 119*  BUN 15 10 10 12   CALCIUM 9.3 8.6* 8.3* 8.3*  CREATININE 0.99 0.98 1.07 1.37*  GFRNONAA  --  >60 >60 48*  GFRAA  --  >60 >60 56*    LIVER FUNCTION TESTS: Recent Labs    06/25/18 1108 07/02/18 1011 07/03/18 0326 07/04/18 0314  BILITOT 1.2 1.4* 1.2 1.5*  AST 46* 45* 40 50*  ALT 44 49* 43 47*  ALKPHOS 122* 149* 119 121  PROT 6.9 6.8 5.7* 5.8*  ALBUMIN 3.0* 2.3* 2.0* 1.9*    Assessment and Plan:  84 y/o M s/p perihepatic aspiration and drainage yesterday in IR with Dr. Miles CostainShick. 660 cc (!) pus aspirated during procedure, per I/O additional 115 cc output. Preliminary culture (+) for strep anginosis, currently on Zosyn per primary team. Per  RN at bedside being tested for C. Diff currently. Afebrile, WBC 15.3 - was 14.0 yesterday.  Continue TID flushes with 3-5 cc NS, record drain output daily  IR will continue to follow. Please call with questions or concerns.   Electronically Signed: Villa HerbShannon A Noura Purpura, PA-C 07/04/2018, 11:21 AM   I spent a total of 15 Minutes at the the patient's bedside AND on the patient's hospital floor or unit, greater than 50% of which was counseling/coordinating care for follow up perihepatic abscess drain.

## 2018-07-04 NOTE — Telephone Encounter (Signed)
Best number [phone] Dr Truitt Merle returned your call

## 2018-07-05 LAB — CBC
HCT: 38.3 % — ABNORMAL LOW (ref 39.0–52.0)
Hemoglobin: 12.4 g/dL — ABNORMAL LOW (ref 13.0–17.0)
MCH: 33.1 pg (ref 26.0–34.0)
MCHC: 32.4 g/dL (ref 30.0–36.0)
MCV: 102.1 fL — ABNORMAL HIGH (ref 80.0–100.0)
NRBC: 0 % (ref 0.0–0.2)
Platelets: 326 10*3/uL (ref 150–400)
RBC: 3.75 MIL/uL — ABNORMAL LOW (ref 4.22–5.81)
RDW: 12.3 % (ref 11.5–15.5)
WBC: 12.5 10*3/uL — ABNORMAL HIGH (ref 4.0–10.5)

## 2018-07-05 LAB — BASIC METABOLIC PANEL
Anion gap: 9 (ref 5–15)
BUN: 12 mg/dL (ref 8–23)
CO2: 28 mmol/L (ref 22–32)
Calcium: 8.3 mg/dL — ABNORMAL LOW (ref 8.9–10.3)
Chloride: 100 mmol/L (ref 98–111)
Creatinine, Ser: 1.01 mg/dL (ref 0.61–1.24)
GFR calc Af Amer: >60 mL/min (ref >60)
GFR calc non Af Amer: >60 mL/min (ref >60)
Glucose, Bld: 116 mg/dL — ABNORMAL HIGH (ref 70–99)
Potassium: 3.3 mmol/L — ABNORMAL LOW (ref 3.5–5.1)
Sodium: 137 mmol/L (ref 135–145)

## 2018-07-05 MED ORDER — PENICILLIN G POTASSIUM 20000000 UNITS IJ SOLR
12.0000 10*6.[IU] | Freq: Two times a day (BID) | INTRAVENOUS | Status: DC
  Administered 2018-07-05 – 2018-07-06 (×2): 12 10*6.[IU] via INTRAVENOUS
  Filled 2018-07-05 (×3): qty 12

## 2018-07-05 MED ORDER — ENOXAPARIN SODIUM 40 MG/0.4ML ~~LOC~~ SOLN: 40.0000 mg | SUBCUTANEOUS | Status: DC

## 2018-07-05 MED ORDER — POTASSIUM CHLORIDE CRYS ER 20 MEQ PO TBCR
40.0000 meq | EXTENDED_RELEASE_TABLET | Freq: Once | ORAL | Status: AC
  Administered 2018-07-05: 40 meq via ORAL
  Filled 2018-07-05: qty 2

## 2018-07-05 MED ORDER — LACTATED RINGERS IV BOLUS
500.0000 mL | Freq: Once | INTRAVENOUS | Status: AC
  Administered 2018-07-05: 500 mL via INTRAVENOUS

## 2018-07-05 NOTE — Progress Notes (Signed)
Rehab Admissions Coordinator Note:  Patient was screened by Clois Dupes for appropriateness for an Inpatient Acute Rehab Consult per PT and OT recommendations.   At this time, we are recommending Inpatient Rehab consult.  Clois Dupes 07/05/2018, 2:14 PM  I can be reached at [phone].

## 2018-07-05 NOTE — Progress Notes (Signed)
Pharmacy Antibiotic Note  Brad Reed is a 84 y.o. male admitted on 07/02/2018 with intra-abdominal infection.  Pharmacy was consulted on 2/17 for Zosyn dosing.  Afebrile, WBC 15.3>12.3K,  HR wnl,  SCr 0.98>1.07>1.37 >1.01 CrCl ~ 57 mL/min CT chest 07/02/18: liver abscess, growing Streptococcus angiosis,  sensitivites pending.   Plan: Continue Zosyn 3.375g IV every 8 hours,  Infuse over 4hrs Monitor renal function, infectious workup and progression to narrow abx.  Height: 5\' 8"  (172.7 cm) Weight: (!) 319 lb 0.1 oz (144.7 kg) IBW/kg (Calculated) : 68.4  Temp (24hrs), Avg:97.8 F (36.6 C), Min:97.6 F (36.4 C), Max:98 F (36.7 C)  Recent Labs  Lab 07/02/18 1011 07/03/18 0326 07/04/18 0314 07/05/18 0302  WBC 15.9* 14.0* 15.3* 12.5*  CREATININE 0.98 1.07 1.37* 1.01    Estimated Creatinine Clearance: 80.2 mL/min (by C-G formula based on SCr of 1.01 mg/dL).    No Known Allergies  Antimicrobials this admission: Zosyn 2/17>>  Dose adjustments this admission: n/a  Microbiology results: 2/18 liver abscess : Streptococcus angiosis, sens pending 2/19 Cdiff ag neg,  Cdiff tox: neg  Noah Delaine, RPh Clinical Pharmacist [phone] Please check AMION for all [facility] numbers 07/05/2018 12:43 PM

## 2018-07-05 NOTE — Progress Notes (Signed)
Referring Physician(s): * No referring provider recorded for this case *  Supervising Physician: Oley Balm  Patient Status:  [facility] - In-pt  Chief Complaint: Hepatic abscess  Subjective: Reports he is feeling much better.  No complaints at time of visit.  Allergies: Patient has no known allergies.  Medications: Prior to Admission medications   Medication Sig Start Date End Date Taking? Authorizing Provider  Ibuprofen 200 MG CAPS Take 200 mg by mouth as needed (pain).  05/31/10  Yes [provider]  multivitamin-lutein (OCUVITE-LUTEIN) CAPS capsule Take 1 capsule by mouth daily.   Yes [provider]  omeprazole (PRILOSEC) 20 MG capsule Take 1 capsule (20 mg total) by mouth 2 (two) times daily before a meal. 06/25/18  Yes Tillman Abide I, MD  potassium chloride (K-DUR) 10 MEQ tablet TAKE 1 TABLET (10 MEQ TOTAL) BY MOUTH 2 (TWO) TIMES DAILY. Patient taking differently: Take 10 mEq by mouth daily.  09/14/16  Yes Karie Schwalbe, MD  triamterene-hydrochlorothiazide (DYAZIDE) 37.5-25 MG capsule TAKE ONE CAPSULE BY MOUTH EVERY MORNING Patient taking differently: Take 1 capsule by mouth daily.  03/14/18  Yes Karie Schwalbe, MD     Vital Signs: BP 131/74   Pulse 80   Temp 97.6 F (36.4 C) (Oral)   Resp 20   Ht 5\' 8"  (1.727 m)   Wt (!) 319 lb 0.1 oz (144.7 kg)   SpO2 95%   BMI 48.50 kg/m   Physical Exam  NAD, alert, sitting in bedside chair Abdomen: soft, non-distended.  Mildly tender to insertion site.  Drain remains in place with mostly serous output. 60 mL recorded over past 24 hrs.   Imaging: Dg Chest 2 View  Result Date: 07/02/2018 CLINICAL DATA:  84 year old male with dehydration EXAM: CHEST - 2 VIEW COMPARISON:  None. FINDINGS: Cardiomediastinal silhouette demonstrates cardiomegaly. Low lung volumes. No pneumothorax. No confluent airspace disease. No pleural effusion. Mildly coarsened interstitial markings. No interlobular septal thickening  or evidence of central vascular congestion. No displaced fracture.  Degenerative changes of the spine. IMPRESSION: Low lung volumes with likely chronic changes and no evidence of acute cardiopulmonary disease Electronically Signed   By: Gilmer Mor D.O.   On: 07/02/2018 14:00   Ct Abdomen Pelvis W Contrast  Result Date: 07/02/2018 CLINICAL DATA:  Evaluate dehydration, suspected cholecystitis EXAM: CT ABDOMEN AND PELVIS WITH CONTRAST TECHNIQUE: Multidetector CT imaging of the abdomen and pelvis was performed using the standard protocol following bolus administration of intravenous contrast. CONTRAST:  OMNIPAQUE IOHEXOL 300 MG/ML  SOLN COMPARISON:  Abdominal ultrasound, 07/02/2018 FINDINGS: Lower chest: No acute abnormality. Hepatobiliary: There are large air and fluid containing lesions of the left lobe of the liver, the larger measuring at least 14.7 cm and a smaller adjacent lesion measuring 3.8 cm (series 3, image 14). No gallstones, gallbladder wall thickening, or biliary dilatation. Pancreas: Unremarkable. No pancreatic ductal dilatation or surrounding inflammatory changes. Spleen: Normal in size without focal abnormality. Adrenals/Urinary Tract: Adrenal glands are unremarkable. Kidneys are normal, without renal calculi, focal lesion, or hydronephrosis. Thickening of the urinary bladder, likely due to chronic outlet obstruction. Stomach/Bowel: Stomach is within normal limits. Appendix not clearly visualized. No evidence of bowel wall thickening, distention, or inflammatory changes. Vascular/Lymphatic: No significant vascular findings are present. No enlarged abdominal or pelvic lymph nodes. Reproductive: Prostatomegaly. Other: No abdominal wall hernia or abnormality. No abdominopelvic ascites. Musculoskeletal: No acute or significant osseous findings. IMPRESSION: 1. There are large air and fluid containing lesions of the  left lobe of the liver, the larger measuring at least 14.7 cm and a smaller  adjacent lesion measuring 3.8 cm (series 3, image 14). It is unclear whether one or both of these lesions communicate to the biliary system. The presence of air is concerning for liver abscess. As on prior ultrasound examinations, contrast enhanced MRI may be useful to further characterize. 2. Gallstones described on prior ultrasound examinations are not well appreciated by CT. Electronically Signed   By: Lauralyn Primes M.D.   On: 07/02/2018 15:58   Ir Guided Horace Porteous W Catheter Placement  Result Date: 07/03/2018 INDICATION: Midline subcapsular hepatic abscess EXAM: Ultrasound subcapsular hepatic abscess drain MEDICATIONS: The patient is currently admitted to the hospital and receiving intravenous antibiotics. The antibiotics were administered within an appropriate time frame prior to the initiation of the procedure. ANESTHESIA/SEDATION: Fentanyl 100 mcg IV; Versed 2.0 mg IV Moderate Sedation Time:  18 minutes The patient was continuously monitored during the procedure by the interventional radiology nurse under my direct supervision. COMPLICATIONS: None immediate. PROCEDURE: Informed written consent was obtained from the patient after a thorough discussion of the procedural risks, benefits and alternatives. All questions were addressed. Maximal Sterile Barrier Technique was utilized including caps, mask, sterile gowns, sterile gloves, sterile drape, hand hygiene and skin antiseptic. A timeout was performed prior to the initiation of the procedure. Previous imaging reviewed. Preliminary ultrasound performed. The subhepatic abscess was localized from a subxiphoid window in the midline. A transhepatic approach to the left hepatic lobe was localized and marked. Under sterile conditions and local anesthesia, an 18 gauge 15 cm access needle was advanced transhepatic Lea under direct ultrasound into the abscess. Needle position confirmed with ultrasound. Syringe aspiration yielded purulent fluid. Sample sent for culture.  Amplatz guidewire inserted followed by tract dilatation to insert a 12 [place] drain. Drain catheter position confirmed with ultrasound. Syringe aspiration yielded 660 cc total purulent fluid. This collapsed abscess cavity. Catheter secured with Prolene suture and connected to external suction bulb. No immediate complication. Patient tolerated the procedure well. IMPRESSION: Successful ultrasound left hepatic subcapsular abscess drain insertion. Electronically Signed   By: Judie Petit.  Shick M.D.   On: 07/03/2018 16:12   US Abdomen Limited Ruq  Result Date: 07/02/2018 CLINICAL DATA:  Right upper quadrant pain. EXAM: ULTRASOUND ABDOMEN LIMITED RIGHT UPPER QUADRANT COMPARISON:  Ultrasound June 28, 2018 FINDINGS: Gallbladder: There are multiple stones in the gallbladder with the largest measuring 1.8 cm. The gallbladder is contracted. No wall thickening or Murphy's sign. No pericholecystic fluid. Common bile duct: Diameter: 7.3 mm Liver: There are 2 complicated cystic masses in the liver. The first in the left hepatic lobe measures 11 x 9 x 9 cm. The smaller adjacent mass measures 4 x 2.4 x 2.9 cm. Portal vein is patent on color Doppler imaging with normal direction of blood flow towards the liver. IMPRESSION: 1. The gallbladder is contracted limiting evaluation. The gallbladder contains stones with the largest measuring 1.8 cm. No wall thickening, pericholecystic fluid, or Murphy's sign. 2. The common bile duct is borderline measuring 7.3 mm. Recommend correlation with labs. 3. 2 complex cystic masses in the left hepatic lobe with the largest measuring 11 cm. Recommend either an MRI with and without contrast or a contrast-enhanced CT scan for further evaluation. An MRI is favored if the patient is MRI compatible. Electronically Signed   By: Gerome Sam III M.D   On: 07/02/2018 14:23    Labs:  CBC: Recent Labs    07/02/18 1011 07/03/18  [id number] 07/04/18 0314 07/05/18 0302  WBC 15.9* 14.0* 15.3* 12.5*  HGB  14.8 13.4 12.6* 12.4*  HCT 46.3 42.1 40.5 38.3*  PLT 318 276 299 326    COAGS: No results for input(s): INR, APTT in the last 8760 hours.  BMP: Recent Labs    07/02/18 1011 07/03/18 0326 07/04/18 0314 07/05/18 0302  NA 137 138 139 137  K 3.4* 3.1* 3.5 3.3*  CL 100 98 100 100  CO2 28 33* 29 28  GLUCOSE 121* 117* 119* 116*  BUN 10 10 12 12   CALCIUM 8.6* 8.3* 8.3* 8.3*  CREATININE 0.98 1.07 1.37* 1.01  GFRNONAA >60 >60 48* >60  GFRAA >60 >60 56* >60    LIVER FUNCTION TESTS: Recent Labs    06/25/18 1108 07/02/18 1011 07/03/18 0326 07/04/18 0314  BILITOT 1.2 1.4* 1.2 1.5*  AST 46* 45* 40 50*  ALT 44 49* 43 47*  ALKPHOS 122* 149* 119 121  PROT 6.9 6.8 5.7* 5.8*  ALBUMIN 3.0* 2.3* 2.0* 1.9*    Assessment and Plan: Hepatic abscess s/p drain placement 2/18 by Dr. Miles CostainShick.  Patient improved today.  WBC down to 12.5 No fevers.  On Zosyn.  Culture + for streptococcus.  Continue drain management.  IR to follow.  Electronically Signed: Hoyt KochKacie Sue-Ellen Lariah Fleer, PA 07/05/2018, 11:52 AM   I spent a total of 15 Minutes at the the patient's bedside AND on the patient's hospital floor or unit, greater than 50% of which was counseling/coordinating care for hepatic abscess.

## 2018-07-05 NOTE — Progress Notes (Signed)
Initial Nutrition Assessment  DOCUMENTATION CODES:   Morbid obesity  INTERVENTION:    Boost Breeze po TID, each supplement provides 250 kcal and 9 grams of protein  NUTRITION DIAGNOSIS:   Increased nutrient needs related to acute illness as evidenced by estimated needs  GOAL:   Patient will meet greater than or equal to 90% of their needs  MONITOR:   PO intake, Supplement acceptance, Labs, Skin, I & O's, Weight trends  REASON FOR ASSESSMENT:   Malnutrition Screening Tool  ASSESSMENT:   84 y.o. Male with PMH significant of OSA not on CPAP; macular degeneration; HTN; BPH; and angiodysplasia in 2007 presenting with dehydration. Pt now admitted with hepatic abscess and elevated LFT's.     2/18 s/p hepatic aspiration and drainage per IR  RD spoke with patient; he is resting in his recliner. Reports his appetite is "picking back up;" just received lunch. Shares he experienced a decreased appetite PTA; ate smaller portions.  Pt has Boost Breeze ordered; he does like them. Labs & medications reviewed. K 3.3 (L). Reports his weight has been stable.  Pt expressed he would like to move to his bed; Nurse Tech notified.  NUTRITION - FOCUSED PHYSICAL EXAM:    Most Recent Value  Orbital Region  No depletion  Upper Arm Region  No depletion  Thoracic and Lumbar Region  No depletion  Buccal Region  No depletion  Temple Region  No depletion  Clavicle Bone Region  No depletion  Clavicle and Acromion Bone Region  No depletion  Scapular Bone Region  No depletion  Dorsal Hand  No depletion  Patellar Region  No depletion  Anterior Thigh Region  No depletion  Posterior Calf Region  No depletion  Edema (RD Assessment)  None     Diet Order:   Diet Order            Diet Heart Room service appropriate? Yes; Fluid consistency: Thin  Diet effective now             EDUCATION NEEDS:   No education needs have been identified at this time  Skin:  Skin Assessment: Reviewed RN  Assessment  Last BM:  2/21  Height:   Ht Readings from Last 1 Encounters:  07/02/18 5\' 8"  (1.727 m)   Weight:   Wt Readings from Last 1 Encounters:  07/05/18 (!) 144.7 kg   BMI:  Body mass index is 48.5 kg/m.  Estimated Nutritional Needs:   Kcal:  2200-2400  Protein:  110-125 gm  Fluid:  2.2-2.4 L  Maureen Chatters, RD, LDN Pager #: [phone] After-Hours Pager #: [phone]

## 2018-07-05 NOTE — Progress Notes (Signed)
Inpatient Rehabilitation Admissions Coordinator  Inpatient acute Rehab consult received. I met with patient and his daughter in law at bedside for assessment. We discussed goals and expectations of an inpt rehab admit. Patient has limited assistance at home and daughter in law feels he may need longer recuperation for therapy and IV antibiotics than CIR can offer. Patient and daughter in law are requesting an ID consult. I will follow up tomorrow to further discuss dispo of CIR vs SNF.   , RN, MSN Rehab Admissions Coordinator [phone] 07/05/2018 4:25 PM  

## 2018-07-05 NOTE — Evaluation (Signed)
Occupational Therapy Evaluation Patient Details Name: ANTORIO CAVANAUGH MRN: [id number] DOB: 1934/11/14 Today's Date: 07/05/2018    History of Present Illness MAIKA TIM is a 84 y.o. male with medical history significant of OSA not on CPAP; macular degeneration; HTN; BPH; and angiodysplasia in 2007 presenting with dehydration. Pt now admitted with hepatic abscess and elevated LFT's    Clinical Impression   Pt admitted with above dx and now presenting with generalized weakness and balance deficits that impact his ability to perform ADLs and transfers with PLOF. Pt currently requiring mod A to perform LB ADLs, min A during transfers. Pt is motivated and was independent PTA, and would therefore benefit from CIR placement to ensure safe transition home. OT will continue to follow acutely.     Follow Up Recommendations  CIR    Equipment Recommendations  3 in 1 bedside commode    Recommendations for Other Services PT consult;Rehab consult     Precautions / Restrictions Precautions Precautions: Fall Precaution Comments: Foley and rectal pouch Restrictions Weight Bearing Restrictions: No      Mobility Bed Mobility Overal bed mobility: Needs Assistance Bed Mobility: Supine to Sit     Supine to sit: Min guard     General bed mobility comments: cueing for UE placement/initiation   Transfers Overall transfer level: Needs assistance Equipment used: Rolling walker (2 wheeled) Transfers: Sit to/from [employer] Sit to Stand: Min assist;From elevated surface;+2 safety/equipment Stand pivot transfers: Min assist;From elevated surface       General transfer comment: Moderate cueing for UE placement     Balance Overall balance assessment: Needs assistance Sitting-balance support: Feet supported;No upper extremity supported Sitting balance-Leahy Scale: Fair     Standing balance support: During functional activity;Bilateral upper extremity supported Standing  balance-Leahy Scale: Poor Standing balance comment: Requires contact on RW during dynamic balance, able to briefly maintain static balance without UE support                           ADL either performed or assessed with clinical judgement   ADL Overall ADL's : Needs assistance/impaired Eating/Feeding: Modified independent;Sitting   Grooming: Wash/dry hands;Wash/dry face;Oral care;Sitting;Supervision/safety   Upper Body Bathing: Supervision/ safety;Sitting   Lower Body Bathing: Moderate assistance;Sit to/from stand   Upper Body Dressing : Supervision/safety;Sitting   Lower Body Dressing: Moderate assistance;Sit to/from stand   Toilet Transfer: Minimal assistance;RW;Stand-pivot   Toileting- Clothing Manipulation and Hygiene: Minimal assistance;Sit to/from stand       Functional mobility during ADLs: Minimal assistance;Rolling walker;+2 for safety/equipment       Vision Baseline Vision/History: No visual deficits Patient Visual Report: No change from baseline Vision Assessment?: No apparent visual deficits            Pertinent Vitals/Pain Pain Assessment: No/denies pain     Hand Dominance Right   Extremity/Trunk Assessment Upper Extremity Assessment Upper Extremity Assessment: Generalized weakness(4-/5)   Lower Extremity Assessment Lower Extremity Assessment: Defer to PT evaluation   Cervical / Trunk Assessment Cervical / Trunk Assessment: Kyphotic(cueing required for upright posture)   Communication Communication Communication: No difficulties   Cognition Arousal/Alertness: Awake/alert Behavior During Therapy: WFL for tasks assessed/performed Overall Cognitive Status: Within Functional Limits for tasks assessed                                 General Comments: Pt pleasant and motivated to participate  Home Living Family/patient expects to be discharged to:: Private residence Living Arrangements: Other  relatives(sister) Available Help at Discharge: Available 24 hours/day Type of Home: House Home Access: Stairs to enter [employer] of Steps: 1 Entrance Stairs-Rails: ("post" he can reach) Home Layout: One level     Bathroom Shower/Tub: Chief Strategy Officer: Standard Bathroom Accessibility: Yes How Accessible: Accessible via walker Home Equipment: Shower seat;Walker - 4 wheels;Cane - single point   Additional Comments: Pt alternated between rollator, cane, and no AD when in the house      Prior Functioning/Environment Level of Independence: Independent with assistive device(s)        Comments: Pt not responsible for cooking or shopping, but performed all other IADLs        OT Problem List: Decreased strength;Decreased activity tolerance;Impaired balance (sitting and/or standing);Decreased knowledge of use of DME or AE;Decreased knowledge of precautions;Cardiopulmonary status limiting activity;Obesity;Pain      OT Treatment/Interventions: Self-care/ADL training;Therapeutic exercise;Energy conservation;DME and/or AE instruction;Therapeutic activities;Patient/family education;Balance training    OT Goals(Current goals can be found in the care plan section) Acute Rehab OT Goals Patient Stated Goal: "do what I can to get stronger"  OT Goal Formulation: With patient Time For Goal Achievement: 07/13/18 Potential to Achieve Goals: Good  OT Frequency: Min 3X/week   Barriers to D/C            Co-evaluation PT/OT/SLP Co-Evaluation/Treatment: Yes Reason for Co-Treatment: For patient/therapist safety;To address functional/ADL transfers   OT goals addressed during session: ADL's and self-care;Proper use of Adaptive equipment and DME      AM-PAC OT "6 Clicks" Daily Activity     Outcome Measure Help from another person eating meals?: None Help from another person taking care of personal grooming?: A Little Help from another person toileting, which  includes using toliet, bedpan, or urinal?: A Lot Help from another person bathing (including washing, rinsing, drying)?: A Little Help from another person to put on and taking off regular upper body clothing?: A Little Help from another person to put on and taking off regular lower body clothing?: A Lot 6 Click Score: 17   End of Session Equipment Utilized During Treatment: Rolling walker Nurse Communication: Mobility status  Activity Tolerance: Patient tolerated treatment well Patient left: in chair;with call bell/phone within reach  OT Visit Diagnosis: Unsteadiness on feet (R26.81);Muscle weakness (generalized) (M62.81);History of falling (Z91.81)                Time: 9211-9417 OT Time Calculation (min): 39 min Charges:  OT General Charges $OT Visit: 1 Visit OT Evaluation $OT Eval Low Complexity: 1 Low OT Treatments $Self Care/Home Management : 8-22 mins  Crissie Reese OTR/L  07/05/2018, 10:50 AM

## 2018-07-05 NOTE — Progress Notes (Signed)
[facility]  PROGRESS NOTE  Brad Reed JDB:[id number] DOB: 06-13-1934 DOA: 07/02/2018 PCP: Karie Schwalbe, MD   Brief HPI:   84 year old male with history of obstructive sleep apnea not on CPAP, macular degeneration, hypertension, BPH, angiodysplasia in 2007 came with dehydration, generalized weakness.  Patient also has poor p.o. intake for past 3 weeks.  Complains of nausea but no vomiting.  In the ED initially was started on IV Zosyn for possible cholangitis for right upper quadrant pain.  CT abdomen showed liver abscess.  Patient underwent drain placement for the liver abscess.    Subjective   Patient seen and examined, denies abdominal pain.   Assessment/Plan:     1. Liver abscess-status post aspiration and drain placement. Culture from the abscess is growing Streptococcus angiosis.  Continue IV Zosyn.  Final culture and sensitivity is pending.  IR is following  2. Diarrhea-C. difficile PCR obtained this morning is negative.  Will start Imodium as needed.  3. Hypertension-Blood pressure is stable,Dyazide is currently on hold.  Start hydralazine PRN  4. Impaired fasting glucose-hemoglobin A1c is 5.2.  5. Hypokalemia-potassium is 3.3, will replace potassium and check BMP in a.m.    CBG: No results for input(s): GLUCAP in the last 168 hours.  CBC: Recent Labs  Lab 07/02/18 1011 07/03/18 0326 07/04/18 0314 07/05/18 0302  WBC 15.9* 14.0* 15.3* 12.5*  HGB 14.8 13.4 12.6* 12.4*  HCT 46.3 42.1 40.5 38.3*  MCV 100.9* 101.2* 102.8* 102.1*  PLT 318 276 299 326    Basic Metabolic Panel: Recent Labs  Lab 07/02/18 1011 07/03/18 0326 07/04/18 0314 07/05/18 0302  NA 137 138 139 137  K 3.4* 3.1* 3.5 3.3*  CL 100 98 100 100  CO2 28 33* 29 28  GLUCOSE 121* 117* 119* 116*  BUN 10 10 12 12   CREATININE 0.98 1.07 1.37* 1.01  CALCIUM 8.6* 8.3* 8.3* 8.3*     DVT prophylaxis: SCds  Code Status: DNR  Family Communication: No family at bedside  Disposition  Plan: likely home when medically ready for discharge     Consultants:  GI  Procedures:  Drain placement for liver abscess per IR   Antibiotics:   Anti-infectives (From admission, onward)   Start     Dose/Rate Route Frequency Ordered Stop   07/02/18 2300  piperacillin-tazobactam (ZOSYN) IVPB 3.375 g     3.375 g 12.5 mL/hr over 240 Minutes Intravenous Every 8 hours 07/02/18 1507     07/02/18 1515  piperacillin-tazobactam (ZOSYN) IVPB 3.375 g     3.375 g 100 mL/hr over 30 Minutes Intravenous  Once 07/02/18 1501 07/02/18 1658       Objective   Vitals:   07/04/18 1640 07/04/18 2335 07/05/18 0530 07/05/18 0809  BP: 97/60 (!) 129/52  131/74  Pulse: 72 86  80  Resp:  20    Temp: 97.8 F (36.6 C) 98 F (36.7 C)  97.6 F (36.4 C)  TempSrc: Oral Oral  Oral  SpO2: 96% 97%  95%  Weight:   (!) 144.7 kg   Height:        Intake/Output Summary (Last 24 hours) at 07/05/2018 1220 Last data filed at 07/05/2018 0659 Gross per 24 hour  Intake 1647.46 ml  Output 485 ml  Net 1162.46 ml   Filed Weights   07/02/18 2029 07/05/18 0530  Weight: 135.9 kg (!) 144.7 kg     Physical Examination:   General: Appears in no acute distress  Cardiovascular: S1-S2, regular, no murmur auscultated  Respiratory: Clear to auscultation bilaterally  Abdomen: Abdomen soft, nontender, drain in place  Extremities: No edema of the lower extremities  Neurologic: Alert, oriented x3, no focal deficit noted      Data Reviewed: I have personally reviewed following labs and imaging studies   Recent Results (from the past 240 hour(s))  Aerobic/Anaerobic Culture (surgical/deep wound)     Status: None (Preliminary result)   Collection Time: 07/03/18  2:35 PM  Result Value Ref Range Status   Specimen Description ABSCESS LIVER  Final   Special Requests Normal  Final   Gram Stain   Final    ABUNDANT WBC PRESENT,BOTH PMN AND MONONUCLEAR ABUNDANT GRAM POSITIVE COCCI IN PAIRS AND CHAINS FEW  GRAM NEGATIVE RODS FEW GRAM VARIABLE ROD Performed at [facility], [address] [place], [place] [address]    Culture ABUNDANT STREPTOCOCCUS ANGINOSIS  Final   Report Status PENDING  Incomplete  C difficile quick scan w PCR reflex     Status: None   Collection Time: 07/04/18  8:50 AM  Result Value Ref Range Status   C Diff antigen NEGATIVE NEGATIVE Final   C Diff toxin NEGATIVE NEGATIVE Final   C Diff interpretation No C. difficile detected.  Final    Comment: Performed at [facility], [address] [place], [place] [address]     Liver Function Tests: Recent Labs  Lab 07/02/18 1011 07/03/18 0326 07/04/18 0314  AST 45* 40 50*  ALT 49* 43 47*  ALKPHOS 149* 119 121  BILITOT 1.4* 1.2 1.5*  PROT 6.8 5.7* 5.8*  ALBUMIN 2.3* 2.0* 1.9*   Recent Labs  Lab 07/02/18 1011  LIPASE 35   No results for input(s): AMMONIA in the last 168 hours.  Cardiac Enzymes: No results for input(s): CKTOTAL, CKMB, CKMBINDEX, TROPONINI in the last 168 hours. BNP (last 3 results) No results for input(s): BNP in the last 8760 hours.  ProBNP (last 3 results) No results for input(s): PROBNP in the last 8760 hours.    Studies: Ir Era SkeenGuided Drain W Catheter Placement  Result Date: 07/03/2018 INDICATION: Midline subcapsular hepatic abscess EXAM: Ultrasound subcapsular hepatic abscess drain MEDICATIONS: The patient is currently admitted to the hospital and receiving intravenous antibiotics. The antibiotics were administered within an appropriate time frame prior to the initiation of the procedure. ANESTHESIA/SEDATION: Fentanyl 100 mcg IV; Versed 2.0 mg IV Moderate Sedation Time:  18 minutes The patient was continuously monitored during the procedure by the interventional radiology nurse under my direct supervision. COMPLICATIONS: None immediate. PROCEDURE: Informed written consent was obtained from the patient after a thorough discussion of the procedural risks, benefits and alternatives.  All questions were addressed. Maximal Sterile Barrier Technique was utilized including caps, mask, sterile gowns, sterile gloves, sterile drape, hand hygiene and skin antiseptic. A timeout was performed prior to the initiation of the procedure. Previous imaging reviewed. Preliminary ultrasound performed. The subhepatic abscess was localized from a subxiphoid window in the midline. A transhepatic approach to the left hepatic lobe was localized and marked. Under sterile conditions and local anesthesia, an 18 gauge 15 cm access needle was advanced transhepatic Lea under direct ultrasound into the abscess. Needle position confirmed with ultrasound. Syringe aspiration yielded purulent fluid. Sample sent for culture. Amplatz guidewire inserted followed by tract dilatation to insert a 12 [place] drain. Drain catheter position confirmed with ultrasound. Syringe aspiration yielded 660 cc total purulent fluid. This collapsed abscess cavity. Catheter secured with Prolene suture and connected to external suction bulb.  No immediate complication. Patient tolerated the procedure well. IMPRESSION: Successful ultrasound left hepatic subcapsular abscess drain insertion. Electronically Signed   By: Judie Petit.  Shick M.D.   On: 07/03/2018 16:12    Scheduled Meds: . acidophilus  2 capsule Oral Daily  . feeding supplement  1 Container Oral TID BM  . heparin injection (subcutaneous)  5,000 Units Subcutaneous Q8H  . multivitamin  1 tablet Oral Daily  . pantoprazole  40 mg Oral Q0600  . saccharomyces boulardii  250 mg Oral BID  . sodium chloride flush  5 mL Intracatheter Q8H    Admission status: Inpatient: Based on patients clinical presentation and evaluation of above clinical data, I have made determination that patient meets Inpatient criteria at this time.  Time spent: 20 min  Meredeth Ide   [facility] Pager [phone]. If 7PM-7AM, please contact night-coverage at [email or web address], Office  [phone]  password  TRH1  07/05/2018, 12:20 PM  LOS: 3 days

## 2018-07-05 NOTE — Progress Notes (Signed)
Physical Therapy Evaluation Patient Details Name: Brad Reed MRN: [id number] DOB: Aug 06, 1934 Today's Date: 07/05/2018   History of Present Illness  Brad Reed is a 84 y.o. male with medical history significant of OSA not on CPAP; macular degeneration; HTN; BPH; and angiodysplasia in 2007 presenting with dehydration. Pt now admitted with hepatic abscess and elevated LFT's     Clinical Impression  Pt admitted with above diagnosis. Pt currently with functional limitations due to the deficits listed below (see PT Problem List). PTA, pt living with sister, independent with mobility utilizing rollator  Occasionally. Today, patient with deconditioning and weakness from bedrest, reports he is far from baseline. Requires min to mod A to stand and pivot to chair, unable to safely walk at this time. Will progress activity, discussed post acute rehab pt agreeable. desat to 85% from transfer, returns to 92% with rest.    Pt will benefit from skilled PT to increase their independence and safety with mobility to allow discharge to the venue listed below.       Follow Up Recommendations CIR    Equipment Recommendations  (TBD)    Recommendations for Other Services       Precautions / Restrictions Precautions Precautions: Fall Precaution Comments: Foley and rectal pouch Restrictions Weight Bearing Restrictions: No      Mobility  Bed Mobility Overal bed mobility: Needs Assistance Bed Mobility: Supine to Sit     Supine to sit: Min guard     General bed mobility comments: cueing for UE placement/initiation   Transfers Overall transfer level: Needs assistance Equipment used: Rolling walker (2 wheeled) Transfers: Sit to/from [employer] Sit to Stand: Min assist;From elevated surface;+2 safety/equipment Stand pivot transfers: Min assist;From elevated surface       General transfer comment: Moderate cueing for UE placement   Ambulation/Gait             General  Gait Details: unable to safely at this time  Stairs            Wheelchair Mobility    Modified Rankin (Stroke Patients Only)       Balance Overall balance assessment: Needs assistance Sitting-balance support: Feet supported;No upper extremity supported Sitting balance-Leahy Scale: Fair     Standing balance support: During functional activity;Bilateral upper extremity supported Standing balance-Leahy Scale: Poor Standing balance comment: Requires contact on RW during dynamic balance, able to briefly maintain static balance without UE support                             Pertinent Vitals/Pain Pain Assessment: No/denies pain    Home Living Family/patient expects to be discharged to:: Private residence Living Arrangements: Other relatives Available Help at Discharge: Available 24 hours/day Type of Home: House Home Access: Stairs to enter Entrance Stairs-Rails: Right Entrance Stairs-Number of Steps: 1 Home Layout: One level Home Equipment: Emergency planning/management officer - 4 wheels;Cane - single point Additional Comments: Pt alternated between rollator, cane, and no AD when in the house    Prior Function Level of Independence: Independent with assistive device(s)         Comments: Pt not responsible for cooking or shopping, but performed all other IADLs     Hand Dominance   Dominant Hand: Right    Extremity/Trunk Assessment   Upper Extremity Assessment Upper Extremity Assessment: Generalized weakness    Lower Extremity Assessment Lower Extremity Assessment: Defer to PT evaluation    Cervical / Trunk Assessment  Cervical / Trunk Assessment: Kyphotic  Communication   Communication: No difficulties  Cognition Arousal/Alertness: Awake/alert Behavior During Therapy: WFL for tasks assessed/performed Overall Cognitive Status: Within Functional Limits for tasks assessed                                 General Comments: Pt pleasant and motivated  to participate       General Comments      Exercises     Assessment/Plan    PT Assessment Patient needs continued PT services  PT Problem List Decreased strength;Decreased activity tolerance;Decreased range of motion;Decreased balance;Decreased mobility;Decreased coordination;Decreased cognition       PT Treatment Interventions DME instruction;Stair training;Gait training;Functional mobility training;Therapeutic activities;Therapeutic exercise;Balance training;Neuromuscular re-education    PT Goals (Current goals can be found in the Care Plan section)  Acute Rehab PT Goals Patient Stated Goal: "do what I can to get stronger"  PT Goal Formulation: With patient Time For Goal Achievement: 07/19/18 Potential to Achieve Goals: Good    Frequency Min 3X/week   Barriers to discharge Decreased caregiver support      Co-evaluation PT/OT/SLP Co-Evaluation/Treatment: Yes Reason for Co-Treatment: Complexity of the patient's impairments (multi-system involvement);Necessary to address cognition/behavior during functional activity;For patient/therapist safety           AM-PAC PT "6 Clicks" Mobility  Outcome Measure Help needed turning from your back to your side while in a flat bed without using bedrails?: A Little Help needed moving from lying on your back to sitting on the side of a flat bed without using bedrails?: A Little Help needed moving to and from a bed to a chair (including a wheelchair)?: A Lot Help needed standing up from a chair using your arms (e.g., wheelchair or bedside chair)?: A Lot Help needed to walk in hospital room?: A Lot Help needed climbing 3-5 steps with a railing? : Total 6 Click Score: 13    End of Session Equipment Utilized During Treatment: Gait belt Activity Tolerance: Patient tolerated treatment well Patient left: in chair;with chair alarm set Nurse Communication: Mobility status PT Visit Diagnosis: Unsteadiness on feet (R26.81);Other  abnormalities of gait and mobility (R26.89)    Time: 6213-0865 PT Time Calculation (min) (ACUTE ONLY): 28 min   Charges:   PT Evaluation $PT Eval Low Complexity: 1 Low          Etta Grandchild, PT, DPT Acute Rehabilitation Services Pager: [phone] Office: [phone]    Etta Grandchild 07/05/2018, 2:03 PM

## 2018-07-05 NOTE — Care Management Important Message (Signed)
Important Message  Patient Details  Name: Brad Reed MRN: [id number] Date of Birth: 1934-09-28   Medicare Important Message Given:  Yes    Ashar Lewinski 07/05/2018, 1:46 PM

## 2018-07-05 NOTE — Telephone Encounter (Signed)
PC with him to review status Given the unusual nature of this infection---I hope that they will get ID involved May need rehab

## 2018-07-06 DIAGNOSIS — M7989 Other specified soft tissue disorders: Secondary | ICD-10-CM

## 2018-07-06 DIAGNOSIS — G4733 Obstructive sleep apnea (adult) (pediatric): Secondary | ICD-10-CM

## 2018-07-06 DIAGNOSIS — B951 Streptococcus, group B, as the cause of diseases classified elsewhere: Secondary | ICD-10-CM

## 2018-07-06 DIAGNOSIS — N200 Calculus of kidney: Secondary | ICD-10-CM

## 2018-07-06 DIAGNOSIS — R627 Adult failure to thrive: Secondary | ICD-10-CM

## 2018-07-06 DIAGNOSIS — Z87891 Personal history of nicotine dependence: Secondary | ICD-10-CM

## 2018-07-06 LAB — BASIC METABOLIC PANEL
ANION GAP: 7 (ref 5–15)
BUN: 10 mg/dL (ref 8–23)
CALCIUM: 8.2 mg/dL — AB (ref 8.9–10.3)
CO2: 29 mmol/L (ref 22–32)
Chloride: 100 mmol/L (ref 98–111)
Creatinine, Ser: 0.97 mg/dL (ref 0.61–1.24)
GFR calc Af Amer: >60 mL/min (ref >60)
Glucose, Bld: 131 mg/dL — ABNORMAL HIGH (ref 70–99)
Potassium: 3.2 mmol/L — ABNORMAL LOW (ref 3.5–5.1)
Sodium: 136 mmol/L (ref 135–145)

## 2018-07-06 LAB — GLUCOSE, CAPILLARY: Glucose-Capillary: 104 mg/dL — ABNORMAL HIGH (ref 70–99)

## 2018-07-06 LAB — MAGNESIUM: Magnesium: 2.1 mg/dL (ref 1.7–2.4)

## 2018-07-06 MED ORDER — SODIUM CHLORIDE 0.9 % IV SOLN
1.0000 g | Freq: Once | INTRAVENOUS | Status: AC
  Administered 2018-07-06: 1 g via INTRAVENOUS
  Filled 2018-07-06: qty 10

## 2018-07-06 MED ORDER — SODIUM CHLORIDE 0.9 % IV SOLN
2.0000 g | INTRAVENOUS | Status: DC
  Administered 2018-07-06 – 2018-07-07 (×2): 2 g via INTRAVENOUS
  Filled 2018-07-06 (×3): qty 20

## 2018-07-06 MED ORDER — POTASSIUM CHLORIDE CRYS ER 20 MEQ PO TBCR
40.0000 meq | EXTENDED_RELEASE_TABLET | ORAL | Status: AC
  Administered 2018-07-06 (×2): 40 meq via ORAL
  Filled 2018-07-06 (×2): qty 2

## 2018-07-06 NOTE — Progress Notes (Signed)
Patient had IV placed early this shift and after the infusion of the Rocephin, the patient called and had tried to get up and dislodged the IV.  Anip NT advised me the patient's IV was out.  IV team paged to look and see if they can get another line as patient is on LR at /hour.

## 2018-07-06 NOTE — Progress Notes (Signed)
Inpatient Rehabilitation Admissions Coordinator  I met with patient and also spoke with his son by phone. Patient and family prefer longer rehab stay than I can offer at CIR due to his IV antibiotics, need for therapy and limited assistance at home. They prefer SNF at [facility]. I have updated RN CM and SW. We will sign off at this time.  Danne Baxter, RN, MSN Rehab Admissions Coordinator [phone] 07/06/2018 10:45 AM

## 2018-07-06 NOTE — Progress Notes (Addendum)
Referring Physician(s): Dr Drusilla Kanner   Supervising Physician: Oley Balm  Patient Status:  [facility] - In-pt  Chief Complaint:  Liver abscess drain Placed 2/18  Subjective:  Better  Less pain Feels some better Had BM this am Reg/heart diet   Allergies: Patient has no known allergies.  Medications: Prior to Admission medications   Medication Sig Start Date End Date Taking? Authorizing Provider  Ibuprofen 200 MG CAPS Take 200 mg by mouth as needed (pain).  05/31/10  Yes [provider]  multivitamin-lutein (OCUVITE-LUTEIN) CAPS capsule Take 1 capsule by mouth daily.   Yes [provider]  omeprazole (PRILOSEC) 20 MG capsule Take 1 capsule (20 mg total) by mouth 2 (two) times daily before a meal. 06/25/18  Yes Tillman Abide I, MD  potassium chloride (K-DUR) 10 MEQ tablet TAKE 1 TABLET (10 MEQ TOTAL) BY MOUTH 2 (TWO) TIMES DAILY. Patient taking differently: Take 10 mEq by mouth daily.  09/14/16  Yes Karie Schwalbe, MD  triamterene-hydrochlorothiazide (DYAZIDE) 37.5-25 MG capsule TAKE ONE CAPSULE BY MOUTH EVERY MORNING Patient taking differently: Take 1 capsule by mouth daily.  03/14/18  Yes Karie Schwalbe, MD     Vital Signs: BP 122/70 (BP Location: Right Arm)   Pulse 88   Temp 98.1 F (36.7 C) (Oral)   Resp 20   Ht 5\' 8"  (1.727 m)   Wt (!) 319 lb 0.1 oz (144.7 kg)   SpO2 94%   BMI 48.50 kg/m   Physical Exam Vitals signs reviewed.  Abdominal:     General: Bowel sounds are normal.  Musculoskeletal: Normal range of motion.  Skin:    General: Skin is warm and dry.     Comments: Site of drain is clean and dry NT no bleeding OP serous color 60 cc yesterday; 20 cc so far today - in JP ABUNDANT STREPTOCOCCUS ANGINOSIS   Neurological:     Mental Status: He is alert.     Imaging: Dg Chest 2 View  Result Date: 07/02/2018 CLINICAL DATA:  84 year old male with dehydration EXAM: CHEST - 2 VIEW COMPARISON:  None. FINDINGS: Cardiomediastinal  silhouette demonstrates cardiomegaly. Low lung volumes. No pneumothorax. No confluent airspace disease. No pleural effusion. Mildly coarsened interstitial markings. No interlobular septal thickening or evidence of central vascular congestion. No displaced fracture.  Degenerative changes of the spine. IMPRESSION: Low lung volumes with likely chronic changes and no evidence of acute cardiopulmonary disease Electronically Signed   By: Gilmer Mor D.O.   On: 07/02/2018 14:00   Ct Abdomen Pelvis W Contrast  Result Date: 07/02/2018 CLINICAL DATA:  Evaluate dehydration, suspected cholecystitis EXAM: CT ABDOMEN AND PELVIS WITH CONTRAST TECHNIQUE: Multidetector CT imaging of the abdomen and pelvis was performed using the standard protocol following bolus administration of intravenous contrast. CONTRAST:  OMNIPAQUE IOHEXOL 300 MG/ML  SOLN COMPARISON:  Abdominal ultrasound, 07/02/2018 FINDINGS: Lower chest: No acute abnormality. Hepatobiliary: There are large air and fluid containing lesions of the left lobe of the liver, the larger measuring at least 14.7 cm and a smaller adjacent lesion measuring 3.8 cm (series 3, image 14). No gallstones, gallbladder wall thickening, or biliary dilatation. Pancreas: Unremarkable. No pancreatic ductal dilatation or surrounding inflammatory changes. Spleen: Normal in size without focal abnormality. Adrenals/Urinary Tract: Adrenal glands are unremarkable. Kidneys are normal, without renal calculi, focal lesion, or hydronephrosis. Thickening of the urinary bladder, likely due to chronic outlet obstruction. Stomach/Bowel: Stomach is within normal limits. Appendix not clearly visualized. No evidence of bowel wall  thickening, distention, or inflammatory changes. Vascular/Lymphatic: No significant vascular findings are present. No enlarged abdominal or pelvic lymph nodes. Reproductive: Prostatomegaly. Other: No abdominal wall hernia or abnormality. No abdominopelvic ascites.  Musculoskeletal: No acute or significant osseous findings. IMPRESSION: 1. There are large air and fluid containing lesions of the left lobe of the liver, the larger measuring at least 14.7 cm and a smaller adjacent lesion measuring 3.8 cm (series 3, image 14). It is unclear whether one or both of these lesions communicate to the biliary system. The presence of air is concerning for liver abscess. As on prior ultrasound examinations, contrast enhanced MRI may be useful to further characterize. 2. Gallstones described on prior ultrasound examinations are not well appreciated by CT. Electronically Signed   By: Lauralyn PrimesAlex  Bibbey M.D.   On: 07/02/2018 15:58   Ir Guided Horace Porteousrain W Catheter Placement  Result Date: 07/03/2018 INDICATION: Midline subcapsular hepatic abscess EXAM: Ultrasound subcapsular hepatic abscess drain MEDICATIONS: The patient is currently admitted to the hospital and receiving intravenous antibiotics. The antibiotics were administered within an appropriate time frame prior to the initiation of the procedure. ANESTHESIA/SEDATION: Fentanyl 100 mcg IV; Versed 2.0 mg IV Moderate Sedation Time:  18 minutes The patient was continuously monitored during the procedure by the interventional radiology nurse under my direct supervision. COMPLICATIONS: None immediate. PROCEDURE: Informed written consent was obtained from the patient after a thorough discussion of the procedural risks, benefits and alternatives. All questions were addressed. Maximal Sterile Barrier Technique was utilized including caps, mask, sterile gowns, sterile gloves, sterile drape, hand hygiene and skin antiseptic. A timeout was performed prior to the initiation of the procedure. Previous imaging reviewed. Preliminary ultrasound performed. The subhepatic abscess was localized from a subxiphoid window in the midline. A transhepatic approach to the left hepatic lobe was localized and marked. Under sterile conditions and local anesthesia, an 18  gauge 15 cm access needle was advanced transhepatic Lea under direct ultrasound into the abscess. Needle position confirmed with ultrasound. Syringe aspiration yielded purulent fluid. Sample sent for culture. Amplatz guidewire inserted followed by tract dilatation to insert a 12 [place] drain. Drain catheter position confirmed with ultrasound. Syringe aspiration yielded 660 cc total purulent fluid. This collapsed abscess cavity. Catheter secured with Prolene suture and connected to external suction bulb. No immediate complication. Patient tolerated the procedure well. IMPRESSION: Successful ultrasound left hepatic subcapsular abscess drain insertion. Electronically Signed   By: Judie PetitM.  Shick M.D.   On: 07/03/2018 16:12   [place] Abdomen Limited Ruq  Result Date: 07/02/2018 CLINICAL DATA:  Right upper quadrant pain. EXAM: ULTRASOUND ABDOMEN LIMITED RIGHT UPPER QUADRANT COMPARISON:  Ultrasound June 28, 2018 FINDINGS: Gallbladder: There are multiple stones in the gallbladder with the largest measuring 1.8 cm. The gallbladder is contracted. No wall thickening or Murphy's sign. No pericholecystic fluid. Common bile duct: Diameter: 7.3 mm Liver: There are 2 complicated cystic masses in the liver. The first in the left hepatic lobe measures 11 x 9 x 9 cm. The smaller adjacent mass measures 4 x 2.4 x 2.9 cm. Portal vein is patent on color Doppler imaging with normal direction of blood flow towards the liver. IMPRESSION: 1. The gallbladder is contracted limiting evaluation. The gallbladder contains stones with the largest measuring 1.8 cm. No wall thickening, pericholecystic fluid, or Murphy's sign. 2. The common bile duct is borderline measuring 7.3 mm. Recommend correlation with labs. 3. 2 complex cystic masses in the left hepatic lobe with the largest measuring 11 cm. Recommend either an MRI with  and without contrast or a contrast-enhanced CT scan for further evaluation. An MRI is favored if the patient is MRI compatible.  Electronically Signed   By: Gerome Sam III M.D   On: 07/02/2018 14:23    Labs:  CBC: Recent Labs    07/02/18 1011 07/03/18 0326 07/04/18 0314 07/05/18 0302  WBC 15.9* 14.0* 15.3* 12.5*  HGB 14.8 13.4 12.6* 12.4*  HCT 46.3 42.1 40.5 38.3*  PLT 318 276 299 326    COAGS: No results for input(s): INR, APTT in the last 8760 hours.  BMP: Recent Labs    07/03/18 0326 07/04/18 0314 07/05/18 0302 07/06/18 0257  NA 138 139 137 136  K 3.1* 3.5 3.3* 3.2*  CL 98 100 100 100  CO2 33* 29 28 29   GLUCOSE 117* 119* 116* 131*  BUN 10 12 12 10   CALCIUM 8.3* 8.3* 8.3* 8.2*  CREATININE 1.07 1.37* 1.01 0.97  GFRNONAA >60 48* >60 >60  GFRAA >60 56* >60 >60    LIVER FUNCTION TESTS: Recent Labs    06/25/18 1108 07/02/18 1011 07/03/18 0326 07/04/18 0314  BILITOT 1.2 1.4* 1.2 1.5*  AST 46* 45* 40 50*  ALT 44 49* 43 47*  ALKPHOS 122* 149* 119 121  PROT 6.9 6.8 5.7* 5.8*  ALBUMIN 3.0* 2.3* 2.0* 1.9*    Assessment and Plan:  Liver abscess drain place 2/18 Intact Good OP; Strept For Rehab soon If dc with drain-- will need flushed with 5-10 cc sterile saline daily Record OP daily IR will call pt with time and date of OP follow up   Electronically Signed: Robet Leu, PA-C 07/06/2018, 8:03 AM   I spent a total of 15 Minutes at the the patient's bedside AND on the patient's hospital floor or unit, greater than 50% of which was counseling/coordinating care for liver abscess drain

## 2018-07-06 NOTE — Progress Notes (Addendum)
[facility]  PROGRESS NOTE  CLAUDIA TEWKSBURY FHL:[id number] DOB: 1935/03/20 DOA: 07/02/2018 PCP: Karie Schwalbe, MD   Brief HPI:   84 year old male with history of obstructive sleep apnea not on CPAP, macular degeneration, hypertension, BPH, angiodysplasia in 2007 came with dehydration, generalized weakness.  Patient also has poor p.o. intake for past 3 weeks.  Complains of nausea but no vomiting.  In the ED initially was started on IV Zosyn for possible cholangitis for right upper quadrant pain.  CT abdomen showed liver abscess.  Patient underwent drain placement for the liver abscess.    Subjective   Patient seen and examined, denies any new complaints.  Hesitant to go to inpatient rehab.   Assessment/Plan:     1. Liver abscess-status post aspiration and drain placement. Culture from the abscess is growing Streptococcus angiosis.  Was started on  IV Zosyn.  Final culture and sensitivity is growing Streptococcus angiosis.  Antibiotics changed to penicillin.  Will consult ID for duration of IV antibiotics and follow-up plans.  2. Diarrhea-C. difficile PCR obtained  is negative.  Continue PRN Imodium.  3. Hypertension-Blood pressure is stable,Dyazide is currently on hold.  Hydralazine PRN  4. Impaired fasting glucose-hemoglobin A1c is 5.2.  5. Hypokalemia-potassium is 3.2, will replace potassium and check serum magnesium.    CBG: No results for input(s): GLUCAP in the last 168 hours.  CBC: Recent Labs  Lab 07/02/18 1011 07/03/18 0326 07/04/18 0314 07/05/18 0302  WBC 15.9* 14.0* 15.3* 12.5*  HGB 14.8 13.4 12.6* 12.4*  HCT 46.3 42.1 40.5 38.3*  MCV 100.9* 101.2* 102.8* 102.1*  PLT 318 276 299 326    Basic Metabolic Panel: Recent Labs  Lab 07/02/18 1011 07/03/18 0326 07/04/18 0314 07/05/18 0302 07/06/18 0257  NA 137 138 139 137 136  K 3.4* 3.1* 3.5 3.3* 3.2*  CL 100 98 100 100 100  CO2 28 33* 29 28 29   GLUCOSE 121* 117* 119* 116* 131*  BUN 10 10 12 12 10    CREATININE 0.98 1.07 1.37* 1.01 0.97  CALCIUM 8.6* 8.3* 8.3* 8.3* 8.2*     DVT prophylaxis: SCds  Code Status: DNR  Family Communication: No family at bedside  Disposition Plan: Skilled nursing facility     Consultants:  GI  Procedures:  Drain placement for liver abscess per IR   Antibiotics:   Anti-infectives (From admission, onward)   Start     Dose/Rate Route Frequency Ordered Stop   07/05/18 2000  penicillin G potassium 12 Million Units in dextrose 5 % 500 mL continuous infusion     12 Million Units 41.7 mL/hr over 12 Hours Intravenous Every 12 hours 07/05/18 1510     07/02/18 2300  piperacillin-tazobactam (ZOSYN) IVPB 3.375 g  Status:  Discontinued     3.375 g 12.5 mL/hr over 240 Minutes Intravenous Every 8 hours 07/02/18 1507 07/05/18 1510   07/02/18 1515  piperacillin-tazobactam (ZOSYN) IVPB 3.375 g     3.375 g 100 mL/hr over 30 Minutes Intravenous  Once 07/02/18 1501 07/02/18 1658       Objective   Vitals:   07/05/18 0809 07/05/18 1637 07/06/18 0006 07/06/18 0800  BP: 131/74 129/66 122/70 126/74  Pulse: 80 83 88 86  Resp:    18  Temp: 97.6 F (36.4 C) 98.4 F (36.9 C) 98.1 F (36.7 C) 98.2 F (36.8 C)  TempSrc: Oral Oral Oral Oral  SpO2: 95% 93% 94% 95%  Weight:      Height:  Intake/Output Summary (Last 24 hours) at 07/06/2018 1511 Last data filed at 07/06/2018 6010 Gross per 24 hour  Intake 597 ml  Output 790 ml  Net -193 ml   Filed Weights   07/02/18 2029 07/05/18 0530  Weight: 135.9 kg (!) 144.7 kg     Physical Examination:   General: Appears in no acute distress  Cardiovascular: S1-S2, regular, no murmur auscultated  Respiratory: Clear to auscultation bilaterally  Abdomen: Abdomen is soft,drain in place  Extremities: No edema in the lower extremities  Neurologic: Alert, and x3, no focal deficit noted       Data Reviewed: I have personally reviewed following labs and imaging studies   Recent Results (from  the past 240 hour(s))  Aerobic/Anaerobic Culture (surgical/deep wound)     Status: None (Preliminary result)   Collection Time: 07/03/18  2:35 PM  Result Value Ref Range Status   Specimen Description ABSCESS LIVER  Final   Special Requests Normal  Final   Gram Stain   Final    ABUNDANT WBC PRESENT,BOTH PMN AND MONONUCLEAR ABUNDANT GRAM POSITIVE COCCI IN PAIRS AND CHAINS FEW GRAM NEGATIVE RODS FEW GRAM VARIABLE ROD    Culture   Final    ABUNDANT STREPTOCOCCUS ANGINOSIS CULTURE REINCUBATED FOR BETTER GROWTH Performed at [facility], [address] [place], [place] [address]    Report Status PENDING  Incomplete   Organism ID, Bacteria STREPTOCOCCUS ANGINOSIS  Final      Susceptibility   Streptococcus anginosis - MIC*    PENICILLIN 0.12 SENSITIVE Sensitive     CEFTRIAXONE 0.5 SENSITIVE Sensitive     ERYTHROMYCIN <=0.12 SENSITIVE Sensitive     LEVOFLOXACIN 0.5 SENSITIVE Sensitive     VANCOMYCIN 0.5 SENSITIVE Sensitive     * ABUNDANT STREPTOCOCCUS ANGINOSIS  C difficile quick scan w PCR reflex     Status: None   Collection Time: 07/04/18  8:50 AM  Result Value Ref Range Status   C Diff antigen NEGATIVE NEGATIVE Final   C Diff toxin NEGATIVE NEGATIVE Final   C Diff interpretation No C. difficile detected.  Final    Comment: Performed at [facility], [address], [place], [place] [address]     Liver Function Tests: Recent Labs  Lab 07/02/18 1011 07/03/18 0326 07/04/18 0314  AST 45* 40 50*  ALT 49* 43 47*  ALKPHOS 149* 119 121  BILITOT 1.4* 1.2 1.5*  PROT 6.8 5.7* 5.8*  ALBUMIN 2.3* 2.0* 1.9*   Recent Labs  Lab 07/02/18 1011  LIPASE 35      Studies: No results found.  Scheduled Meds: . acidophilus  2 capsule Oral Daily  . feeding supplement  1 Container Oral TID BM  . heparin injection (subcutaneous)  5,000 Units Subcutaneous Q8H  . multivitamin  1 tablet Oral Daily  . pantoprazole  40 mg Oral Q0600  . saccharomyces boulardii  250 mg Oral BID   . sodium chloride flush  5 mL Intracatheter Q8H    Admission status: Inpatient: Based on patients clinical presentation and evaluation of above clinical data, I have made determination that patient meets Inpatient criteria at this time.  Time spent: 20 min  Meredeth Ide   [facility] Pager [phone]. If 7PM-7AM, please contact night-coverage at [email or web address], Office  [phone]  password [email or web address]  07/06/2018, 3:11 PM  LOS: 4 days

## 2018-07-06 NOTE — Progress Notes (Signed)
Ceftriaxone infiltrated with A little less than half of dose left in bag.  RN unable to change sites and hang rest of dose  Scheduled extra 1 g x 1 to make this up  Isaac Bliss, PharmD, BCPS, BCCCP Clinical Pharmacist [phone]  Please check AMION for all [facility] Pharmacy numbers  07/06/2018 9:25 PM

## 2018-07-06 NOTE — Consult Note (Signed)
[facility]ious [facility]    Date of Admission:  07/02/2018   Total days of antibiotics: 3 zosyn --> PEN               Reason for Consult: liver abscess    Referring Provider: Lama   Assessment: Liver abscess Failure to thrive  Plan: 1. Place PIC 2. Change anbx to ceftriaxone (or could use continuous infusion PEN) 3. Continue IV anbx for 3 weeks, repeat CT scan at that time to determine total length of therapy 4. F/u Drain clinic 5. Glad to see in ID clinic to f/u.    Thank you so much for this interesting consult,  Principal Problem:   Hepatic abscess Active Problems:   Essential hypertension, benign   Obesity, Class III, BMI 40-49.9 (morbid obesity) (HCC)   Impaired fasting glucose   Abnormal LFTs   Pressure injury of skin   . acidophilus  2 capsule Oral Daily  . feeding supplement  1 Container Oral TID BM  . heparin injection (subcutaneous)  5,000 Units Subcutaneous Q8H  . multivitamin  1 tablet Oral Daily  . pantoprazole  40 mg Oral Q0600  . potassium chloride  40 mEq Oral Q4H  . saccharomyces boulardii  250 mg Oral BID  . sodium chloride flush  5 mL Intracatheter Q8H    HPI: Brad Reed is a 84 y.o. male with hx HTN, OSA, and failure to thrive, decreased PO for last 3 weeks. As an outpt he was eval fo cholelithiasis, found on u/s to have stones but no cholecystitis. He was adm on 2-17 after being told by his son (a Animal nutritionist) to go to the hospital. He was afeb with WBC 15.9 but found on CT scan to have multiple liver abscesses. He underwent perc drain on  2-18. 6.6 L of purulence were removed. He was started on zosyn, his Cx now reveal S anginosis. He has been switched IV PEN.   He has been afebrile in hospital.   Review of Systems: Review of Systems  Constitutional: Negative for chills, fever and weight loss.  Cardiovascular: Positive for leg swelling.  Gastrointestinal: Negative for abdominal pain, constipation and diarrhea.    Genitourinary: Positive for dysuria.  Please see HPI. All other systems reviewed and negative.   Past Medical History:  Diagnosis Date  . Angiodysplasia 02/13/2006  . Arthritis    knees  . Benign prostatic hypertrophy   . Colon polyps   . Diverticulosis of colon   . Hypertension   . Macular degeneration, wet ([place])    Dr Dwain Sarna  . Obesity   . Peripheral neuropathy    feet  . Sleep apnea    DX'd when younger. declined CPAP.    Social History   Tobacco Use  . Smoking status: Former Smoker    Types: Cigarettes    Last attempt to quit: 05/17/1983    Years since quitting: 35.1  . Smokeless tobacco: Never Used  Substance Use Topics  . Alcohol use: Yes    Alcohol/week: 14.0 standard drinks    Types: 14 Shots of liquor per week    Comment: Drinks 2 cocktails /day  . Drug use: No    Family History  Problem Relation Age of Onset  . Cancer Mother   . Obesity Sister   . Diabetes Sister   . Diabetes Maternal Uncle      Medications:  Scheduled: . acidophilus  2 capsule Oral Daily  .  feeding supplement  1 Container Oral TID BM  . heparin injection (subcutaneous)  5,000 Units Subcutaneous Q8H  . multivitamin  1 tablet Oral Daily  . pantoprazole  40 mg Oral Q0600  . potassium chloride  40 mEq Oral Q4H  . saccharomyces boulardii  250 mg Oral BID  . sodium chloride flush  5 mL Intracatheter Q8H    Abtx:  Anti-infectives (From admission, onward)   Start     Dose/Rate Route Frequency Ordered Stop   07/05/18 2000  penicillin G potassium 12 Million Units in dextrose 5 % 500 mL continuous infusion     12 Million Units 41.7 mL/hr over 12 Hours Intravenous Every 12 hours 07/05/18 1510     07/02/18 2300  piperacillin-tazobactam (ZOSYN) IVPB 3.375 g  Status:  Discontinued     3.375 g 12.5 mL/hr over 240 Minutes Intravenous Every 8 hours 07/02/18 1507 07/05/18 1510   07/02/18 1515  piperacillin-tazobactam (ZOSYN) IVPB 3.375 g     3.375 g 100 mL/hr over 30 Minutes  Intravenous  Once 07/02/18 1501 07/02/18 1658        OBJECTIVE: Blood pressure 126/74, pulse 86, temperature 98.2 F (36.8 C), temperature source Oral, resp. rate 18, height '5\' 8"'  (1.727 m), weight (!) 144.7 kg, SpO2 95 %.  Physical Exam Constitutional:      General: He is not in acute distress.    Appearance: He is obese. He is not ill-appearing.  HENT:     Mouth/Throat:     Mouth: Mucous membranes are dry.  Eyes:     Extraocular Movements: Extraocular movements intact.     Pupils: Pupils are equal, round, and reactive to light.  Neck:     Musculoskeletal: Normal range of motion and neck supple.  Cardiovascular:     Rate and Rhythm: Normal rate and regular rhythm.  Pulmonary:     Effort: Pulmonary effort is normal.     Breath sounds: Normal breath sounds.  Abdominal:     General: Bowel sounds are normal. There is no distension.     Palpations: Abdomen is soft.     Tenderness: There is no abdominal tenderness. There is no guarding.  Musculoskeletal:        General: Swelling present.  Neurological:     Mental Status: He is alert and oriented to person, place, and time.     Lab Results Results for orders placed or performed during the hospital encounter of 07/02/18 (from the past 48 hour(s))  Basic metabolic panel     Status: Abnormal   Collection Time: 07/05/18  3:02 AM  Result Value Ref Range   Sodium 137 135 - 145 mmol/L   Potassium 3.3 (L) 3.5 - 5.1 mmol/L   Chloride 100 98 - 111 mmol/L   CO2 28 22 - 32 mmol/L   Glucose, Bld 116 (H) 70 - 99 mg/dL   BUN 12 8 - 23 mg/dL   Creatinine, Ser 1.01 0.61 - 1.24 mg/dL   Calcium 8.3 (L) 8.9 - 10.3 mg/dL   GFR calc non Af Amer >60 >60 mL/min   GFR calc Af Amer >60 >60 mL/min   Anion gap 9 5 - 15    Comment: Performed at [facility], [place] [address] [place] [address]  CBC     Status: Abnormal   Collection Time: 07/05/18  3:02 AM  Result Value Ref Range   WBC 12.5 (H) 4.0 - 10.5 K/uL   RBC 3.75 (L) 4.22  - 5.81  MIL/uL   Hemoglobin 12.4 (L) 13.0 - 17.0 g/dL   HCT 38.3 (L) 39.0 - 52.0 %   MCV 102.1 (H) 80.0 - 100.0 fL   MCH 33.1 26.0 - 34.0 pg   MCHC 32.4 30.0 - 36.0 g/dL   RDW 12.3 11.5 - 15.5 %   Platelets 326 150 - 400 K/uL   nRBC 0.0 0.0 - 0.2 %    Comment: Performed at [facility] [address], [place], [place] [address]  Basic metabolic panel     Status: Abnormal   Collection Time: 07/06/18  2:57 AM  Result Value Ref Range   Sodium 136 135 - 145 mmol/L   Potassium 3.2 (L) 3.5 - 5.1 mmol/L   Chloride 100 98 - 111 mmol/L   CO2 29 22 - 32 mmol/L   Glucose, Bld 131 (H) 70 - 99 mg/dL   BUN 10 8 - 23 mg/dL   Creatinine, Ser 0.97 0.61 - 1.24 mg/dL   Calcium 8.2 (L) 8.9 - 10.3 mg/dL   GFR calc non Af Amer >60 >60 mL/min   GFR calc Af Amer >60 >60 mL/min   Anion gap 7 5 - 15    Comment: Performed at [facility] [address] [place], [place] [address]      Component Value Date/Time   SDES ABSCESS LIVER 07/03/2018 1435   SPECREQUEST Normal 07/03/2018 1435   CULT  07/03/2018 1435    ABUNDANT STREPTOCOCCUS ANGINOSIS CULTURE REINCUBATED FOR BETTER GROWTH Performed at [facility] [address], [place], [place] [address]    REPTSTATUS PENDING 07/03/2018 1435   No results found. Recent Results (from the past 240 hour(s))  Aerobic/Anaerobic Culture (surgical/deep wound)     Status: None (Preliminary result)   Collection Time: 07/03/18  2:35 PM  Result Value Ref Range Status   Specimen Description ABSCESS LIVER  Final   Special Requests Normal  Final   Gram Stain   Final    ABUNDANT WBC PRESENT,BOTH PMN AND MONONUCLEAR ABUNDANT GRAM POSITIVE COCCI IN PAIRS AND CHAINS FEW GRAM NEGATIVE RODS FEW GRAM VARIABLE ROD    Culture   Final    ABUNDANT STREPTOCOCCUS ANGINOSIS CULTURE REINCUBATED FOR BETTER GROWTH Performed at [facility], [place] [address] [place], [place] [address]    Report Status PENDING  Incomplete   Organism ID, Bacteria  STREPTOCOCCUS ANGINOSIS  Final      Susceptibility   Streptococcus anginosis - MIC*    PENICILLIN 0.12 SENSITIVE Sensitive     CEFTRIAXONE 0.5 SENSITIVE Sensitive     ERYTHROMYCIN <=0.12 SENSITIVE Sensitive     LEVOFLOXACIN 0.5 SENSITIVE Sensitive     VANCOMYCIN 0.5 SENSITIVE Sensitive     * ABUNDANT STREPTOCOCCUS ANGINOSIS  C difficile quick scan w PCR reflex     Status: None   Collection Time: 07/04/18  8:50 AM  Result Value Ref Range Status   C Diff antigen NEGATIVE NEGATIVE Final   C Diff toxin NEGATIVE NEGATIVE Final   C Diff interpretation No C. difficile detected.  Final    Comment: Performed at [facility], [place] [address], [place], [place] [address]    Microbiology: Recent Results (from the past 240 hour(s))  Aerobic/Anaerobic Culture (surgical/deep wound)     Status: None (Preliminary result)   Collection Time: 07/03/18  2:35 PM  Result Value Ref Range Status   Specimen Description ABSCESS LIVER  Final   Special Requests Normal  Final  Gram Stain   Final    ABUNDANT WBC PRESENT,BOTH PMN AND MONONUCLEAR ABUNDANT GRAM POSITIVE COCCI IN PAIRS AND CHAINS FEW GRAM NEGATIVE RODS FEW GRAM VARIABLE ROD    Culture   Final    ABUNDANT STREPTOCOCCUS ANGINOSIS CULTURE REINCUBATED FOR BETTER GROWTH Performed at [facility], [place] [address], [place], [place] [address]    Report Status PENDING  Incomplete   Organism ID, Bacteria STREPTOCOCCUS ANGINOSIS  Final      Susceptibility   Streptococcus anginosis - MIC*    PENICILLIN 0.12 SENSITIVE Sensitive     CEFTRIAXONE 0.5 SENSITIVE Sensitive     ERYTHROMYCIN <=0.12 SENSITIVE Sensitive     LEVOFLOXACIN 0.5 SENSITIVE Sensitive     VANCOMYCIN 0.5 SENSITIVE Sensitive     * ABUNDANT STREPTOCOCCUS ANGINOSIS  C difficile quick scan w PCR reflex     Status: None   Collection Time: 07/04/18  8:50 AM  Result Value Ref Range Status   C Diff antigen NEGATIVE NEGATIVE Final   C Diff toxin NEGATIVE NEGATIVE Final   C Diff  interpretation No C. difficile detected.  Final    Comment: Performed at [facility], [place] [address] [place], [place] [address]    Radiographs and labs were personally reviewed by me.     No Known Allergies  OPAT Orders Discharge antibiotics: ceftriaxone 2 g q24h  Duration: 21 days End Date: 06-25-18  [facility] Care Per Protocol: please  Labs weekly while on IV antibiotics: _x_ CBC with differential __ BMP _x_ CMP __ CRP __ ESR __ Vancomycin trough __ CK  __ Please pull PIC at completion of IV antibiotics _x_ Please leave PIC in place until doctor has seen patient or been notified  Fax weekly labs to [phone]  Clinic Follow Up Appt: Creola Corn, MD [facility] for Infectious [place] [phone] 07/06/2018, 3:37 PM

## 2018-07-06 NOTE — Progress Notes (Signed)
Physical Therapy Treatment Patient Details Name: Brad Reed MRN: [id number] DOB: 04/18/1935 Today's Date: 07/06/2018    History of Present Illness Brad Reed is a 84 y.o. male with medical history significant of OSA not on CPAP; macular degeneration; HTN; BPH; and angiodysplasia in 2007 presenting with dehydration. Pt now admitted with hepatic abscess and elevated LFT's     PT Comments    Patient progressing well with therapy today, now ambulating short distances in room with contact guard and use of RW. Pt transfers more fluid and independent toady. Patient eager to return to PLOF, cont to rec CIR at this time as I feel he may continue to progress quickly given today's improvement. DOE 2/4 with 15' gait, SpO2 90%. Pt reports he has been to and from bathroom several times this morning as well. Encourged continued mobility with nursing staff.       Follow Up Recommendations  CIR     Equipment Recommendations  (TBD)    Recommendations for Other Services       Precautions / Restrictions Precautions Precautions: Fall Precaution Comments: Foley, JP drain  Restrictions Weight Bearing Restrictions: No    Mobility  Bed Mobility Overal bed mobility: Needs Assistance Bed Mobility: Supine to Sit     Supine to sit: Min guard     General bed mobility comments: cueing for UE placement/initiation   Transfers Overall transfer level: Needs assistance Equipment used: Rolling walker (2 wheeled) Transfers: Sit to/from [employer] Sit to Stand: Min assist;From elevated surface;+2 safety/equipment Stand pivot transfers: Min assist;From elevated surface       General transfer comment: improved hand placement  Ambulation/Gait Ambulation/Gait assistance: Min guard Gait Distance (Feet): 20 Feet Assistive device: Rolling walker (2 wheeled) Gait Pattern/deviations: Step-to pattern;Step-through pattern     General Gait Details: pt ambulating in room to door and  back 2 laps, fatigues by end of gait, SpO2 90% on RA DOE 2/4. much improvement since yesterday   Stairs             Wheelchair Mobility    Modified Rankin (Stroke Patients Only)       Balance Overall balance assessment: Needs assistance Sitting-balance support: Feet supported;No upper extremity supported Sitting balance-Leahy Scale: Fair     Standing balance support: During functional activity;Bilateral upper extremity supported Standing balance-Leahy Scale: Poor Standing balance comment: Requires contact on RW during dynamic balance, able to briefly maintain static balance without UE support                            Cognition Arousal/Alertness: Awake/alert Behavior During Therapy: WFL for tasks assessed/performed Overall Cognitive Status: Within Functional Limits for tasks assessed                                 General Comments: Pt pleasant and motivated to participate       Exercises      General Comments        Pertinent Vitals/Pain Pain Assessment: No/denies pain    Home Living                      Prior Function            PT Goals (current goals can now be found in the care plan section) Acute Rehab PT Goals Patient Stated Goal: "do what I can to get stronger"  PT Goal Formulation: With patient Time For Goal Achievement: 07/19/18 Potential to Achieve Goals: Good Progress towards PT goals: Progressing toward goals    Frequency    Min 3X/week      PT Plan Current plan remains appropriate    Co-evaluation              AM-PAC PT "6 Clicks" Mobility   Outcome Measure  Help needed turning from your back to your side while in a flat bed without using bedrails?: A Little Help needed moving from lying on your back to sitting on the side of a flat bed without using bedrails?: A Little Help needed moving to and from a bed to a chair (including a wheelchair)?: A Lot Help needed standing up from a  chair using your arms (e.g., wheelchair or bedside chair)?: A Lot Help needed to walk in hospital room?: A Lot Help needed climbing 3-5 steps with a railing? : Total 6 Click Score: 13    End of Session Equipment Utilized During Treatment: Gait belt Activity Tolerance: Patient tolerated treatment well Patient left: in chair;with chair alarm set Nurse Communication: Mobility status PT Visit Diagnosis: Unsteadiness on feet (R26.81);Other abnormalities of gait and mobility (R26.89)     Time: 1007-1030 PT Time Calculation (min) (ACUTE ONLY): 23 min  Charges:  $Gait Training: 8-22 mins $Therapeutic Activity: 8-22 mins                     Etta Grandchild, PT, DPT Acute Rehabilitation Services Pager: [phone] Office: [phone]     Etta Grandchild 07/06/2018, 10:32 AM

## 2018-07-06 NOTE — Progress Notes (Signed)
PHARMACY CONSULT NOTE FOR:  OUTPATIENT  PARENTERAL ANTIBIOTIC THERAPY (OPAT)  Indication: liver abscess Regimen: ceftriaxone 2 g q24h End date: 07/24/18  IV antibiotic discharge orders are pended. To discharging provider:  please sign these orders via discharge navigator,  Select New Orders & click on the button choice - Manage This Unsigned Work.    Isaac Bliss, PharmD, BCPS, BCCCP Clinical Pharmacist [phone]  Please check AMION for all [facility] numbers  07/06/2018 4:04 PM

## 2018-07-06 NOTE — NC FL2 (Signed)
[place] MEDICAID FL2 LEVEL OF CARE SCREENING TOOL     IDENTIFICATION  Patient Name: Brad Reed Birthdate: 1935-04-26 Sex: male Admission Date (Current Location): 07/02/2018  [facility] and [place] Number:  Producer, television/film/video and Address:  [facility], [address], [place], [place] [address]      Provider Number: [id number]  Attending Physician Name and Address:  Meredeth Ide, MD  Relative Name and Phone Number:       Current Level of Care: Hospital Recommended Level of Care: Skilled Nursing Facility Prior Approval Number:    Date Approved/Denied:   PASRR Number: [id number]  Discharge Plan: SNF    Current Diagnoses: Patient Active Problem List   Diagnosis Date Noted  . Pressure injury of skin 07/03/2018  . Abnormal LFTs 07/02/2018  . Hepatic abscess 07/02/2018  . RUQ abdominal pain 06/25/2018  . Arrhythmia 09/14/2016  . Impaired fasting glucose 08/26/2014  . Advance directive discussed with patient 08/26/2014  . Peripheral neuropathy   . Pain in limb 02/25/2013  . Dermatophytosis of nail 02/25/2013  . Routine general medical examination at a health care facility 08/16/2011  . Macular degeneration, wet (HCC)   . Obesity, Class III, BMI 40-49.9 (morbid obesity) (HCC) 08/03/2010  . BPH with obstruction/lower urinary tract symptoms 12/24/2007  . Essential hypertension, benign 12/13/2006  . DIVERTICULOSIS, COLON 12/13/2006    Orientation RESPIRATION BLADDER Height & Weight     Self, Situation, Place  Normal Incontinent, Indwelling catheter(placed 07/03/18) Weight: (!) 319 lb 0.1 oz (144.7 kg) Height:  5\' 8"  (172.7 cm)  BEHAVIORAL SYMPTOMS/MOOD NEUROLOGICAL BOWEL NUTRITION STATUS      Continent Diet(heart healthy, thin lquids)  AMBULATORY STATUS COMMUNICATION OF NEEDS Skin   Limited Assist Verbally PU Stage and Appropriate Care   PU Stage 2 Dressing: (Sacrum)                   Personal Care Assistance Level of Assistance   Dressing, Feeding, Bathing Bathing Assistance: Limited assistance Feeding assistance: Independent Dressing Assistance: Limited assistance     Functional Limitations Info  Sight, Hearing, Speech Sight Info: Adequate Hearing Info: Adequate Speech Info: Adequate    SPECIAL CARE FACTORS FREQUENCY  PT (By licensed PT), OT (By licensed OT)     PT Frequency: 3x OT Frequency: 3x            Contractures Contractures Info: Not present    Additional Factors Info  Code Status, Allergies Code Status Info: DNR Allergies Info: No known allergies           Current Medications (07/06/2018):  This is the current hospital active medication list Current Facility-Administered Medications  Medication Dose Route Frequency Provider Last Rate Last Dose  . acetaminophen (TYLENOL) tablet 650 mg  650 mg Oral Q6H PRN Jonah Blue, MD       Or  . acetaminophen (TYLENOL) suppository 650 mg  650 mg Rectal Q6H PRN Jonah Blue, MD      . acidophilus (RISAQUAD) capsule 2 capsule  2 capsule Oral Daily Dianah Field, PA-C   2 capsule at 07/06/18 2355  . feeding supplement (BOOST / RESOURCE BREEZE) liquid 1 Container  1 Container Oral TID BM Jonah Blue, MD   1 Container at 07/06/18 1109  . heparin injection 5,000 Units  5,000 Units Subcutaneous Q8H Elgergawy, Leana Roe, MD   5,000 Units at 07/06/18 0519  . hydrALAZINE (APRESOLINE) tablet 25 mg  25 mg Oral Q6H PRN Mauro Kaufmann  S, MD      . lactated ringers infusion   Intravenous Continuous Jonah Blue, MD   Stopped at 07/03/18 1500  . loperamide (IMODIUM) capsule 2 mg  2 mg Oral Q8H PRN Meredeth Ide, MD   2 mg at 07/04/18 1804  . morphine 2 MG/ML injection 2 mg  2 mg Intravenous Q2H PRN Jonah Blue, MD   2 mg at 07/04/18 2005  . multivitamin (PROSIGHT) tablet 1 tablet  1 tablet Oral Daily Jonah Blue, MD   1 tablet at 07/06/18 [phone]  . ondansetron (ZOFRAN) tablet 4 mg  4 mg Oral Q6H PRN Jonah Blue, MD       Or  . ondansetron  [facility]) injection 4 mg  4 mg Intravenous Q6H PRN Jonah Blue, MD      . pantoprazole (PROTONIX) EC tablet 40 mg  40 mg Oral Q0600 Dianah Field, PA-C   40 mg at 07/06/18 7169  . penicillin G potassium 12 Million Units in dextrose 5 % 500 mL continuous infusion  12 Million Units Intravenous Q12H Meredeth Ide, MD 41.7 mL/hr at 07/06/18 0916 12 Million Units at 07/06/18 0916  . saccharomyces boulardii (FLORASTOR) capsule 250 mg  250 mg Oral BID Dianah Field, PA-C   250 mg at 07/06/18 6789  . sodium chloride flush (NS) 0.9 % injection 5 mL  5 mL Intracatheter Q8H Berdine Dance, MD   5 mL at 07/06/18 3810     Discharge Medications: Please see discharge summary for a list of discharge medications.  Relevant Imaging Results:  Relevant Lab Results:   Additional Information SSN: [id number]  Maree Krabbe, LCSW

## 2018-07-06 NOTE — Clinical Social Work Note (Signed)
Clinical Social Work Assessment  Patient Details  Name: Brad Reed MRN: [id number] Date of Birth: 03/20/1935  Date of referral:  07/06/18               Reason for consult:  Facility Placement                Permission sought to share information with:  Facility Medical sales representative, Family Supports Permission granted to share information::  Yes, Verbal Permission Granted  Name::     Dr. Renee Ramus  Agency::  SNFs  Relationship::  Son  Contact Information:  [phone]  Housing/Transportation Living arrangements for the past 2 months:  Single Family Home Source of Information:  Patient, Adult Children Patient Interpreter Needed:  None Criminal Activity/Legal Involvement Pertinent to Current Situation/Hospitalization:  No - Comment as needed Significant Relationships:  Adult Children Lives with:  Self Do you feel safe going back to the place where you live?  No Need for family participation in patient care:  Yes (Comment)  Care giving concerns:  CSW received consult for possible SNF placement at time of discharge. CSW spoke with patient and son regarding PT recommendation of SNF placement at time of discharge. Patient reported that patient's son is currently unable to care for patient given patient's current physical needs and fall risk. Patient expressed understanding of PT recommendation and is agreeable to SNF placement at time of discharge. CSW to continue to follow and assist with discharge planning needs.   Social Worker assessment / plan:  CSW spoke with patient and son concerning possibility of rehab at [facility] before returning home.  Employment status:  Retired Database administrator PT Recommendations:  Skilled Holiday representative, Inpatient Rehab Consult Information / Referral to community resources:  Skilled Nursing Facility  Patient/Family's Response to care:  Patient recognizes need for rehab before returning home and is agreeable to a SNF in [place]. Patient's son reported preference for [facility] since he has had patients go there before. CSW explained insurance authorization process and will await auth.   Patient/Family's Understanding of and Emotional Response to Diagnosis, Current Treatment, and Prognosis:  Patient/family is realistic regarding therapy needs and expressed being hopeful for SNF placement. Patient and son expressed understanding of CSW role and discharge process as well as medical condition. CSW directed them to RN or MD for medical questions. No questions/concerns about plan or treatment.    Emotional Assessment Appearance:  Appears stated age Attitude/Demeanor/Rapport:  Gracious Affect (typically observed):  Accepting, Appropriate Orientation:  Oriented to Self, Oriented to Place, Oriented to  Time, Oriented to Situation Alcohol / Substance use:  Not Applicable Psych involvement (Current and /or in the community):  No (Comment)  Discharge Needs  Concerns to be addressed:  Care Coordination Readmission within the last 30 days:  No Current discharge risk:  Dependent with Mobility Barriers to Discharge:  English as a second language teacher, Continued Medical Work up   [employer], LCSW 07/06/2018, 3:21 PM

## 2018-07-07 ENCOUNTER — Inpatient Hospital Stay: Payer: Self-pay

## 2018-07-07 LAB — BASIC METABOLIC PANEL
Anion gap: 9 (ref 5–15)
BUN: 7 mg/dL — ABNORMAL LOW (ref 8–23)
CHLORIDE: 102 mmol/L (ref 98–111)
CO2: 26 mmol/L (ref 22–32)
Calcium: 8.3 mg/dL — ABNORMAL LOW (ref 8.9–10.3)
Creatinine, Ser: 0.85 mg/dL (ref 0.61–1.24)
GFR calc Af Amer: >60 mL/min (ref >60)
GFR calc non Af Amer: >60 mL/min (ref >60)
Glucose, Bld: 137 mg/dL — ABNORMAL HIGH (ref 70–99)
Potassium: 3.9 mmol/L (ref 3.5–5.1)
Sodium: 137 mmol/L (ref 135–145)

## 2018-07-07 LAB — AEROBIC/ANAEROBIC CULTURE W GRAM STAIN (SURGICAL/DEEP WOUND): Special Requests: NORMAL

## 2018-07-07 LAB — GLUCOSE, CAPILLARY: Glucose-Capillary: 109 mg/dL — ABNORMAL HIGH (ref 70–99)

## 2018-07-07 MED ORDER — SODIUM CHLORIDE 0.9% FLUSH: 10.0000 mL | INTRAVENOUS | Status: DC | PRN

## 2018-07-07 NOTE — Progress Notes (Addendum)
[facility]  PROGRESS NOTE  Brad Reed MHD:[id number] DOB: 10-22-1934 DOA: 07/02/2018 PCP: Karie Schwalbe, MD   Brief HPI:   84 year old male with history of obstructive sleep apnea not on CPAP, macular degeneration, hypertension, BPH, angiodysplasia in 2007 came with dehydration, generalized weakness.  Patient also has poor p.o. intake for past 3 weeks.  Complains of nausea but no vomiting.  In the ED initially was started on IV Zosyn for possible cholangitis for right upper quadrant pain.  CT abdomen showed liver abscess.  Patient underwent drain placement for the liver abscess.    Subjective   Patient seen and examined, denies abdominal pain.  Started on IV ceftriaxone per ID.   Assessment/Plan:     1. Liver abscess-status post aspiration and drain placement. Culture from the abscess is growing Streptococcus angiosis.  Was started on  IV Zosyn.  Final culture and sensitivity is growing Streptococcus angiosis.  Antibiotics changed to penicillin.  ID was consulted and recommended IV ceftriaxone for 3 weeks and then repeat CT scan to determine the length of therapy.  Patient can follow-up with drain clinic in ID clinic.Will order PICC line today.  2. Diarrhea-C. difficile PCR obtained  is negative.  Continue PRN Imodium.  3. Hypertension-Blood pressure is stable,Dyazide is currently on hold.  Hydralazine PRN  4. Impaired fasting glucose-hemoglobin A1c is 5.2.  5. Hypokalemia- Replete    CBG: Recent Labs  Lab 07/06/18 2133 07/07/18 0813  GLUCAP 104* 109*    CBC: Recent Labs  Lab 07/02/18 1011 07/03/18 0326 07/04/18 0314 07/05/18 0302  WBC 15.9* 14.0* 15.3* 12.5*  HGB 14.8 13.4 12.6* 12.4*  HCT 46.3 42.1 40.5 38.3*  MCV 100.9* 101.2* 102.8* 102.1*  PLT 318 276 299 326    Basic Metabolic Panel: Recent Labs  Lab 07/03/18 0326 07/04/18 0314 07/05/18 0302 07/06/18 0257 07/06/18 1514 07/07/18 0251  NA 138 139 137 136  --  137  K 3.1* 3.5 3.3* 3.2*   --  3.9  CL 98 100 100 100  --  102  CO2 33* 29 28 29   --  26  GLUCOSE 117* 119* 116* 131*  --  137*  BUN 10 12 12 10   --  7*  CREATININE 1.07 1.37* 1.01 0.97  --  0.85  CALCIUM 8.3* 8.3* 8.3* 8.2*  --  8.3*  MG  --   --   --   --  2.1  --      DVT prophylaxis: SCds  Code Status: DNR  Family Communication: No family at bedside  Disposition Plan: Skilled nursing facility     Consultants:  GI  Procedures:  Drain placement for liver abscess per IR   Antibiotics:   Anti-infectives (From admission, onward)   Start     Dose/Rate Route Frequency Ordered Stop   07/06/18 2130  cefTRIAXone (ROCEPHIN) 1 g in sodium chloride 0.9 % 100 mL IVPB     1 g 200 mL/hr over 30 Minutes Intravenous  Once 07/06/18 2125 07/06/18 2215   07/06/18 1700  cefTRIAXone (ROCEPHIN) 2 g in sodium chloride 0.9 % 100 mL IVPB     2 g 200 mL/hr over 30 Minutes Intravenous Every 24 hours 07/06/18 1550     07/05/18 2000  penicillin G potassium 12 Million Units in dextrose 5 % 500 mL continuous infusion  Status:  Discontinued     12 Million Units 41.7 mL/hr over 12 Hours Intravenous Every 12 hours 07/05/18 1510 07/06/18 1550   07/02/18 2300  piperacillin-tazobactam (ZOSYN) IVPB 3.375 g  Status:  Discontinued     3.375 g 12.5 mL/hr over 240 Minutes Intravenous Every 8 hours 07/02/18 1507 07/05/18 1510   07/02/18 1515  piperacillin-tazobactam (ZOSYN) IVPB 3.375 g     3.375 g 100 mL/hr over 30 Minutes Intravenous  Once 07/02/18 1501 07/02/18 1658       Objective   Vitals:   07/06/18 1643 07/06/18 2300 07/07/18 0500 07/07/18 0813  BP: (!) 143/86 140/68  134/69  Pulse: 89 92  89  Resp: 18 (!) 22  19  Temp: 98.3 F (36.8 C) 98.2 F (36.8 C)  98.4 F (36.9 C)  TempSrc: Oral Oral  Oral  SpO2: 90% 90%  90%  Weight:   (!) 146.1 kg   Height:        Intake/Output Summary (Last 24 hours) at 07/07/2018 1007 Last data filed at 07/07/2018 0500 Gross per 24 hour  Intake 300.18 ml  Output 1992 ml  Net  -1691.82 ml   Filed Weights   07/02/18 2029 07/05/18 0530 07/07/18 0500  Weight: 135.9 kg (!) 144.7 kg (!) 146.1 kg     Physical Examination:   General: Appears in no acute distress  Cardiovascular: S1-S2, regular, no murmur auscultated  Respiratory: Clear to auscultation bilaterally  Abdomen: Abdomen is soft, distended, nontender palpation, drain in place  Extremities: No edema of the lower extremities  Neurologic: Alert, oriented x3, no focal deficit noted        Data Reviewed: I have personally reviewed following labs and imaging studies   Recent Results (from the past 240 hour(s))  Aerobic/Anaerobic Culture (surgical/deep wound)     Status: None (Preliminary result)   Collection Time: 07/03/18  2:35 PM  Result Value Ref Range Status   Specimen Description ABSCESS LIVER  Final   Special Requests Normal  Final   Gram Stain   Final    ABUNDANT WBC PRESENT,BOTH PMN AND MONONUCLEAR ABUNDANT GRAM POSITIVE COCCI IN PAIRS AND CHAINS FEW GRAM NEGATIVE RODS FEW GRAM VARIABLE ROD    Culture   Final    ABUNDANT STREPTOCOCCUS ANGINOSIS CULTURE REINCUBATED FOR BETTER GROWTH Performed at [facility], [address] [place], [place] [address]    Report Status PENDING  Incomplete   Organism ID, Bacteria STREPTOCOCCUS ANGINOSIS  Final      Susceptibility   Streptococcus anginosis - MIC*    PENICILLIN 0.12 SENSITIVE Sensitive     CEFTRIAXONE 0.5 SENSITIVE Sensitive     ERYTHROMYCIN <=0.12 SENSITIVE Sensitive     LEVOFLOXACIN 0.5 SENSITIVE Sensitive     VANCOMYCIN 0.5 SENSITIVE Sensitive     * ABUNDANT STREPTOCOCCUS ANGINOSIS  C difficile quick scan w PCR reflex     Status: None   Collection Time: 07/04/18  8:50 AM  Result Value Ref Range Status   C Diff antigen NEGATIVE NEGATIVE Final   C Diff toxin NEGATIVE NEGATIVE Final   C Diff interpretation No C. difficile detected.  Final    Comment: Performed at [facility], [address], [place], [place]  [address]     Liver Function Tests: Recent Labs  Lab 07/02/18 1011 07/03/18 0326 07/04/18 0314  AST 45* 40 50*  ALT 49* 43 47*  ALKPHOS 149* 119 121  BILITOT 1.4* 1.2 1.5*  PROT 6.8 5.7* 5.8*  ALBUMIN 2.3* 2.0* 1.9*   Recent Labs  Lab 07/02/18 1011  LIPASE 35      Studies: [place] [employer]  Result Date:  07/07/2018 If Site Rite image not attached, placement could not be confirmed due to current cardiac rhythm.   Scheduled Meds: . acidophilus  2 capsule Oral Daily  . feeding supplement  1 Container Oral TID BM  . heparin injection (subcutaneous)  5,000 Units Subcutaneous Q8H  . multivitamin  1 tablet Oral Daily  . pantoprazole  40 mg Oral Q0600  . saccharomyces boulardii  250 mg Oral BID  . sodium chloride flush  5 mL Intracatheter Q8H    Admission status: Inpatient: Based on patients clinical presentation and evaluation of above clinical data, I have made determination that patient meets Inpatient criteria at this time.  Time spent: 20 min  Meredeth Ide   [facility] Pager [phone]. If 7PM-7AM, please contact night-coverage at [email or web address], Office  [phone]  password [email or web address]  07/07/2018, 10:07 AM  LOS: 5 days

## 2018-07-07 NOTE — Progress Notes (Signed)
CSW received insurance authorization. Patient is able to go to [facility] once he is medically stable.   CSW will continue to follow.   Drucilla Schmidt, MSW, LCSW-A Clinical Social Worker [employer]

## 2018-07-07 NOTE — Progress Notes (Signed)
Peripherally Inserted Central Catheter/Midline Placement  The IV Nurse has discussed with the patient and/or persons authorized to consent for the patient, the purpose of this procedure and the potential benefits and risks involved with this procedure.  The benefits include less needle sticks, lab draws from the catheter, and the patient may be discharged home with the catheter. Risks include, but not limited to, infection, bleeding, blood clot (thrombus formation), and puncture of an artery; nerve damage and irregular heartbeat and possibility to perform a PICC exchange if needed/ordered by physician.  Alternatives to this procedure were also discussed.  B[employer] PICC patient education guide, fact sheet on infection prevention and patient information card has been provided to patient /or left at bedside.    PICC/Midline Placement Documentation  PICC Single Lumen 07/07/18 PICC Right Cephalic 48 cm 0 cm (Active)  Indication for Insertion or Continuance of Line Home intravenous therapies (PICC only) 07/07/2018 10:00 AM  Exposed Catheter (cm) 0 cm 07/07/2018 10:00 AM  Site Assessment Clean;Dry;Intact 07/07/2018 10:00 AM  Line Status Flushed;Blood return noted;Saline locked 07/07/2018 10:00 AM  Dressing Type Transparent 07/07/2018 10:00 AM  Dressing Status Clean;Dry;Intact;Antimicrobial disc in place 07/07/2018 10:00 AM  Line Care Connections checked and tightened 07/07/2018 10:00 AM  Line Adjustment (NICU/IV Team Only) No 07/07/2018 10:00 AM  Dressing Intervention New dressing 07/07/2018 10:00 AM  Dressing Change Due 07/14/18 07/07/2018 10:00 AM       Edwin Cap 07/07/2018, 10:57 AM

## 2018-07-07 NOTE — Progress Notes (Signed)
Physical Therapy Treatment Patient Details Name: Brad Reed MRN: [id number] DOB: Feb 20, 1935 Today's Date: 07/07/2018    History of Present Illness Brad Reed is a 84 y.o. male with medical history significant of OSA not on CPAP; macular degeneration; HTN; BPH; and angiodysplasia in 2007 presenting with dehydration. Pt now admitted with hepatic abscess and elevated LFT's     PT Comments    Patient with increased independence standing today, ambulating short distances with stand by assist. Session limited by sudden urge to utilize bathroom. Cont to rec post acute rehab. Noted plan for SNF, this would be appropriate.     Follow Up Recommendations  CIR     Equipment Recommendations       Recommendations for Other Services       Precautions / Restrictions Precautions Precautions: Fall Precaution Comments: Foley, JP drain  Restrictions Weight Bearing Restrictions: No    Mobility  Bed Mobility Overal bed mobility: Needs Assistance Bed Mobility: Supine to Sit     Supine to sit: Min guard     General bed mobility comments: cueing for UE placement/initiation   Transfers Overall transfer level: Needs assistance Equipment used: Rolling walker (2 wheeled) Transfers: Sit to/from [employer] Sit to Stand: Min assist;From elevated surface;+2 safety/equipment Stand pivot transfers: Min assist;From elevated surface       General transfer comment: improved hand placement  Ambulation/Gait Ambulation/Gait assistance: Min guard Gait Distance (Feet): 10 Feet Assistive device: Rolling walker (2 wheeled) Gait Pattern/deviations: Step-to pattern;Step-through pattern     General Gait Details: pt ambulating with urgent need to use BR, session limited    Stairs             Wheelchair Mobility    Modified Rankin (Stroke Patients Only)       Balance Overall balance assessment: Needs assistance Sitting-balance support: Feet supported;No upper  extremity supported Sitting balance-Leahy Scale: Fair     Standing balance support: During functional activity;Bilateral upper extremity supported Standing balance-Leahy Scale: Poor Standing balance comment: Requires contact on RW during dynamic balance, able to briefly maintain static balance without UE support                            Cognition Arousal/Alertness: Awake/alert Behavior During Therapy: WFL for tasks assessed/performed Overall Cognitive Status: Within Functional Limits for tasks assessed                                 General Comments: Pt pleasant and motivated to participate       Exercises      General Comments        Pertinent Vitals/Pain Pain Assessment: No/denies pain    Home Living Family/patient expects to be discharged to:: Private residence                    Prior Function            PT Goals (current goals can now be found in the care plan section) Acute Rehab PT Goals PT Goal Formulation: With patient Time For Goal Achievement: 07/19/18 Potential to Achieve Goals: Good Progress towards PT goals: Progressing toward goals    Frequency    Min 3X/week      PT Plan Current plan remains appropriate    Co-evaluation              AM-PAC PT "6 Clicks"  Mobility   Outcome Measure  Help needed turning from your back to your side while in a flat bed without using bedrails?: A Little Help needed moving from lying on your back to sitting on the side of a flat bed without using bedrails?: A Little Help needed moving to and from a bed to a chair (including a wheelchair)?: A Lot Help needed standing up from a chair using your arms (e.g., wheelchair or bedside chair)?: A Lot Help needed to walk in hospital room?: A Lot Help needed climbing 3-5 steps with a railing? : Total 6 Click Score: 13    End of Session Equipment Utilized During Treatment: Gait belt Activity Tolerance: Patient tolerated treatment  well Patient left: in chair;with chair alarm set Nurse Communication: Mobility status PT Visit Diagnosis: Unsteadiness on feet (R26.81);Other abnormalities of gait and mobility (R26.89)     Time: 1411-1430 PT Time Calculation (min) (ACUTE ONLY): 19 min  Charges:  $Gait Training: 8-22 mins                     Etta Grandchild, PT, DPT Acute Rehabilitation Services Pager: [phone] Office: [phone]     Etta Grandchild 07/07/2018, 3:32 PM

## 2018-07-08 DIAGNOSIS — R2689 Other abnormalities of gait and mobility: Secondary | ICD-10-CM | POA: Diagnosis not present

## 2018-07-08 DIAGNOSIS — R52 Pain, unspecified: Secondary | ICD-10-CM | POA: Diagnosis not present

## 2018-07-08 DIAGNOSIS — L89152 Pressure ulcer of sacral region, stage 2: Secondary | ICD-10-CM | POA: Diagnosis not present

## 2018-07-08 DIAGNOSIS — R21 Rash and other nonspecific skin eruption: Secondary | ICD-10-CM | POA: Diagnosis not present

## 2018-07-08 DIAGNOSIS — M2550 Pain in unspecified joint: Secondary | ICD-10-CM | POA: Diagnosis not present

## 2018-07-08 DIAGNOSIS — R488 Other symbolic dysfunctions: Secondary | ICD-10-CM | POA: Diagnosis not present

## 2018-07-08 DIAGNOSIS — R0902 Hypoxemia: Secondary | ICD-10-CM | POA: Diagnosis not present

## 2018-07-08 DIAGNOSIS — L270 Generalized skin eruption due to drugs and medicaments taken internally: Secondary | ICD-10-CM | POA: Diagnosis not present

## 2018-07-08 DIAGNOSIS — I10 Essential (primary) hypertension: Secondary | ICD-10-CM | POA: Diagnosis not present

## 2018-07-08 DIAGNOSIS — R1084 Generalized abdominal pain: Secondary | ICD-10-CM | POA: Diagnosis not present

## 2018-07-08 DIAGNOSIS — Z6841 Body Mass Index (BMI) 40.0 and over, adult: Secondary | ICD-10-CM | POA: Diagnosis not present

## 2018-07-08 DIAGNOSIS — Z7401 Bed confinement status: Secondary | ICD-10-CM | POA: Diagnosis not present

## 2018-07-08 DIAGNOSIS — M6281 Muscle weakness (generalized): Secondary | ICD-10-CM | POA: Diagnosis not present

## 2018-07-08 DIAGNOSIS — R7301 Impaired fasting glucose: Secondary | ICD-10-CM | POA: Diagnosis not present

## 2018-07-08 DIAGNOSIS — K7689 Other specified diseases of liver: Secondary | ICD-10-CM | POA: Diagnosis not present

## 2018-07-08 DIAGNOSIS — R945 Abnormal results of liver function studies: Secondary | ICD-10-CM | POA: Diagnosis not present

## 2018-07-08 DIAGNOSIS — K750 Abscess of liver: Secondary | ICD-10-CM | POA: Diagnosis not present

## 2018-07-08 DIAGNOSIS — R627 Adult failure to thrive: Secondary | ICD-10-CM | POA: Diagnosis not present

## 2018-07-08 MED ORDER — SACCHAROMYCES BOULARDII 250 MG PO CAPS: 250.0000 mg | ORAL_CAPSULE | Freq: Two times a day (BID) | ORAL | Status: DC

## 2018-07-08 MED ORDER — ONDANSETRON HCL 4 MG PO TABS: 4.0000 mg | ORAL_TABLET | Freq: Four times a day (QID) | ORAL | 0 refills | Status: DC | PRN

## 2018-07-08 MED ORDER — RISAQUAD PO CAPS: 2.0000 | ORAL_CAPSULE | Freq: Every day | ORAL | Status: DC

## 2018-07-08 MED ORDER — ACETAMINOPHEN 325 MG PO TABS: 650.0000 mg | ORAL_TABLET | Freq: Four times a day (QID) | ORAL | Status: DC | PRN

## 2018-07-08 MED ORDER — CEFTRIAXONE IV (FOR PTA / DISCHARGE USE ONLY): 2.0000 g | INTRAVENOUS | 0 refills | Status: AC

## 2018-07-08 NOTE — Progress Notes (Signed)
Pt dc'd to [facility] via [facility]. Report called to RN. Receiving facility aware of plan of care. Also aware that pt is being dc'd with PICC line and foley catheter. Foley care was completed this morning and PICC dressing is clean, dry, and intact upon discharge.

## 2018-07-08 NOTE — Discharge Summary (Signed)
Physician Discharge Summary  Brad Reed SHF:[id number] DOB: Jan 19, 1935 DOA: 07/02/2018  PCP: Venia Carbon, MD  Admit date: 07/02/2018 Discharge date: 07/08/2018  Time spent: 50 minutes  Recommendations for Outpatient Follow-up:  1. Continue IV ceftriaxone for 18 more days.  Stop date after 07/24/2018 2. Will need CT of the abdomen pelvis after completion of antibiotics. 3. Follow-up Dr. Johnnye Sima in [place] clinic 4. Follow-up drain clinic, IR will call to make an appointment   Discharge Diagnoses:  Principal Problem:   Hepatic abscess Active Problems:   Essential hypertension, benign   Obesity, Class III, BMI 40-49.9 (morbid obesity) (HCC)   Impaired fasting glucose   Abnormal LFTs   Pressure injury of skin   Discharge Condition: Stable  Diet recommendation: Heart healthy diet  Filed Weights   07/05/18 0530 07/07/18 0500 07/08/18 0449  Weight: (!) 144.7 kg (!) 146.1 kg (!) 142.3 kg    History of present illness:   84 year old male with history of obstructive sleep apnea not on CPAP, macular degeneration, hypertension, BPH, angiodysplasia in 2007 came with dehydration, generalized weakness.  Patient also has poor p.o. intake for past 3 weeks.  Complains of nausea but no vomiting.  In the ED initially was started on IV Zosyn for possible cholangitis for right upper quadrant pain.  CT abdomen showed liver abscess.  Patient underwent drain placement for the liver abscess.  Hospital Course:   1. Liver abscess-status post aspiration and drain placement. Culture from the abscess is growing Streptococcus angiosis.  Was started on  IV Zosyn.  Final culture and sensitivity is growing Streptococcus angiosis.  Antibiotics changed to penicillin.  ID was consulted and recommended IV ceftriaxone for 3 weeks and then repeat CT scan to determine the length of therapy.  Patient can follow-up with drain clinic in ID clinic.PICC line placed.  2. Diarrhea-C. difficile PCR obtained  is negative.   Continue PRN Imodium.  3. Hypertension-Blood pressure is stable, continue Dyazide   4. Impaired fasting glucose-hemoglobin A1c is 5.2.  5. Hypokalemia- Replete  Patient to be discharged to skilled nursing facility for rehab.  Procedures:  Aspiration of liver abscess with drain placement  Consultations:  Infectious disease  IR  Discharge Exam: Vitals:   07/08/18 0007 07/08/18 0812  BP: 136/74 (!) 142/81  Pulse: 65 88  Resp: 20 16  Temp: 98.5 F (36.9 C) 97.8 F (36.6 C)  SpO2: 91% 93%    General: Appears in no acute distress Cardiovascular: S1-S2, regular Respiratory: Clear to auscultation bilaterally  Discharge Instructions   Discharge Instructions    Diet - low sodium heart healthy   Complete by:  As directed    Home infusion instructions [facility] May follow [place] Dosing Protocol; May administer Cathflo as needed to maintain patency of vascular access device.; Flushing of vascular access device: per [facility] Protocol: 0.9% NaCl pre/post medica...   Complete by:  As directed    Instructions:  May follow [place] Dosing Protocol   Instructions:  May administer Cathflo as needed to maintain patency of vascular access device.   Instructions:  Flushing of vascular access device: per [facility] Protocol: 0.9% NaCl pre/post medication administration and prn patency; Heparin 100 u/ml, 79m for implanted ports and Heparin 10u/ml, 547mfor all other central venous catheters.   Instructions:  May follow [facility] Anaphylaxis Protocol for First Dose Administration in the home: 0.9% NaCl at 25-50 ml/hr to maintain IV access for protocol meds. Epinephrine 0.3 ml IV/IM PRN and Benadryl 25-50  IV/IM PRN s/s of anaphylaxis.   Instructions:  [facility] Coordinator (RN) to assist per patient IV care needs in the home PRN.   Increase activity slowly   Complete by:  As directed      Allergies as of 07/08/2018   No Known Allergies     Medication List    STOP  taking these medications   Ibuprofen 200 MG Caps     TAKE these medications   acetaminophen 325 MG tablet Commonly known as:  TYLENOL Take 2 tablets (650 mg total) by mouth every 6 (six) hours as needed for mild pain or moderate pain (or Fever >/= 101).   acidophilus Caps capsule Take 2 capsules by mouth daily.   cefTRIAXone  IVPB Commonly known as:  ROCEPHIN Inject 2 g into the vein daily for 18 days. Indication:  Liver abscess Last Day of Therapy:  07/24/18 Labs - Once weekly:  CBC/D and BMP, Labs - Every other week:  ESR and CRP   multivitamin-lutein Caps capsule Take 1 capsule by mouth daily.   omeprazole 20 MG capsule Commonly known as:  PRILOSEC Take 1 capsule (20 mg total) by mouth 2 (two) times daily before a meal.   ondansetron 4 MG tablet Commonly known as:  ZOFRAN Take 1 tablet (4 mg total) by mouth every 6 (six) hours as needed for nausea.   potassium chloride 10 MEQ tablet Commonly known as:  K-DUR TAKE 1 TABLET (10 MEQ TOTAL) BY MOUTH 2 (TWO) TIMES DAILY. What changed:    how much to take  how to take this  when to take this  additional instructions   saccharomyces boulardii 250 MG capsule Commonly known as:  FLORASTOR Take 1 capsule (250 mg total) by mouth 2 (two) times daily.   triamterene-hydrochlorothiazide 37.5-25 MG capsule Commonly known as:  DYAZIDE TAKE ONE CAPSULE BY MOUTH EVERY MORNING What changed:    how much to take  how to take this  when to take this  additional instructions            Home Infusion Instuctions  (From admission, onward)         Start     Ordered   07/08/18 0000  Home infusion instructions [facility] May follow [place] Dosing Protocol; May administer Cathflo as needed to maintain patency of vascular access device.; Flushing of vascular access device: per [facility] Protocol: 0.9% NaCl pre/post medica...    Question Answer Comment  Instructions May follow [place] Dosing Protocol    Instructions May administer Cathflo as needed to maintain patency of vascular access device.   Instructions Flushing of vascular access device: per [facility] Protocol: 0.9% NaCl pre/post medication administration and prn patency; Heparin 100 u/ml, 73m for implanted ports and Heparin 10u/ml, 585mfor all other central venous catheters.   Instructions May follow [facility] Anaphylaxis Protocol for First Dose Administration in the home: 0.9% NaCl at 25-50 ml/hr to maintain IV access for protocol meds. Epinephrine 0.3 ml IV/IM PRN and Benadryl 25-50 IV/IM PRN s/s of anaphylaxis.   Instructions [facility] Infusion Coordinator (RN) to assist per patient IV care needs in the home PRN.      07/08/18 0837         No Known Allergies  Contact information for follow-up providers    ShGreggory KeenMD Follow up in 12 day(s).   Specialties:  Interventional Radiology, Radiology Why:  pt will hear from IR scheduler for OP IR Clinic follow  up appt with Dr Annamaria Boots-- call [phone] if questions Contact information: [address] [place] [address] [phone]            Contact information for after-discharge care    Destination    [facility] Preferred SNF .   Service:  Skilled Nursing Contact information: [address] [place] [phone]                   The results of significant diagnostics from this hospitalization (including imaging, microbiology, ancillary and laboratory) are listed below for reference.    Significant Diagnostic Studies: Dg Chest 2 View  Result Date: 07/02/2018 CLINICAL DATA:  84 year old male with dehydration EXAM: CHEST - 2 VIEW COMPARISON:  None. FINDINGS: Cardiomediastinal silhouette demonstrates cardiomegaly. Low lung volumes. No pneumothorax. No confluent airspace disease. No pleural effusion. Mildly coarsened interstitial markings. No interlobular septal thickening or evidence of central vascular  congestion. No displaced fracture.  Degenerative changes of the spine. IMPRESSION: Low lung volumes with likely chronic changes and no evidence of acute cardiopulmonary disease Electronically Signed   By: Corrie Mckusick D.O.   On: 07/02/2018 14:00   Ct Abdomen Pelvis W Contrast  Result Date: 07/02/2018 CLINICAL DATA:  Evaluate dehydration, suspected cholecystitis EXAM: CT ABDOMEN AND PELVIS WITH CONTRAST TECHNIQUE: Multidetector CT imaging of the abdomen and pelvis was performed using the standard protocol following bolus administration of intravenous contrast. CONTRAST:  155m OMNIPAQUE IOHEXOL 300 MG/ML  SOLN COMPARISON:  Abdominal ultrasound, 07/02/2018 FINDINGS: Lower chest: No acute abnormality. Hepatobiliary: There are large air and fluid containing lesions of the left lobe of the liver, the larger measuring at least 14.7 cm and a smaller adjacent lesion measuring 3.8 cm (series 3, image 14). No gallstones, gallbladder wall thickening, or biliary dilatation. Pancreas: Unremarkable. No pancreatic ductal dilatation or surrounding inflammatory changes. Spleen: Normal in size without focal abnormality. Adrenals/Urinary Tract: Adrenal glands are unremarkable. Kidneys are normal, without renal calculi, focal lesion, or hydronephrosis. Thickening of the urinary bladder, likely due to chronic outlet obstruction. Stomach/Bowel: Stomach is within normal limits. Appendix not clearly visualized. No evidence of bowel wall thickening, distention, or inflammatory changes. Vascular/Lymphatic: No significant vascular findings are present. No enlarged abdominal or pelvic lymph nodes. Reproductive: Prostatomegaly. Other: No abdominal wall hernia or abnormality. No abdominopelvic ascites. Musculoskeletal: No acute or significant osseous findings. IMPRESSION: 1. There are large air and fluid containing lesions of the left lobe of the liver, the larger measuring at least 14.7 cm and a smaller adjacent lesion measuring 3.8 cm  (series 3, image 14). It is unclear whether one or both of these lesions communicate to the biliary system. The presence of air is concerning for liver abscess. As on prior ultrasound examinations, contrast enhanced MRI may be useful to further characterize. 2. Gallstones described on prior ultrasound examinations are not well appreciated by CT. Electronically Signed   By: AEddie CandleM.D.   On: 07/02/2018 15:58   Ir Guided DNiel HummerW Catheter Placement  Result Date: 07/03/2018 INDICATION: Midline subcapsular hepatic abscess EXAM: Ultrasound subcapsular hepatic abscess drain MEDICATIONS: The patient is currently admitted to the hospital and receiving intravenous antibiotics. The antibiotics were administered within an appropriate time frame prior to the initiation of the procedure. ANESTHESIA/SEDATION: Fentanyl 100 mcg IV; Versed 2.0 mg IV Moderate Sedation Time:  18 minutes The patient was continuously monitored during the procedure by the interventional radiology nurse under my direct supervision. COMPLICATIONS: None immediate. PROCEDURE: Informed written  consent was obtained from the patient after a thorough discussion of the procedural risks, benefits and alternatives. All questions were addressed. Maximal Sterile Barrier Technique was utilized including caps, mask, sterile gowns, sterile gloves, sterile drape, hand hygiene and skin antiseptic. A timeout was performed prior to the initiation of the procedure. Previous imaging reviewed. Preliminary ultrasound performed. The subhepatic abscess was localized from a subxiphoid window in the midline. A transhepatic approach to the left hepatic lobe was localized and marked. Under sterile conditions and local anesthesia, an 18 gauge 15 cm access needle was advanced transhepatic [place] under direct ultrasound into the abscess. Needle position confirmed with ultrasound. Syringe aspiration yielded purulent fluid. Sample sent for culture. Amplatz guidewire inserted  followed by tract dilatation to insert a 12 [place] drain. Drain catheter position confirmed with ultrasound. Syringe aspiration yielded 660 cc total purulent fluid. This collapsed abscess cavity. Catheter secured with Prolene suture and connected to external suction bulb. No immediate complication. Patient tolerated the procedure well. IMPRESSION: Successful ultrasound left hepatic subcapsular abscess drain insertion. Electronically Signed   By: Jerilynn Mages.  Shick M.D.   On: 07/03/2018 16:12   [place] Ekg Site Rite  Result Date: 07/07/2018 If [facility] image not attached, placement could not be confirmed due to current cardiac rhythm.  US Abdomen Limited Ruq  Result Date: 07/02/2018 CLINICAL DATA:  Right upper quadrant pain. EXAM: ULTRASOUND ABDOMEN LIMITED RIGHT UPPER QUADRANT COMPARISON:  Ultrasound June 28, 2018 FINDINGS: Gallbladder: There are multiple stones in the gallbladder with the largest measuring 1.8 cm. The gallbladder is contracted. No wall thickening or Murphy's sign. No pericholecystic fluid. Common bile duct: Diameter: 7.3 mm Liver: There are 2 complicated cystic masses in the liver. The first in the left hepatic lobe measures 11 x 9 x 9 cm. The smaller adjacent mass measures 4 x 2.4 x 2.9 cm. Portal vein is patent on color Doppler imaging with normal direction of blood flow towards the liver. IMPRESSION: 1. The gallbladder is contracted limiting evaluation. The gallbladder contains stones with the largest measuring 1.8 cm. No wall thickening, pericholecystic fluid, or Murphy's sign. 2. The common bile duct is borderline measuring 7.3 mm. Recommend correlation with labs. 3. 2 complex cystic masses in the left hepatic lobe with the largest measuring 11 cm. Recommend either an MRI with and without contrast or a contrast-enhanced CT scan for further evaluation. An MRI is favored if the patient is MRI compatible. Electronically Signed   By: Dorise Bullion III M.D   On: 07/02/2018 14:23   US Abdomen  Limited Ruq  Result Date: 06/28/2018 CLINICAL DATA:  Right upper quadrant pain. Symptoms for several weeks. EXAM: ULTRASOUND ABDOMEN LIMITED RIGHT UPPER QUADRANT COMPARISON:  None. FINDINGS: Gallbladder: Gallbladder is contracted. There are shadowing stones limiting assessment of the gallbladder. Largest stone measures 1.5 cm. Gallbladder wall is borderline to mildly thickened measuring 3.7 mm. No pericholecystic fluid. Common bile duct: Diameter: 6 mm Liver: Mildly increased parenchymal echogenicity. Somewhat coarsened echotexture. 9 x 8 x 9 cm complex cyst in the left lobe. Adjacent septated cyst measuring 4 x 3 x 2 cm portal vein is patent on color Doppler imaging with normal direction of blood flow towards the liver. IMPRESSION: 1. No acute findings. 2. Gallstones with significant posterior shadowing limiting assessment of the gallbladder. Gallbladder assessment also limited by lack of distension. Wall measures borderline to mildly thickened, this is felt be due to lack of distension. 3. Mild increased liver parenchymal echogenicity suggesting hepatic steatosis. 4. Two complicated liver  cysts in the left lobe, largest measuring 9 cm. Recommend further assessment of these cystic masses with either liver MRI, with and without contrast, or contrast enhanced CT. Electronically Signed   By: Lajean Manes M.D.   On: 06/28/2018 15:44    Microbiology: Recent Results (from the past 240 hour(s))  Aerobic/Anaerobic Culture (surgical/deep wound)     Status: None   Collection Time: 07/03/18  2:35 PM  Result Value Ref Range Status   Specimen Description ABSCESS LIVER  Final   Special Requests Normal  Final   Gram Stain   Final    ABUNDANT WBC PRESENT,BOTH PMN AND MONONUCLEAR ABUNDANT GRAM POSITIVE COCCI IN PAIRS AND CHAINS FEW GRAM NEGATIVE RODS FEW GRAM VARIABLE ROD Performed at [facility], [place] [address], [place], [place] [address]    Culture   Final    ABUNDANT STREPTOCOCCUS ANGINOSIS ABUNDANT  PREVOTELLA DENTICOLA BETA LACTAMASE POSITIVE    Report Status 07/07/2018 FINAL  Final   Organism ID, Bacteria STREPTOCOCCUS ANGINOSIS  Final      Susceptibility   Streptococcus anginosis - MIC*    PENICILLIN 0.12 SENSITIVE Sensitive     CEFTRIAXONE 0.5 SENSITIVE Sensitive     ERYTHROMYCIN <=0.12 SENSITIVE Sensitive     LEVOFLOXACIN 0.5 SENSITIVE Sensitive     VANCOMYCIN 0.5 SENSITIVE Sensitive     * ABUNDANT STREPTOCOCCUS ANGINOSIS  C difficile quick scan w PCR reflex     Status: None   Collection Time: 07/04/18  8:50 AM  Result Value Ref Range Status   C Diff antigen NEGATIVE NEGATIVE Final   C Diff toxin NEGATIVE NEGATIVE Final   C Diff interpretation No C. difficile detected.  Final    Comment: Performed at [facility], [place] [address] [place] [address], [place], [place] [address]     Labs: Basic Metabolic Panel: Recent Labs  Lab 07/03/18 0326 07/04/18 0314 07/05/18 0302 07/06/18 0257 07/06/18 1514 07/07/18 0251  NA 138 139 137 136  --  137  K 3.1* 3.5 3.3* 3.2*  --  3.9  CL 98 100 100 100  --  102  CO2 33* '29 28 29  ' --  26  GLUCOSE 117* 119* 116* 131*  --  137*  BUN '10 12 12 10  ' --  7*  CREATININE 1.07 1.37* 1.01 0.97  --  0.85  CALCIUM 8.3* 8.3* 8.3* 8.2*  --  8.3*  MG  --   --   --   --  2.1  --    Liver Function Tests: Recent Labs  Lab 07/02/18 1011 07/03/18 0326 07/04/18 0314  AST 45* 40 50*  ALT 49* 43 47*  ALKPHOS 149* 119 121  BILITOT 1.4* 1.2 1.5*  PROT 6.8 5.7* 5.8*  ALBUMIN 2.3* 2.0* 1.9*   Recent Labs  Lab 07/02/18 1011  LIPASE 35   No results for input(s): AMMONIA in the last 168 hours. CBC: Recent Labs  Lab 07/02/18 1011 07/03/18 0326 07/04/18 0314 07/05/18 0302  WBC 15.9* 14.0* 15.3* 12.5*  HGB 14.8 13.4 12.6* 12.4*  HCT 46.3 42.1 40.5 38.3*  MCV 100.9* 101.2* 102.8* 102.1*  PLT 318 276 299 326    CBG: Recent Labs  Lab 07/06/18 2133 07/07/18 0813  GLUCAP 104* 109*       Signed:  Oswald Hillock MD.  [facility] 07/08/2018, 8:38 AM

## 2018-07-08 NOTE — Progress Notes (Signed)
Patient will DC to: [facility] Anticipated DC date: 07/08/2018 Family notified: Yes Transport by: Sharin Mons   Per MD patient ready for DC to . RN, patient, patient's family, and facility notified of DC. Discharge Summary and FL2 sent to facility. RN to call report prior to discharge [phone]). DC packet on chart. Ambulance transport requested for patient.   CSW will sign off for now as social work intervention is no longer needed. Please consult [place] again if new needs arise.  Farley Crooker, LCSW-A [place]/Clinical Social Work Department Cell: [phone]

## 2018-07-08 NOTE — Progress Notes (Signed)
Patient ID: Brad Reed, male   DOB: [birth year], 84 y.o.   MRN: [id number]   Pt is transferring to [facility] now  Liver abscess drain must be flushed 1-2x daily with 5-10 cc sterile saline Please record OP  [facility] will hear from IR scheduler for drain check and imaging appointment  Son is aware of plan

## 2018-07-08 NOTE — Clinical Social Work Placement (Signed)
Nurse to call report to [phone] and patient will report to room 603. Transportation has been scheduled for 11:30am.   CLINICAL SOCIAL WORK PLACEMENT  NOTE  Date:  07/08/2018  Patient Details  Name: Brad Reed MRN: [id number] Date of Birth: February 18, 1935  Clinical Social Work is seeking post-discharge placement for this patient at the Skilled  Nursing Facility level of care (*CSW will initial, date and re-position this form in  chart as items are completed):  Yes   Patient/family provided with [place] Clinical Social Work Department's list of facilities offering this level of care within the geographic area requested by the patient (or if unable, by the patient's family).  Yes   Patient/family informed of their freedom to choose among providers that offer the needed level of care, that participate in Medicare, Medicaid or managed care program needed by the patient, have an available bed and are willing to accept the patient.  Yes   Patient/family informed of [place]'s ownership interest in [facility] and [facility], as well as of the fact that they are under no obligation to receive care at these facilities.  PASRR submitted to EDS on 07/06/18     PASRR number received on 07/06/18     Existing PASRR number confirmed on       FL2 transmitted to all facilities in geographic area requested by pt/family on 07/06/18     FL2 transmitted to all facilities within larger geographic area on       Patient informed that his/her managed care company has contracts with or will negotiate with certain facilities, including the following:        Yes   Patient/family informed of bed offers received.  Patient chooses bed at [facility]     Physician recommends and patient chooses bed at      Patient to be transferred to [facility] on 07/08/18.  Patient to be transferred to facility by [facility]     Patient family notified on 07/08/18 of transfer.  Name of family member  notified:  Dr. Renee Ramus, Son     PHYSICIAN       Additional Comment:    _______________________________________________ Gwenlyn Fudge, LCSWA 07/08/2018, 10:25 AM

## 2018-07-09 ENCOUNTER — Telehealth: Payer: Self-pay

## 2018-07-09 ENCOUNTER — Ambulatory Visit: Payer: Medicare PPO | Admitting: Internal Medicine

## 2018-07-09 ENCOUNTER — Other Ambulatory Visit: Payer: Self-pay | Admitting: Infectious Diseases

## 2018-07-09 DIAGNOSIS — K750 Abscess of liver: Secondary | ICD-10-CM

## 2018-07-09 NOTE — Telephone Encounter (Signed)
LPN called paitent and left VM. Patient needs 3 week HSFU visit with Dr. Ninetta Lights for liver abscess.  Valarie Cones, LPN

## 2018-07-11 NOTE — Telephone Encounter (Signed)
LPN made patient aware of new appointment date/time and location with Dr. Ninetta Lights on 3/18 at 10:30. Patient states he is currently in SNF at [facility]. LPN called SNF and left message for appointment coordinator  make facility aware of new appointment date/time and location.  Valarie Cones, LPN

## 2018-07-12 DIAGNOSIS — I10 Essential (primary) hypertension: Secondary | ICD-10-CM | POA: Diagnosis not present

## 2018-07-12 DIAGNOSIS — R21 Rash and other nonspecific skin eruption: Secondary | ICD-10-CM | POA: Diagnosis not present

## 2018-07-12 DIAGNOSIS — K750 Abscess of liver: Secondary | ICD-10-CM | POA: Diagnosis not present

## 2018-07-12 DIAGNOSIS — R7301 Impaired fasting glucose: Secondary | ICD-10-CM | POA: Diagnosis not present

## 2018-07-17 ENCOUNTER — Other Ambulatory Visit: Payer: Medicare PPO

## 2018-07-17 DIAGNOSIS — K750 Abscess of liver: Secondary | ICD-10-CM | POA: Diagnosis not present

## 2018-07-17 DIAGNOSIS — R21 Rash and other nonspecific skin eruption: Secondary | ICD-10-CM | POA: Diagnosis not present

## 2018-07-18 ENCOUNTER — Ambulatory Visit
Admission: RE | Admit: 2018-07-18 | Discharge: 2018-07-18 | Disposition: A | Discharge status: Home / Self Care | Payer: Medicare PPO | Source: Ambulatory Visit | Attending: Infectious Diseases | Admitting: Infectious Diseases

## 2018-07-18 ENCOUNTER — Encounter: Payer: Self-pay | Admitting: Radiology

## 2018-07-18 ENCOUNTER — Ambulatory Visit
Admission: RE | Admit: 2018-07-18 | Discharge: 2018-07-18 | Disposition: A | Discharge status: Home / Self Care | Payer: Medicare PPO | Source: Ambulatory Visit | Attending: Radiology | Admitting: Radiology

## 2018-07-18 ENCOUNTER — Encounter: Payer: Self-pay | Admitting: Diagnostic Radiology

## 2018-07-18 DIAGNOSIS — K750 Abscess of liver: Secondary | ICD-10-CM

## 2018-07-18 DIAGNOSIS — K7689 Other specified diseases of liver: Secondary | ICD-10-CM | POA: Diagnosis not present

## 2018-07-18 HISTORY — PX: IR RADIOLOGIST EVAL & MGMT: IMG5224

## 2018-07-18 MED ORDER — IOPAMIDOL (ISOVUE-300) INJECTION 61%
125.0000 mL | Freq: Once | INTRAVENOUS | Status: AC | PRN
  Administered 2018-07-18: 125 mL via INTRAVENOUS

## 2018-07-18 NOTE — Progress Notes (Signed)
Referring Physician(s): Dr Johnnye Sima; Dr Wilhemena Durie  Chief Complaint: The patient is seen in follow up today s/p  Subcapsular hepatic abscess drain placed 07/03/18  History of present illness:  DC'd to SNF [facility] 07/08/18 Pt says he feels so much better Denies N/V Denies fever chills OP of drain is scant to none Has changed from dark yellow to blood tinged in last few days Still on IV and po antibiotics PICC line in place He states SNF not flushing drain  Scheduled today for CT - evaluation of drain   Past Medical History:  Diagnosis Date  . Angiodysplasia 02/13/2006  . Arthritis    knees  . Benign prostatic hypertrophy   . Colon polyps   . Diverticulosis of colon   . Hypertension   . Macular degeneration, wet ([place])    Dr Dwain Sarna  . Obesity   . Peripheral neuropathy    feet  . Sleep apnea    DX'd when younger. declined CPAP.    Past Surgical History:  Procedure Laterality Date  . APPENDECTOMY    . CATARACT EXTRACTION W/PHACO Left 03/05/2018   Procedure: CATARACT EXTRACTION PHACO AND INTRAOCULAR LENS PLACEMENT ([place]) LEFT;  Surgeon: Eulogio Bear, MD;  Location: [facility];  Service: Ophthalmology;  Laterality: Left;  sleep apnea  . CATARACT EXTRACTION W/PHACO Right 03/19/2018   Procedure: CATARACT EXTRACTION PHACO AND INTRAOCULAR LENS PLACEMENT ([facility]);  Surgeon: Eulogio Bear, MD;  Location: [place];  Service: Ophthalmology;  Laterality: Right;  . IR GUIDED DRAIN W CATHETER PLACEMENT  07/03/2018  . KNEE ARTHROSCOPY     BOTH KNEES  . MASTOID DEBRIDEMENT    . PILONIDAL CYST EXCISION  1960  . TONSILLECTOMY      Allergies: Patient has no known allergies.  Medications: Prior to Admission medications   Medication Sig Start Date End Date Taking? Authorizing Provider  acetaminophen (TYLENOL) 325 MG tablet Take 2 tablets (650 mg total) by mouth every 6 (six) hours as needed for mild pain or moderate pain (or Fever >/= 101).  07/08/18   Oswald Hillock, MD  acidophilus (RISAQUAD) CAPS capsule Take 2 capsules by mouth daily. 07/08/18   Oswald Hillock, MD  cefTRIAXone (ROCEPHIN) IVPB Inject 2 g into the vein daily for 18 days. Indication:  Liver abscess Last Day of Therapy:  07/24/18 Labs - Once weekly:  CBC/D and BMP, Labs - Every other week:  ESR and CRP 07/08/18 07/26/18  Oswald Hillock, MD  multivitamin-lutein [facility]) CAPS capsule Take 1 capsule by mouth daily.    [provider]  omeprazole (PRILOSEC) 20 MG capsule Take 1 capsule (20 mg total) by mouth 2 (two) times daily before a meal. 06/25/18   Venia Carbon, MD  ondansetron (ZOFRAN) 4 MG tablet Take 1 tablet (4 mg total) by mouth every 6 (six) hours as needed for nausea. 07/08/18   Oswald Hillock, MD  potassium chloride (K-DUR) 10 MEQ tablet TAKE 1 TABLET (10 MEQ TOTAL) BY MOUTH 2 (TWO) TIMES DAILY. Patient taking differently: Take 10 mEq by mouth daily.  09/14/16   Venia Carbon, MD  saccharomyces boulardii (FLORASTOR) 250 MG capsule Take 1 capsule (250 mg total) by mouth 2 (two) times daily. 07/08/18   Oswald Hillock, MD  triamterene-hydrochlorothiazide (DYAZIDE) 37.5-25 MG capsule TAKE ONE CAPSULE BY MOUTH EVERY MORNING Patient taking differently: Take 1 capsule by mouth daily.  03/14/18   Venia Carbon, MD     Family History  Problem Relation Age of Onset  . Cancer Mother   . Obesity Sister   . Diabetes Sister   . Diabetes Maternal Uncle     Social History   Socioeconomic History  . Marital status: Widowed    Spouse name: Not on file  . Number of children: 3  . Years of education: Not on file  . Highest education level: Not on file  Occupational History  . 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 Financial resource strain: Not on file  . Food insecurity:    Worry: Not on file    Inability: Not on file  . Transportation needs:    Medical: Not on file     Non-medical: Not on file  Tobacco Use  . Smoking status: Former Smoker    Types: Cigarettes    Last attempt to quit: 05/17/1983    Years since quitting: 35.1  . Smokeless tobacco: Never Used  Substance and Sexual Activity  . Alcohol use: Yes    Alcohol/week: 14.0 standard drinks    Types: 14 Shots of liquor per week    Comment: Drinks 2 cocktails /day  . Drug use: No  . Sexual activity: Not on file  Lifestyle  . Physical activity:    Days per week: Not on file    Minutes per session: Not on file  . Stress: Not on file  Relationships  . Social connections:    Talks on phone: Not on file    Gets together: Not on file    Attends religious service: Not on file    Active member of club or organization: Not on file    Attends meetings of clubs or organizations: Not on file    Relationship status: Not on file  Other Topics Concern  . Not on file  Social History Narrative   Has living will    Son Monica Martinez is health care POA   Would accept resuscitation but no prolonged artificial life support   No tube feeds if cognitively unaware     Physical Exam Skin:    General: Skin is warm and dry.     Comments: Site is clean and dry NT no bleeding No sign of infection  CT shows resolution of collection per Dr Fabio Bering removed without complication per Dr Vernard Gambles Dressing placed  Neurological:     Mental Status: He is alert.     Imaging: No results found.  Labs:  CBC: Recent Labs    07/02/18 1011 07/03/18 0326 07/04/18 0314 07/05/18 0302  WBC 15.9* 14.0* 15.3* 12.5*  HGB 14.8 13.4 12.6* 12.4*  HCT 46.3 42.1 40.5 38.3*  PLT 318 276 299 326    COAGS: No results for input(s): INR, APTT in the last 8760 hours.  BMP: Recent Labs    07/04/18 0314 07/05/18 0302 07/06/18 0257 07/07/18 0251  NA 139 137 136 137  K 3.5 3.3* 3.2* 3.9  CL 100 100 100 102  CO2 _0 GLUCOSE 119* 116* 131* 137*  BUN _1 7*  CALCIUM 8.3* 8.3* 8.2* 8.3*  CREATININE 1.37*  1.01 0.97 0.85  GFRNONAA 48* >60 >60 >60  GFRAA 56* >60 >60 >60    LIVER FUNCTION TESTS: Recent Labs    06/25/18 1108 07/02/18 1011 07/03/18 0326 07/04/18 0314  BILITOT 1.2 1.4* 1.2 1.5*  AST 46* 45* 40 50*  ALT 44 49* 43 47*  ALKPHOS  122* 149* 119 121  PROT 6.9 6.8 5.7* 5.8*  ALBUMIN 3.0* 2.3* 2.0* 1.9*    Assessment:  Subcapsular hepatic abscess Drain removed today after CT showing abscess resolution To follow with Dr Johnnye Sima and Dr Silvio Pate Pt returned to [facility]: Lavonia Drafts, PA-C 07/18/2018, 2:26 PM   Please refer to Dr. Vernard Gambles attestation of this note for management and plan.

## 2018-07-20 ENCOUNTER — Telehealth: Payer: Self-pay | Admitting: Infectious Diseases

## 2018-07-20 NOTE — Telephone Encounter (Signed)
Called pt with result of his CT scan (abscess resolved).  Left VM.

## 2018-07-26 NOTE — Progress Notes (Signed)
Patient: Brad Reed  DOB: 01-Oct-1934 MRN: [id number] PCP: Karie Schwalbe, MD   Patient Active Problem List   Diagnosis Date Noted  . Pressure injury of skin 07/03/2018  . Abnormal LFTs 07/02/2018  . Hepatic abscess 07/02/2018  . Arrhythmia 09/14/2016  . Impaired fasting glucose 08/26/2014  . Advance directive discussed with patient 08/26/2014  . Peripheral neuropathy   . Pain in limb 02/25/2013  . Dermatophytosis of nail 02/25/2013  . Routine general medical examination at a health care facility 08/16/2011  . Macular degeneration, wet (HCC)   . Obesity, Class III, BMI 40-49.9 (morbid obesity) (HCC) 08/03/2010  . BPH with obstruction/lower urinary tract symptoms 12/24/2007  . Essential hypertension, benign 12/13/2006  . DIVERTICULOSIS, COLON 12/13/2006     Subjective:  CC:  Hospital follow up liver abscess. Feeling much better.  HPI:  Brad Reed is a 84 y.o. male who was admitted to the hospital late February 2020 and found to have liver abscess and adult failure to thrive. Three weeks before admission he was experiencing weakness and poor intake. Found to have leukocytosis and CT scan revealed multiple liver abscesses. Underwent percutaneous drain on 2/18 with 6.6 L of purulent material removed that cultured out abundant streptococcus anginosis and abundant prevotella denticola (beta lactamase +). He was discharged to [facility] on 3 weeks of ceftriaxone. He has had follow up with drain clinic on 07/18/18 with repeat CT scan revealing resolved subscapular hepatic abscess and drain was removed.   Since discharge he has continued to feel better and better. His daughter (a Charity fundraiser) is with him today and has been pleased with progress. He still has PICC in place. PICC line is without pain, drainage or erythema and is well maintained by SNF. No swelling or altered sensation in affected distal extremity. Asking about how to avoid this from happening again. His daughter says  that he has been a higher volume drinker in the past and ongoing daily drinker now. Has 2 larger glasses of liquor a night. Has not had any however in the last 3 weeks with current illness. He has not been seen by a dentist in many years and does not routinely offer himself oral care.   Review of Systems  Constitutional: Negative for chills, fever and malaise/fatigue.  Respiratory: Negative for cough.   Cardiovascular: Positive for leg swelling (baseline). Negative for chest pain.  Gastrointestinal: Negative for abdominal pain, diarrhea and nausea.       Improved appetite  Genitourinary: Negative for dysuria and flank pain.  Skin: Negative for rash.  Neurological: Negative for dizziness.    Past Medical History:  Diagnosis Date  . Angiodysplasia 02/13/2006  . Arthritis    knees  . Benign prostatic hypertrophy   . Colon polyps   . Diverticulosis of colon   . Hypertension   . Macular degeneration, wet (HCC)    Dr Chyrl Civatte  . Obesity   . Peripheral neuropathy    feet  . Sleep apnea    DX'd when younger. declined CPAP.    Outpatient Medications Prior to Visit  Medication Sig Dispense Refill  . acetaminophen (TYLENOL) 325 MG tablet Take 2 tablets (650 mg total) by mouth every 6 (six) hours as needed for mild pain or moderate pain (or Fever >/= 101).    Marland Kitchen acidophilus (RISAQUAD) CAPS capsule Take 2 capsules by mouth daily.    . multivitamin-lutein (OCUVITE-LUTEIN) CAPS capsule Take 1 capsule by mouth daily.    Marland Kitchen  omeprazole (PRILOSEC) 20 MG capsule Take 1 capsule (20 mg total) by mouth 2 (two) times daily before a meal. 60 capsule 3  . ondansetron (ZOFRAN) 4 MG tablet Take 1 tablet (4 mg total) by mouth every 6 (six) hours as needed for nausea. 20 tablet 0  . potassium chloride (K-DUR) 10 MEQ tablet TAKE 1 TABLET (10 MEQ TOTAL) BY MOUTH 2 (TWO) TIMES DAILY. (Patient taking differently: Take 10 mEq by mouth daily. ) 180 tablet 2  . saccharomyces boulardii (FLORASTOR) 250 MG  capsule Take 1 capsule (250 mg total) by mouth 2 (two) times daily.    Marland Kitchen triamterene-hydrochlorothiazide (DYAZIDE) 37.5-25 MG capsule TAKE ONE CAPSULE BY MOUTH EVERY MORNING (Patient taking differently: Take 1 capsule by mouth daily. ) 90 capsule 3   No facility-administered medications prior to visit.      Allergies  Allergen Reactions  . Ampicillin Hives    Social History   Tobacco Use  . Smoking status: Former Smoker    Types: Cigarettes    Last attempt to quit: 05/17/1983    Years since quitting: 35.2  . Smokeless tobacco: Never Used  Substance Use Topics  . Alcohol use: Yes    Alcohol/week: 14.0 standard drinks    Types: 14 Shots of liquor per week    Comment: Drinks 2 cocktails /day  . Drug use: No    Family History  Problem Relation Age of Onset  . Cancer Mother   . Obesity Sister   . Diabetes Sister   . Diabetes Maternal Uncle     Objective:   Vitals:   07/27/18 0953  BP: 117/77  Pulse: (!) 109  Weight: 283 lb (128.4 kg)  Height:  (1.727 m)   Body mass index is 43.03 kg/m.  Physical Exam Constitutional:      Appearance: Normal appearance. He is obese. He is not ill-appearing or diaphoretic.  HENT:     Mouth/Throat:     Mouth: Mucous membranes are moist.     Pharynx: Oropharynx is clear.     Comments: Poor dentition. Gingivitis. Multiple missing teeth.  Eyes:     General: No scleral icterus.    Pupils: Pupils are equal, round, and reactive to light.  Cardiovascular:     Rate and Rhythm: Regular rhythm. Tachycardia present.     Pulses: Normal pulses.     Heart sounds: No murmur.  Pulmonary:     Effort: Pulmonary effort is normal.     Breath sounds: Normal breath sounds.  Abdominal:     General: Abdomen is flat. There is no distension.     Palpations: There is no mass.     Tenderness: There is no abdominal tenderness.  Musculoskeletal: Normal range of motion.  Skin:    General: Skin is warm and dry.     Capillary Refill: Capillary refill  takes less than 2 seconds.     Comments: PICC line - clean/dry dressing. Insertion site w/o erythema, tenderness, drainage, cording or distal swelling of affected extremity    Neurological:     Mental Status: He is alert and oriented to person, place, and time.     Motor: No weakness.  Psychiatric:        Mood and Affect: Mood normal.        Thought Content: Thought content normal.        Judgment: Judgment normal.    Lab Results: Lab Results  Component Value Date   WBC 12.5 (H) 07/05/2018   HGB 12.4 (  L) 07/05/2018   HCT 38.3 (L) 07/05/2018   MCV 102.1 (H) 07/05/2018   PLT 326 07/05/2018    Lab Results  Component Value Date   CREATININE 0.85 07/07/2018   BUN 7 (L) 07/07/2018   NA 137 07/07/2018   K 3.9 07/07/2018   CL 102 07/07/2018   CO2 26 07/07/2018    Lab Results  Component Value Date   ALT 47 (H) 07/04/2018   AST 50 (H) 07/04/2018   ALKPHOS 121 07/04/2018   BILITOT 1.5 (H) 07/04/2018     Assessment & Plan:   Problem List Items Addressed This Visit      Unprioritized   Hepatic abscess - Primary    Large volume liver abscess d/t provetella and strep species. He has had excellent response to IV antibiotics and percutaneous drainage. Abscess was resolved 1 week ago and he has continued antibiotics for another week beyond resolution. Would suspect that this infection is cured. No further imaging needed at this time w/o symptoms.  Recommended to have dental evaluation to ensure no trouble and encouraged regular oral care. I explained to them that often these occur spontaneously due to over burden of bacteria in the blood. Would recommend to reduce drinking and at least cut in half to reduce liver inflammation. Educated that a standard drink of liquor is 2 oz not a large glass so he is likely somewhere getting 4 standard drinks a day.  He can follow up with PCP and no further indications for antibiotics.         I spent 25 minutes with the patient with 75% of the time  in direct discussion of the above and review of hospital records.   Rexene Alberts, MSN, NP-C [facility] for Infectious Disease [facility] Pager: [phone] Office: [phone]  07/28/18  7:06 PM

## 2018-07-27 ENCOUNTER — Encounter: Payer: Self-pay | Admitting: Infectious Diseases

## 2018-07-27 ENCOUNTER — Ambulatory Visit (INDEPENDENT_AMBULATORY_CARE_PROVIDER_SITE_OTHER): Payer: Medicare PPO | Admitting: Infectious Diseases

## 2018-07-27 ENCOUNTER — Other Ambulatory Visit: Payer: Self-pay

## 2018-07-27 VITALS — BP 117/77 | HR 109 | Ht 68.0 in | Wt 283.0 lb

## 2018-07-27 DIAGNOSIS — K750 Abscess of liver: Secondary | ICD-10-CM

## 2018-07-27 NOTE — Patient Instructions (Signed)
Nice to see you are feeling better.   Will have [facility] remove your PICC line.   No further need for antibiotics as the abscess has resolved.   Would decrease your drinking (cut in half if you can) to reduce inflammation of the liver.   Would have a dental evaluation and cleaning as the bacteria is mouth.

## 2018-07-28 DIAGNOSIS — I10 Essential (primary) hypertension: Secondary | ICD-10-CM | POA: Diagnosis not present

## 2018-07-28 DIAGNOSIS — G4733 Obstructive sleep apnea (adult) (pediatric): Secondary | ICD-10-CM | POA: Diagnosis not present

## 2018-07-28 DIAGNOSIS — L89152 Pressure ulcer of sacral region, stage 2: Secondary | ICD-10-CM | POA: Diagnosis not present

## 2018-07-28 DIAGNOSIS — R338 Other retention of urine: Secondary | ICD-10-CM | POA: Diagnosis not present

## 2018-07-28 DIAGNOSIS — N401 Enlarged prostate with lower urinary tract symptoms: Secondary | ICD-10-CM | POA: Diagnosis not present

## 2018-07-28 DIAGNOSIS — N138 Other obstructive and reflux uropathy: Secondary | ICD-10-CM | POA: Diagnosis not present

## 2018-07-28 DIAGNOSIS — Z466 Encounter for fitting and adjustment of urinary device: Secondary | ICD-10-CM | POA: Diagnosis not present

## 2018-07-28 DIAGNOSIS — H3530 Unspecified macular degeneration: Secondary | ICD-10-CM | POA: Diagnosis not present

## 2018-07-28 DIAGNOSIS — G629 Polyneuropathy, unspecified: Secondary | ICD-10-CM | POA: Diagnosis not present

## 2018-07-28 NOTE — Assessment & Plan Note (Signed)
Large volume liver abscess d/t provetella and strep species. He has had excellent response to IV antibiotics and percutaneous drainage. Abscess was resolved 1 week ago and he has continued antibiotics for another week beyond resolution. Would suspect that this infection is cured. No further imaging needed at this time w/o symptoms.  Recommended to have dental evaluation to ensure no trouble and encouraged regular oral care. I explained to them that often these occur spontaneously due to over burden of bacteria in the blood. Would recommend to reduce drinking and at least cut in half to reduce liver inflammation. Educated that a standard drink of liquor is 2 oz not a large glass so he is likely somewhere getting 4 standard drinks a day.  He can follow up with PCP and no further indications for antibiotics.

## 2018-07-29 DIAGNOSIS — Z466 Encounter for fitting and adjustment of urinary device: Secondary | ICD-10-CM | POA: Diagnosis not present

## 2018-07-29 DIAGNOSIS — G629 Polyneuropathy, unspecified: Secondary | ICD-10-CM | POA: Diagnosis not present

## 2018-07-29 DIAGNOSIS — I10 Essential (primary) hypertension: Secondary | ICD-10-CM | POA: Diagnosis not present

## 2018-07-29 DIAGNOSIS — H3530 Unspecified macular degeneration: Secondary | ICD-10-CM | POA: Diagnosis not present

## 2018-07-29 DIAGNOSIS — G4733 Obstructive sleep apnea (adult) (pediatric): Secondary | ICD-10-CM | POA: Diagnosis not present

## 2018-07-29 DIAGNOSIS — N401 Enlarged prostate with lower urinary tract symptoms: Secondary | ICD-10-CM | POA: Diagnosis not present

## 2018-07-29 DIAGNOSIS — R338 Other retention of urine: Secondary | ICD-10-CM | POA: Diagnosis not present

## 2018-07-29 DIAGNOSIS — L89152 Pressure ulcer of sacral region, stage 2: Secondary | ICD-10-CM | POA: Diagnosis not present

## 2018-07-29 DIAGNOSIS — N138 Other obstructive and reflux uropathy: Secondary | ICD-10-CM | POA: Diagnosis not present

## 2018-07-30 ENCOUNTER — Telehealth: Payer: Self-pay | Admitting: Internal Medicine

## 2018-07-30 NOTE — Telephone Encounter (Signed)
Go ahead and start flomax--send Rx---but I think he had sig urinary retention so the catheter may be staying. He will need urology referral---if that hasn't been done, I can put it in (check with him or family) Okay to go ahead with therapy orders

## 2018-07-30 NOTE — Telephone Encounter (Signed)
Spoke to Nurse. She will set up his urology appt and do his tamsulosin rx. She will advise the family.

## 2018-07-30 NOTE — Telephone Encounter (Signed)
Best number [phone]  Michaelene Song @ [facility] Called to get orders  Pt was sent home with foley cath with new dx of BPH At the facility they did not start flomax   Foley care orders Pt does not have urology   Needs referral She wanted to know if you would call in rx for flomax  Sacral pressure stage 2 this has been treated since feb looks good   Orders for PT and OT Skilled nurse frequency  1 week 1 2 week 4  Repeat labs tomorrow Repeat  CBC chemistry panel So you will have results 3/19 with appointment

## 2018-07-31 DIAGNOSIS — N138 Other obstructive and reflux uropathy: Secondary | ICD-10-CM | POA: Diagnosis not present

## 2018-07-31 DIAGNOSIS — L89152 Pressure ulcer of sacral region, stage 2: Secondary | ICD-10-CM | POA: Diagnosis not present

## 2018-07-31 DIAGNOSIS — N401 Enlarged prostate with lower urinary tract symptoms: Secondary | ICD-10-CM | POA: Diagnosis not present

## 2018-07-31 DIAGNOSIS — G629 Polyneuropathy, unspecified: Secondary | ICD-10-CM | POA: Diagnosis not present

## 2018-07-31 DIAGNOSIS — Z466 Encounter for fitting and adjustment of urinary device: Secondary | ICD-10-CM | POA: Diagnosis not present

## 2018-07-31 DIAGNOSIS — R338 Other retention of urine: Secondary | ICD-10-CM | POA: Diagnosis not present

## 2018-07-31 DIAGNOSIS — H3530 Unspecified macular degeneration: Secondary | ICD-10-CM | POA: Diagnosis not present

## 2018-07-31 DIAGNOSIS — K750 Abscess of liver: Secondary | ICD-10-CM | POA: Diagnosis not present

## 2018-07-31 DIAGNOSIS — I10 Essential (primary) hypertension: Secondary | ICD-10-CM | POA: Diagnosis not present

## 2018-07-31 DIAGNOSIS — G4733 Obstructive sleep apnea (adult) (pediatric): Secondary | ICD-10-CM | POA: Diagnosis not present

## 2018-08-01 ENCOUNTER — Ambulatory Visit: Payer: Medicare PPO | Admitting: Infectious Diseases

## 2018-08-01 DIAGNOSIS — N138 Other obstructive and reflux uropathy: Secondary | ICD-10-CM | POA: Diagnosis not present

## 2018-08-01 DIAGNOSIS — G629 Polyneuropathy, unspecified: Secondary | ICD-10-CM | POA: Diagnosis not present

## 2018-08-01 DIAGNOSIS — I10 Essential (primary) hypertension: Secondary | ICD-10-CM | POA: Diagnosis not present

## 2018-08-01 DIAGNOSIS — R338 Other retention of urine: Secondary | ICD-10-CM | POA: Diagnosis not present

## 2018-08-01 DIAGNOSIS — Z466 Encounter for fitting and adjustment of urinary device: Secondary | ICD-10-CM | POA: Diagnosis not present

## 2018-08-01 DIAGNOSIS — H3530 Unspecified macular degeneration: Secondary | ICD-10-CM | POA: Diagnosis not present

## 2018-08-01 DIAGNOSIS — G4733 Obstructive sleep apnea (adult) (pediatric): Secondary | ICD-10-CM | POA: Diagnosis not present

## 2018-08-01 DIAGNOSIS — L89152 Pressure ulcer of sacral region, stage 2: Secondary | ICD-10-CM | POA: Diagnosis not present

## 2018-08-01 DIAGNOSIS — N401 Enlarged prostate with lower urinary tract symptoms: Secondary | ICD-10-CM | POA: Diagnosis not present

## 2018-08-01 NOTE — Progress Notes (Signed)
Office notes faxed to [facility] at [phone] Valarie Cones, LPN

## 2018-08-02 ENCOUNTER — Inpatient Hospital Stay: Payer: Medicare PPO | Admitting: Internal Medicine

## 2018-08-02 DIAGNOSIS — G4733 Obstructive sleep apnea (adult) (pediatric): Secondary | ICD-10-CM | POA: Diagnosis not present

## 2018-08-02 DIAGNOSIS — R338 Other retention of urine: Secondary | ICD-10-CM | POA: Diagnosis not present

## 2018-08-02 DIAGNOSIS — H3530 Unspecified macular degeneration: Secondary | ICD-10-CM | POA: Diagnosis not present

## 2018-08-02 DIAGNOSIS — I10 Essential (primary) hypertension: Secondary | ICD-10-CM | POA: Diagnosis not present

## 2018-08-02 DIAGNOSIS — Z466 Encounter for fitting and adjustment of urinary device: Secondary | ICD-10-CM | POA: Diagnosis not present

## 2018-08-02 DIAGNOSIS — L89152 Pressure ulcer of sacral region, stage 2: Secondary | ICD-10-CM | POA: Diagnosis not present

## 2018-08-02 DIAGNOSIS — N401 Enlarged prostate with lower urinary tract symptoms: Secondary | ICD-10-CM | POA: Diagnosis not present

## 2018-08-02 DIAGNOSIS — G629 Polyneuropathy, unspecified: Secondary | ICD-10-CM | POA: Diagnosis not present

## 2018-08-02 DIAGNOSIS — N138 Other obstructive and reflux uropathy: Secondary | ICD-10-CM | POA: Diagnosis not present

## 2018-08-02 DIAGNOSIS — R3915 Urgency of urination: Secondary | ICD-10-CM | POA: Diagnosis not present

## 2018-08-02 DIAGNOSIS — N3941 Urge incontinence: Secondary | ICD-10-CM | POA: Diagnosis not present

## 2018-08-03 ENCOUNTER — Ambulatory Visit: Payer: Medicare PPO | Admitting: Internal Medicine

## 2018-08-03 ENCOUNTER — Other Ambulatory Visit: Payer: Self-pay

## 2018-08-03 ENCOUNTER — Encounter: Payer: Self-pay | Admitting: Internal Medicine

## 2018-08-03 VITALS — BP 128/78 | HR 93 | Temp 97.4°F | Ht 68.0 in | Wt 287.0 lb

## 2018-08-03 DIAGNOSIS — N401 Enlarged prostate with lower urinary tract symptoms: Secondary | ICD-10-CM | POA: Diagnosis not present

## 2018-08-03 DIAGNOSIS — H3530 Unspecified macular degeneration: Secondary | ICD-10-CM | POA: Diagnosis not present

## 2018-08-03 DIAGNOSIS — G629 Polyneuropathy, unspecified: Secondary | ICD-10-CM | POA: Diagnosis not present

## 2018-08-03 DIAGNOSIS — K750 Abscess of liver: Secondary | ICD-10-CM | POA: Diagnosis not present

## 2018-08-03 DIAGNOSIS — L89152 Pressure ulcer of sacral region, stage 2: Secondary | ICD-10-CM | POA: Diagnosis not present

## 2018-08-03 DIAGNOSIS — N138 Other obstructive and reflux uropathy: Secondary | ICD-10-CM | POA: Diagnosis not present

## 2018-08-03 DIAGNOSIS — Z466 Encounter for fitting and adjustment of urinary device: Secondary | ICD-10-CM | POA: Diagnosis not present

## 2018-08-03 DIAGNOSIS — G4733 Obstructive sleep apnea (adult) (pediatric): Secondary | ICD-10-CM | POA: Diagnosis not present

## 2018-08-03 DIAGNOSIS — I10 Essential (primary) hypertension: Secondary | ICD-10-CM | POA: Diagnosis not present

## 2018-08-03 DIAGNOSIS — R32 Unspecified urinary incontinence: Secondary | ICD-10-CM | POA: Diagnosis not present

## 2018-08-03 DIAGNOSIS — R338 Other retention of urine: Secondary | ICD-10-CM | POA: Diagnosis not present

## 2018-08-03 DIAGNOSIS — B356 Tinea cruris: Secondary | ICD-10-CM | POA: Diagnosis not present

## 2018-08-03 MED ORDER — NYSTATIN 100000 UNIT/GM EX POWD: Freq: Two times a day (BID) | CUTANEOUS | 5 refills | Status: DC

## 2018-08-03 MED ORDER — FLUCONAZOLE 100 MG PO TABS: 100.0000 mg | ORAL_TABLET | Freq: Every day | ORAL | 3 refills | Status: DC

## 2018-08-03 NOTE — Assessment & Plan Note (Signed)
This is resolved Antibiotics done PICC out Getting rehab ---expect several months to get back to former energy level Hold off on driving for now

## 2018-08-03 NOTE — Assessment & Plan Note (Signed)
BP Readings from Last 3 Encounters:  08/03/18 128/78  07/27/18 117/77  07/08/18 (!) 142/81   Good control now especially with weight loss

## 2018-08-03 NOTE — Assessment & Plan Note (Signed)
Fairly severe Will treat with fluconazole and nystatin topical

## 2018-08-03 NOTE — Assessment & Plan Note (Signed)
May be more OAB than prostate On myrbetriq for 1 day Catheter is out now Will need to be monitored for urinary retention

## 2018-08-03 NOTE — Progress Notes (Signed)
Subjective:    Patient ID: Brad Reed, male    DOB: 06/30/1934, 84 y.o.   MRN: [id number]  HPI Here for follow up of hospitalization and rehab stay for hepatic abscess With DIL  Had hepatic abscess CT showed resolution so drain pulled 2 weeks ago PICC line then removed Concern that this could be related to poor dental care---is being set up for routine care now No fever now Feels good  Had considerable skin breakdown due to urinary incontinence Just leaking and wasn't able to clean himself before Needed catheter for extended time--from hospital stay through rehab Did see the urologist yesterday Vernie Ammons) Removed the catheter  Had started tamsulosin but Dr Vernie Ammons stopped it and put him on myrbetriq Had been having nocturia 2-3 times before the hospital--and not able to control Got up once last night--but flow not that great (still some leakage)  Had been using miconazole powder while in rehab--but didn't go home on it Still has stage 2 sacral ulcer per his DIL  Current Outpatient Medications on File Prior to Visit  Medication Sig Dispense Refill  . acetaminophen (TYLENOL) 325 MG tablet Take 2 tablets (650 mg total) by mouth every 6 (six) hours as needed for mild pain or moderate pain (or Fever >/= 101).    Marland Kitchen acidophilus (RISAQUAD) CAPS capsule Take 2 capsules by mouth daily.    . mirabegron ER (MYRBETRIQ) 25 MG TB24 tablet Take 25 mg by mouth daily.    . multivitamin-lutein (OCUVITE-LUTEIN) CAPS capsule Take 1 capsule by mouth daily.    Marland Kitchen omeprazole (PRILOSEC) 20 MG capsule Take 1 capsule (20 mg total) by mouth 2 (two) times daily before a meal. 60 capsule 3  . ondansetron (ZOFRAN) 4 MG tablet Take 1 tablet (4 mg total) by mouth every 6 (six) hours as needed for nausea. 20 tablet 0  . potassium chloride (K-DUR) 10 MEQ tablet TAKE 1 TABLET (10 MEQ TOTAL) BY MOUTH 2 (TWO) TIMES DAILY. (Patient taking differently: Take 10 mEq by mouth daily. ) 180 tablet 2  . saccharomyces  boulardii (FLORASTOR) 250 MG capsule Take 1 capsule (250 mg total) by mouth 2 (two) times daily.    Marland Kitchen triamterene-hydrochlorothiazide (DYAZIDE) 37.5-25 MG capsule TAKE ONE CAPSULE BY MOUTH EVERY MORNING (Patient taking differently: Take 1 capsule by mouth daily. ) 90 capsule 3   No current facility-administered medications on file prior to visit.     Allergies  Allergen Reactions  . Ampicillin Hives    Past Medical History:  Diagnosis Date  . Angiodysplasia 02/13/2006  . Arthritis    knees  . Benign prostatic hypertrophy   . Colon polyps   . Diverticulosis of colon   . Hypertension   . Macular degeneration, wet ([facility])    Dr Chyrl Civatte  . Obesity   . Peripheral neuropathy    feet  . Sleep apnea    DX'd when younger. declined CPAP.    Past Surgical History:  Procedure Laterality Date  . APPENDECTOMY    . CATARACT EXTRACTION W/PHACO Left 03/05/2018   Procedure: CATARACT EXTRACTION PHACO AND INTRAOCULAR LENS PLACEMENT ([facility]) LEFT;  Surgeon: Nevada Crane, MD;  Location: [facility];  Service: Ophthalmology;  Laterality: Left;  sleep apnea  . CATARACT EXTRACTION W/PHACO Right 03/19/2018   Procedure: CATARACT EXTRACTION PHACO AND INTRAOCULAR LENS PLACEMENT ([facility]);  Surgeon: Nevada Crane, MD;  Location: [facility];  Service: Ophthalmology;  Laterality: Right;  . IR GUIDED DRAIN W CATHETER PLACEMENT  07/03/2018  .  IR RADIOLOGIST EVAL & MGMT  07/18/2018  . KNEE ARTHROSCOPY     BOTH KNEES  . MASTOID DEBRIDEMENT    . PILONIDAL CYST EXCISION  1960  . TONSILLECTOMY      Family History  Problem Relation Age of Onset  . Cancer Mother   . Obesity Sister   . Diabetes Sister   . Diabetes Maternal Uncle     Social History   Socioeconomic History  . Marital status: Widowed    Spouse name: Not on file  . Number of children: 3  . Years of education: Not on file  . Highest education level: Not on file  Occupational History  . 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 Financial resource strain: Not on file  . Food insecurity:    Worry: Not on file    Inability: Not on file  . Transportation needs:    Medical: Not on file    Non-medical: Not on file  Tobacco Use  . Smoking status: Former Smoker    Types: Cigarettes    Last attempt to quit: 05/17/1983    Years since quitting: 35.2  . Smokeless tobacco: Never Used  Substance and Sexual Activity  . Alcohol use: Yes    Alcohol/week: 14.0 standard drinks    Types: 14 Shots of liquor per week    Comment: Drinks 2 cocktails /day  . Drug use: No  . Sexual activity: Not on file  Lifestyle  . Physical activity:    Days per week: Not on file    Minutes per session: Not on file  . Stress: Not on file  Relationships  . Social connections:    Talks on phone: Not on file    Gets together: Not on file    Attends religious service: Not on file    Active member of club or organization: Not on file    Attends meetings of clubs or organizations: Not on file    Relationship status: Not on file  . Intimate partner violence:    Fear of current or ex partner: Not on file    Emotionally abused: Not on file    Physically abused: Not on file    Forced sexual activity: Not on file  Other Topics Concern  . Not on file  Social History Narrative   Has living will    Son [name] is health care POA   Would accept resuscitation but no prolonged artificial life support   No tube feeds if cognitively unaware   Review of Systems Appetite is better now--eating every meal Hasn't had any alcohol since being sick---understands he needs to use less Walking without walker now Sleeps okay Breathing is still short when he pushes it---may be some better since he was hospitalized Weight is down ~30# or so with the illness    Objective:   Physical Exam  Constitutional: No distress.  Neck: No thyromegaly present.  Cardiovascular: Normal rate,  regular rhythm and normal heart sounds. Exam reveals no gallop.  No murmur heard. Respiratory: Effort normal and breath sounds normal. No respiratory distress. He has no wheezes. He has no rales.  GI: Soft. He exhibits no distension. There is no abdominal tenderness. There is no rebound and no guarding.  Musculoskeletal:        General: No edema.  Lymphadenopathy:    He has no cervical adenopathy.  Skin:  Marked fungal intertrigo in left groin Redness in  between buttock cheeks with slight abrading in pilonidal area           Assessment & Plan:

## 2018-08-06 DIAGNOSIS — N401 Enlarged prostate with lower urinary tract symptoms: Secondary | ICD-10-CM | POA: Diagnosis not present

## 2018-08-06 DIAGNOSIS — H3530 Unspecified macular degeneration: Secondary | ICD-10-CM | POA: Diagnosis not present

## 2018-08-06 DIAGNOSIS — I10 Essential (primary) hypertension: Secondary | ICD-10-CM | POA: Diagnosis not present

## 2018-08-06 DIAGNOSIS — N138 Other obstructive and reflux uropathy: Secondary | ICD-10-CM | POA: Diagnosis not present

## 2018-08-06 DIAGNOSIS — L89152 Pressure ulcer of sacral region, stage 2: Secondary | ICD-10-CM | POA: Diagnosis not present

## 2018-08-06 DIAGNOSIS — G4733 Obstructive sleep apnea (adult) (pediatric): Secondary | ICD-10-CM | POA: Diagnosis not present

## 2018-08-06 DIAGNOSIS — R338 Other retention of urine: Secondary | ICD-10-CM | POA: Diagnosis not present

## 2018-08-06 DIAGNOSIS — G629 Polyneuropathy, unspecified: Secondary | ICD-10-CM | POA: Diagnosis not present

## 2018-08-06 DIAGNOSIS — Z466 Encounter for fitting and adjustment of urinary device: Secondary | ICD-10-CM | POA: Diagnosis not present

## 2018-08-07 DIAGNOSIS — G4733 Obstructive sleep apnea (adult) (pediatric): Secondary | ICD-10-CM | POA: Diagnosis not present

## 2018-08-07 DIAGNOSIS — L89152 Pressure ulcer of sacral region, stage 2: Secondary | ICD-10-CM | POA: Diagnosis not present

## 2018-08-07 DIAGNOSIS — G629 Polyneuropathy, unspecified: Secondary | ICD-10-CM | POA: Diagnosis not present

## 2018-08-07 DIAGNOSIS — R338 Other retention of urine: Secondary | ICD-10-CM | POA: Diagnosis not present

## 2018-08-07 DIAGNOSIS — N401 Enlarged prostate with lower urinary tract symptoms: Secondary | ICD-10-CM | POA: Diagnosis not present

## 2018-08-07 DIAGNOSIS — H3530 Unspecified macular degeneration: Secondary | ICD-10-CM | POA: Diagnosis not present

## 2018-08-07 DIAGNOSIS — N138 Other obstructive and reflux uropathy: Secondary | ICD-10-CM | POA: Diagnosis not present

## 2018-08-07 DIAGNOSIS — I10 Essential (primary) hypertension: Secondary | ICD-10-CM | POA: Diagnosis not present

## 2018-08-07 DIAGNOSIS — Z466 Encounter for fitting and adjustment of urinary device: Secondary | ICD-10-CM | POA: Diagnosis not present

## 2018-08-08 DIAGNOSIS — N401 Enlarged prostate with lower urinary tract symptoms: Secondary | ICD-10-CM | POA: Diagnosis not present

## 2018-08-08 DIAGNOSIS — N138 Other obstructive and reflux uropathy: Secondary | ICD-10-CM | POA: Diagnosis not present

## 2018-08-08 DIAGNOSIS — R338 Other retention of urine: Secondary | ICD-10-CM | POA: Diagnosis not present

## 2018-08-08 DIAGNOSIS — L89152 Pressure ulcer of sacral region, stage 2: Secondary | ICD-10-CM | POA: Diagnosis not present

## 2018-08-08 DIAGNOSIS — Z466 Encounter for fitting and adjustment of urinary device: Secondary | ICD-10-CM | POA: Diagnosis not present

## 2018-08-09 DIAGNOSIS — I10 Essential (primary) hypertension: Secondary | ICD-10-CM | POA: Diagnosis not present

## 2018-08-09 DIAGNOSIS — G4733 Obstructive sleep apnea (adult) (pediatric): Secondary | ICD-10-CM | POA: Diagnosis not present

## 2018-08-09 DIAGNOSIS — R338 Other retention of urine: Secondary | ICD-10-CM | POA: Diagnosis not present

## 2018-08-09 DIAGNOSIS — H3530 Unspecified macular degeneration: Secondary | ICD-10-CM | POA: Diagnosis not present

## 2018-08-09 DIAGNOSIS — Z466 Encounter for fitting and adjustment of urinary device: Secondary | ICD-10-CM | POA: Diagnosis not present

## 2018-08-09 DIAGNOSIS — N138 Other obstructive and reflux uropathy: Secondary | ICD-10-CM | POA: Diagnosis not present

## 2018-08-09 DIAGNOSIS — G629 Polyneuropathy, unspecified: Secondary | ICD-10-CM | POA: Diagnosis not present

## 2018-08-09 DIAGNOSIS — N401 Enlarged prostate with lower urinary tract symptoms: Secondary | ICD-10-CM | POA: Diagnosis not present

## 2018-08-09 DIAGNOSIS — L89152 Pressure ulcer of sacral region, stage 2: Secondary | ICD-10-CM | POA: Diagnosis not present

## 2018-08-10 DIAGNOSIS — L89152 Pressure ulcer of sacral region, stage 2: Secondary | ICD-10-CM | POA: Diagnosis not present

## 2018-08-10 DIAGNOSIS — N138 Other obstructive and reflux uropathy: Secondary | ICD-10-CM | POA: Diagnosis not present

## 2018-08-10 DIAGNOSIS — I10 Essential (primary) hypertension: Secondary | ICD-10-CM | POA: Diagnosis not present

## 2018-08-10 DIAGNOSIS — R338 Other retention of urine: Secondary | ICD-10-CM | POA: Diagnosis not present

## 2018-08-10 DIAGNOSIS — Z466 Encounter for fitting and adjustment of urinary device: Secondary | ICD-10-CM | POA: Diagnosis not present

## 2018-08-10 DIAGNOSIS — G4733 Obstructive sleep apnea (adult) (pediatric): Secondary | ICD-10-CM | POA: Diagnosis not present

## 2018-08-10 DIAGNOSIS — N401 Enlarged prostate with lower urinary tract symptoms: Secondary | ICD-10-CM | POA: Diagnosis not present

## 2018-08-10 DIAGNOSIS — H3530 Unspecified macular degeneration: Secondary | ICD-10-CM | POA: Diagnosis not present

## 2018-08-10 DIAGNOSIS — G629 Polyneuropathy, unspecified: Secondary | ICD-10-CM | POA: Diagnosis not present

## 2018-08-13 DIAGNOSIS — I10 Essential (primary) hypertension: Secondary | ICD-10-CM | POA: Diagnosis not present

## 2018-08-13 DIAGNOSIS — H3530 Unspecified macular degeneration: Secondary | ICD-10-CM | POA: Diagnosis not present

## 2018-08-13 DIAGNOSIS — N138 Other obstructive and reflux uropathy: Secondary | ICD-10-CM | POA: Diagnosis not present

## 2018-08-13 DIAGNOSIS — L89152 Pressure ulcer of sacral region, stage 2: Secondary | ICD-10-CM | POA: Diagnosis not present

## 2018-08-13 DIAGNOSIS — N401 Enlarged prostate with lower urinary tract symptoms: Secondary | ICD-10-CM | POA: Diagnosis not present

## 2018-08-13 DIAGNOSIS — G4733 Obstructive sleep apnea (adult) (pediatric): Secondary | ICD-10-CM | POA: Diagnosis not present

## 2018-08-13 DIAGNOSIS — Z466 Encounter for fitting and adjustment of urinary device: Secondary | ICD-10-CM | POA: Diagnosis not present

## 2018-08-13 DIAGNOSIS — R338 Other retention of urine: Secondary | ICD-10-CM | POA: Diagnosis not present

## 2018-08-13 DIAGNOSIS — G629 Polyneuropathy, unspecified: Secondary | ICD-10-CM | POA: Diagnosis not present

## 2018-08-14 DIAGNOSIS — N401 Enlarged prostate with lower urinary tract symptoms: Secondary | ICD-10-CM | POA: Diagnosis not present

## 2018-08-14 DIAGNOSIS — L89152 Pressure ulcer of sacral region, stage 2: Secondary | ICD-10-CM | POA: Diagnosis not present

## 2018-08-14 DIAGNOSIS — G4733 Obstructive sleep apnea (adult) (pediatric): Secondary | ICD-10-CM | POA: Diagnosis not present

## 2018-08-14 DIAGNOSIS — Z466 Encounter for fitting and adjustment of urinary device: Secondary | ICD-10-CM | POA: Diagnosis not present

## 2018-08-14 DIAGNOSIS — N138 Other obstructive and reflux uropathy: Secondary | ICD-10-CM | POA: Diagnosis not present

## 2018-08-14 DIAGNOSIS — I10 Essential (primary) hypertension: Secondary | ICD-10-CM | POA: Diagnosis not present

## 2018-08-14 DIAGNOSIS — G629 Polyneuropathy, unspecified: Secondary | ICD-10-CM | POA: Diagnosis not present

## 2018-08-14 DIAGNOSIS — R338 Other retention of urine: Secondary | ICD-10-CM | POA: Diagnosis not present

## 2018-08-14 DIAGNOSIS — H3530 Unspecified macular degeneration: Secondary | ICD-10-CM | POA: Diagnosis not present

## 2018-08-15 DIAGNOSIS — N138 Other obstructive and reflux uropathy: Secondary | ICD-10-CM | POA: Diagnosis not present

## 2018-08-15 DIAGNOSIS — Z466 Encounter for fitting and adjustment of urinary device: Secondary | ICD-10-CM | POA: Diagnosis not present

## 2018-08-15 DIAGNOSIS — L89152 Pressure ulcer of sacral region, stage 2: Secondary | ICD-10-CM | POA: Diagnosis not present

## 2018-08-15 DIAGNOSIS — N401 Enlarged prostate with lower urinary tract symptoms: Secondary | ICD-10-CM | POA: Diagnosis not present

## 2018-08-15 DIAGNOSIS — R338 Other retention of urine: Secondary | ICD-10-CM | POA: Diagnosis not present

## 2018-08-15 DIAGNOSIS — H3530 Unspecified macular degeneration: Secondary | ICD-10-CM | POA: Diagnosis not present

## 2018-08-15 DIAGNOSIS — G4733 Obstructive sleep apnea (adult) (pediatric): Secondary | ICD-10-CM | POA: Diagnosis not present

## 2018-08-15 DIAGNOSIS — I10 Essential (primary) hypertension: Secondary | ICD-10-CM | POA: Diagnosis not present

## 2018-08-15 DIAGNOSIS — G629 Polyneuropathy, unspecified: Secondary | ICD-10-CM | POA: Diagnosis not present

## 2018-08-17 DIAGNOSIS — H3530 Unspecified macular degeneration: Secondary | ICD-10-CM | POA: Diagnosis not present

## 2018-08-17 DIAGNOSIS — G629 Polyneuropathy, unspecified: Secondary | ICD-10-CM | POA: Diagnosis not present

## 2018-08-17 DIAGNOSIS — R338 Other retention of urine: Secondary | ICD-10-CM | POA: Diagnosis not present

## 2018-08-17 DIAGNOSIS — G4733 Obstructive sleep apnea (adult) (pediatric): Secondary | ICD-10-CM | POA: Diagnosis not present

## 2018-08-17 DIAGNOSIS — I10 Essential (primary) hypertension: Secondary | ICD-10-CM | POA: Diagnosis not present

## 2018-08-17 DIAGNOSIS — N138 Other obstructive and reflux uropathy: Secondary | ICD-10-CM | POA: Diagnosis not present

## 2018-08-17 DIAGNOSIS — L89152 Pressure ulcer of sacral region, stage 2: Secondary | ICD-10-CM | POA: Diagnosis not present

## 2018-08-17 DIAGNOSIS — N401 Enlarged prostate with lower urinary tract symptoms: Secondary | ICD-10-CM | POA: Diagnosis not present

## 2018-08-17 DIAGNOSIS — Z466 Encounter for fitting and adjustment of urinary device: Secondary | ICD-10-CM | POA: Diagnosis not present

## 2018-08-20 ENCOUNTER — Ambulatory Visit: Payer: Medicare Other | Admitting: Podiatry

## 2018-08-21 DIAGNOSIS — N138 Other obstructive and reflux uropathy: Secondary | ICD-10-CM | POA: Diagnosis not present

## 2018-08-21 DIAGNOSIS — N401 Enlarged prostate with lower urinary tract symptoms: Secondary | ICD-10-CM | POA: Diagnosis not present

## 2018-08-21 DIAGNOSIS — G629 Polyneuropathy, unspecified: Secondary | ICD-10-CM | POA: Diagnosis not present

## 2018-08-21 DIAGNOSIS — G4733 Obstructive sleep apnea (adult) (pediatric): Secondary | ICD-10-CM | POA: Diagnosis not present

## 2018-08-21 DIAGNOSIS — I10 Essential (primary) hypertension: Secondary | ICD-10-CM | POA: Diagnosis not present

## 2018-08-21 DIAGNOSIS — H3530 Unspecified macular degeneration: Secondary | ICD-10-CM | POA: Diagnosis not present

## 2018-08-21 DIAGNOSIS — L89152 Pressure ulcer of sacral region, stage 2: Secondary | ICD-10-CM | POA: Diagnosis not present

## 2018-08-21 DIAGNOSIS — Z466 Encounter for fitting and adjustment of urinary device: Secondary | ICD-10-CM | POA: Diagnosis not present

## 2018-08-21 DIAGNOSIS — R338 Other retention of urine: Secondary | ICD-10-CM | POA: Diagnosis not present

## 2018-08-23 DIAGNOSIS — H353221 Exudative age-related macular degeneration, left eye, with active choroidal neovascularization: Secondary | ICD-10-CM | POA: Diagnosis not present

## 2018-09-11 ENCOUNTER — Other Ambulatory Visit: Payer: Self-pay

## 2018-09-11 ENCOUNTER — Ambulatory Visit: Payer: Medicare PPO | Admitting: Internal Medicine

## 2018-09-11 ENCOUNTER — Encounter: Payer: Self-pay | Admitting: Internal Medicine

## 2018-09-11 VITALS — BP 126/86 | HR 107 | Temp 97.4°F | Ht 68.0 in | Wt 288.0 lb

## 2018-09-11 DIAGNOSIS — H9193 Unspecified hearing loss, bilateral: Secondary | ICD-10-CM

## 2018-09-11 DIAGNOSIS — R32 Unspecified urinary incontinence: Secondary | ICD-10-CM | POA: Diagnosis not present

## 2018-09-11 DIAGNOSIS — B356 Tinea cruris: Secondary | ICD-10-CM | POA: Diagnosis not present

## 2018-09-11 DIAGNOSIS — R413 Other amnesia: Secondary | ICD-10-CM

## 2018-09-11 DIAGNOSIS — H9190 Unspecified hearing loss, unspecified ear: Secondary | ICD-10-CM | POA: Insufficient documentation

## 2018-09-11 LAB — COMPREHENSIVE METABOLIC PANEL
ALT: 15 U/L (ref 0–53)
AST: 21 U/L (ref 0–37)
Albumin: 3.8 g/dL (ref 3.5–5.2)
Alkaline Phosphatase: 52 U/L (ref 39–117)
BUN: 10 mg/dL (ref 6–23)
CO2: 31 mEq/L (ref 19–32)
Calcium: 9.5 mg/dL (ref 8.4–10.5)
Chloride: 98 mEq/L (ref 96–112)
Creatinine, Ser: 1.02 mg/dL (ref 0.40–1.50)
GFR: 69.94 mL/min (ref >60.00)
Glucose, Bld: 113 mg/dL — ABNORMAL HIGH (ref 70–99)
Potassium: 3.6 mEq/L (ref 3.5–5.1)
Sodium: 137 mEq/L (ref 135–145)
Total Bilirubin: 0.9 mg/dL (ref 0.2–1.2)
Total Protein: 7.2 g/dL (ref 6.0–8.3)

## 2018-09-11 LAB — CBC
HCT: 47.7 % (ref 39.0–52.0)
Hemoglobin: 16.1 g/dL (ref 13.0–17.0)
MCHC: 33.7 g/dL (ref 30.0–36.0)
MCV: 96 fl (ref 78.0–100.0)
Platelets: 221 10*3/uL (ref 150.0–400.0)
RBC: 4.97 Mil/uL (ref 4.22–5.81)
RDW: 15 % (ref 11.5–15.5)
WBC: 9.6 10*3/uL (ref 4.0–10.5)

## 2018-09-11 LAB — SEDIMENTATION RATE: Sed Rate: 22 mm/hr — ABNORMAL HIGH (ref 0–20)

## 2018-09-11 LAB — T4, FREE: Free T4: 0.83 ng/dL (ref 0.60–1.60)

## 2018-09-11 LAB — VITAMIN B12: Vitamin B-12: 232 pg/mL (ref 211–911)

## 2018-09-11 MED ORDER — TRIAMTERENE-HCTZ 37.5-25 MG PO CAPS: 1.0000 | ORAL_CAPSULE | Freq: Every day | ORAL | 3 refills | Status: AC

## 2018-09-11 MED ORDER — OMEPRAZOLE 20 MG PO CPDR: 20.0000 mg | DELAYED_RELEASE_CAPSULE | Freq: Two times a day (BID) | ORAL | 3 refills | Status: DC

## 2018-09-11 MED ORDER — POTASSIUM CHLORIDE ER 10 MEQ PO TBCR: 10.0000 meq | EXTENDED_RELEASE_TABLET | Freq: Every day | ORAL | 3 refills | Status: DC

## 2018-09-11 NOTE — Patient Instructions (Signed)
You may drive ---but only locally and not faster than 45 MPH. You must get a hearing evaluation---a lot of your memory problems are due to not hearing well. You could try over the counter ditropan for your bladder, instead of mirabegron---but be careful for side effects like worsened memory, dizziness, constipation. You could look into some type of medication assistance.

## 2018-09-11 NOTE — Assessment & Plan Note (Signed)
Really needs ENT and formal audiologic evaluation

## 2018-09-11 NOTE — Assessment & Plan Note (Signed)
Better with the mirabegron--but too much money I am not sure that alternatives are safe for him

## 2018-09-11 NOTE — Assessment & Plan Note (Signed)
Does have cognitive issues MOCA 25/30 Pattern not of Alzheimer's though Likely multifactorial--but hearing loss, lack of concentration, perhaps damage from recent serious illness Will check labs Seems okay to drive--but just locally

## 2018-09-11 NOTE — Assessment & Plan Note (Signed)
Continues with the urinary leakage being chronic

## 2018-09-11 NOTE — Progress Notes (Signed)
Subjective:    Patient ID: Brad Reed, male    DOB: 06/01/1934, 84 y.o.   MRN: [id number]  HPI  Here for follow up of acute illness with hepatic abscess His family is concerned about memory loss He wants my okay to resume driving DIL joins him in the office  Knows there is concern about his memory He has known hearing loss Hasn't been excited about hearing aides  "I don't drive---because my son is a pain in the ass" Feels better He feels the rash on buttocks and in groin is better He feels his right leg strength is better Continues with his leg exercise [place]  Discussed son's concern about is memory Realizes that he got lost driving granddaughter home from school---got lost on [address] Also discussed him forgetting about talking about his dear friend who was sick--with older son---he recalls it all He knows he doesn't listen carefully  Incontinence problems were helped by mirabegron Was shocked at the price though Doesn't take every day  Current Outpatient Medications on File Prior to Visit  Medication Sig Dispense Refill  . acetaminophen (TYLENOL) 325 MG tablet Take 2 tablets (650 mg total) by mouth every 6 (six) hours as needed for mild pain or moderate pain (or Fever >/= 101).    . mirabegron ER (MYRBETRIQ) 25 MG TB24 tablet Take 25 mg by mouth daily.    . multivitamin-lutein (OCUVITE-LUTEIN) CAPS capsule Take 1 capsule by mouth daily.    Marland Kitchen. nystatin (MYCOSTATIN/NYSTOP) powder Apply topically 2 (two) times daily. 60 g 5  . omeprazole (PRILOSEC) 20 MG capsule Take 1 capsule (20 mg total) by mouth 2 (two) times daily before a meal. 60 capsule 3  . potassium chloride (K-DUR) 10 MEQ tablet TAKE 1 TABLET (10 MEQ TOTAL) BY MOUTH 2 (TWO) TIMES DAILY. (Patient taking differently: Take 10 mEq by mouth daily. ) 180 tablet 2  . triamterene-hydrochlorothiazide (DYAZIDE) 37.5-25 MG capsule TAKE ONE CAPSULE BY MOUTH EVERY MORNING (Patient  taking differently: Take 1 capsule by mouth daily. ) 90 capsule 3  . acidophilus (RISAQUAD) CAPS capsule Take 2 capsules by mouth daily.    . ondansetron (ZOFRAN) 4 MG tablet Take 1 tablet (4 mg total) by mouth every 6 (six) hours as needed for nausea. (Patient not taking: Reported on 09/11/2018) 20 tablet 0  . saccharomyces boulardii (FLORASTOR) 250 MG capsule Take 1 capsule (250 mg total) by mouth 2 (two) times daily. (Patient not taking: Reported on 09/11/2018)     No current facility-administered medications on file prior to visit.     Allergies  Allergen Reactions  . Ampicillin Hives    Past Medical History:  Diagnosis Date  . Angiodysplasia 02/13/2006  . Arthritis    knees  . Benign prostatic hypertrophy   . Colon polyps   . Diverticulosis of colon   . Hypertension   . Macular degeneration, wet (HCC)    Dr Chyrl CivatteApenzeller--left  . Obesity   . Peripheral neuropathy    feet  . Sleep apnea    DX'd when younger. declined CPAP.    Past Surgical History:  Procedure Laterality Date  . APPENDECTOMY    . CATARACT EXTRACTION W/PHACO Left 03/05/2018   Procedure: CATARACT EXTRACTION PHACO AND INTRAOCULAR LENS PLACEMENT (IOC) LEFT;  Surgeon: Nevada CraneKing, Bradley Mark, MD;  [facility];  Service: Ophthalmology;  Laterality: Left;  sleep apnea  . CATARACT EXTRACTION W/PHACO Right 03/19/2018   Procedure: CATARACT EXTRACTION PHACO  AND INTRAOCULAR LENS PLACEMENT (IOC);  Surgeon: Nevada Crane, MD;  Location: [facility];  Service: Ophthalmology;  Laterality: Right;  . IR GUIDED DRAIN W CATHETER PLACEMENT  07/03/2018  . IR RADIOLOGIST EVAL & MGMT  07/18/2018  . KNEE ARTHROSCOPY     BOTH KNEES  . MASTOID DEBRIDEMENT    . PILONIDAL CYST EXCISION  1960  . TONSILLECTOMY      Family History  Problem Relation Age of Onset  . Cancer Mother   . Obesity Sister   . Diabetes Sister   . Diabetes Maternal Uncle     Social History   Socioeconomic History  . Marital status:  Widowed    Spouse name: Not on file  . Number of children: 3  . Years of education: Not on file  . Highest education level: Not on file  Occupational History  . 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 Financial resource strain: Not on file  . Food insecurity:    Worry: Not on file    Inability: Not on file  . Transportation needs:    Medical: Not on file    Non-medical: Not on file  Tobacco Use  . Smoking status: Former Smoker    Types: Cigarettes    Last attempt to quit: 05/17/1983    Years since quitting: 35.3  . Smokeless tobacco: Never Used  Substance and Sexual Activity  . Alcohol use: Yes    Alcohol/week: 14.0 standard drinks    Types: 14 Shots of liquor per week    Comment: Drinks 2 cocktails /day  . Drug use: No  . Sexual activity: Not on file  Lifestyle  . Physical activity:    Days per week: Not on file    Minutes per session: Not on file  . Stress: Not on file  Relationships  . Social connections:    Talks on phone: Not on file    Gets together: Not on file    Attends religious service: Not on file    Active member of club or organization: Not on file    Attends meetings of clubs or organizations: Not on file    Relationship status: Not on file  . Intimate partner violence:    Fear of current or ex partner: Not on file    Emotionally abused: Not on file    Physically abused: Not on file    Forced sexual activity: Not on file  Other Topics Concern  . Not on file  Social History Narrative   Has living will    Son [name] is health care POA   Would accept resuscitation but no prolonged artificial life support   No tube feeds if cognitively unaware    Review of Systems Appetite is good No abdominal pain No fevers Not really depressed    Objective:   Physical Exam  Constitutional: He appears well-developed. No distress.  HENT:  Cerumen on left  Neurological:  Normal gait No leg weakness   Skin:  Buttocks mostly healed Still with groin intertrigo but no worse  Psychiatric:  No depression or anxiety Affect is appropriate           Assessment & Plan:

## 2018-09-12 ENCOUNTER — Telehealth: Payer: Self-pay | Admitting: Internal Medicine

## 2018-09-12 NOTE — Telephone Encounter (Signed)
I left a message for patient to r/s AWV appt on 09/19/18 to the end of June.  I found an appointment on 11/12/18 at 12:00.

## 2018-09-13 ENCOUNTER — Telehealth: Payer: Self-pay

## 2018-09-13 DIAGNOSIS — R8279 Other abnormal findings on microbiological examination of urine: Secondary | ICD-10-CM | POA: Diagnosis not present

## 2018-09-13 DIAGNOSIS — N3941 Urge incontinence: Secondary | ICD-10-CM | POA: Diagnosis not present

## 2018-09-13 DIAGNOSIS — N401 Enlarged prostate with lower urinary tract symptoms: Secondary | ICD-10-CM | POA: Diagnosis not present

## 2018-09-13 MED ORDER — POTASSIUM CHLORIDE ER 10 MEQ PO TBCR: 10.0000 meq | EXTENDED_RELEASE_TABLET | Freq: Every day | ORAL | 3 refills | Status: DC

## 2018-09-13 NOTE — Telephone Encounter (Signed)
[facility] left v/m requesting status of faxed clarification request for KcL ER 10 meq micro tabs. Med had 2 sets of instructions: take one tab daily and take 1 tab bid. Also request review of qty prescribed.

## 2018-09-13 NOTE — Telephone Encounter (Signed)
Please check what he has been taking and continue that His potassium level was okay on his blood work just done

## 2018-09-13 NOTE — Telephone Encounter (Signed)
Corrected rx to say 1 by mount daily. #90 as it was set for.

## 2018-09-19 ENCOUNTER — Encounter: Payer: Medicare Other | Admitting: Internal Medicine

## 2018-09-19 ENCOUNTER — Telehealth: Payer: Self-pay | Admitting: Internal Medicine

## 2018-09-19 NOTE — Telephone Encounter (Signed)
I left a message on patient's voice mail and Pat,sister,voice mail to call back and cancel 09/19/18 appointment and rescheduled AWV to the end of June.

## 2018-10-01 DIAGNOSIS — H6123 Impacted cerumen, bilateral: Secondary | ICD-10-CM | POA: Diagnosis not present

## 2018-10-01 DIAGNOSIS — H903 Sensorineural hearing loss, bilateral: Secondary | ICD-10-CM | POA: Insufficient documentation

## 2018-10-11 DIAGNOSIS — H353221 Exudative age-related macular degeneration, left eye, with active choroidal neovascularization: Secondary | ICD-10-CM | POA: Diagnosis not present

## 2018-10-15 ENCOUNTER — Encounter: Payer: Self-pay | Admitting: Podiatry

## 2018-10-15 ENCOUNTER — Ambulatory Visit (INDEPENDENT_AMBULATORY_CARE_PROVIDER_SITE_OTHER): Payer: Medicare PPO | Admitting: Podiatry

## 2018-10-15 ENCOUNTER — Other Ambulatory Visit: Payer: Self-pay

## 2018-10-15 VITALS — Temp 98.0°F

## 2018-10-15 DIAGNOSIS — G629 Polyneuropathy, unspecified: Secondary | ICD-10-CM | POA: Diagnosis not present

## 2018-10-15 DIAGNOSIS — M79676 Pain in unspecified toe(s): Secondary | ICD-10-CM

## 2018-10-15 DIAGNOSIS — B351 Tinea unguium: Secondary | ICD-10-CM | POA: Diagnosis not present

## 2018-10-15 NOTE — Progress Notes (Signed)
Complaint:  Visit Type: Patient returns to my office for continued preventative foot care services. Complaint: Patient states" my nails have grown long and thick and become painful to walk and wear shoes" Patient has been diagnosed pre-diabetic with neuropathy. The patient presents for preventative foot care services. Patient has been hospitalized with a liver infection.  Podiatric Exam: Vascular: dorsalis pedis  pulses are palpable bilateral.  PT pulses are non palpable  B/L.  Excessive swelling  B/L   Capillary return is immediate. Temperature gradient is WNL. Skin turgor WNL  Sensorium: Diminished  Semmes Weinstein monofilament test. Normal tactile sensation bilaterally. Nail Exam: Pt has thick disfigured discolored nails with subungual debris noted bilateral entire nail hallux through fifth toenails Ulcer Exam: There is no evidence of ulcer or pre-ulcerative changes or infection. Orthopedic Exam: Muscle tone and strength are WNL. No limitations in general ROM. No crepitus or effusions noted. Foot type and digits show no abnormalities. Bony prominences are unremarkable. Skin: No Porokeratosis. No infection or ulcers  Diagnosis:  Onychomycosis, , Pain in right toe, pain in left toes  Treatment & Plan Procedures and Treatment: Consent by patient was obtained for treatment procedures. The patient understood the discussion of treatment and procedures well. All questions were answered thoroughly reviewed. Debridement of mycotic and hypertrophic toenails, 1 through 5 bilateral and clearing of subungual debris. No ulceration, no infection noted.  Return Visit-Office Procedure: Patient instructed to return to the office for a follow up visit 3 months for continued evaluation and treatment.    Stormee Duda DPM 

## 2018-11-12 ENCOUNTER — Encounter: Payer: Medicare PPO | Admitting: Internal Medicine

## 2018-11-29 DIAGNOSIS — H353221 Exudative age-related macular degeneration, left eye, with active choroidal neovascularization: Secondary | ICD-10-CM | POA: Diagnosis not present

## 2019-01-14 ENCOUNTER — Ambulatory Visit: Payer: Medicare PPO | Admitting: Podiatry

## 2019-01-16 DIAGNOSIS — H353221 Exudative age-related macular degeneration, left eye, with active choroidal neovascularization: Secondary | ICD-10-CM | POA: Diagnosis not present

## 2019-02-14 ENCOUNTER — Ambulatory Visit: Payer: Medicare PPO

## 2019-03-04 ENCOUNTER — Other Ambulatory Visit: Payer: Self-pay

## 2019-03-04 ENCOUNTER — Encounter: Payer: Self-pay | Admitting: Podiatry

## 2019-03-04 ENCOUNTER — Ambulatory Visit: Payer: Medicare PPO | Admitting: Podiatry

## 2019-03-04 DIAGNOSIS — M79676 Pain in unspecified toe(s): Secondary | ICD-10-CM

## 2019-03-04 DIAGNOSIS — G629 Polyneuropathy, unspecified: Secondary | ICD-10-CM | POA: Diagnosis not present

## 2019-03-04 DIAGNOSIS — B351 Tinea unguium: Secondary | ICD-10-CM

## 2019-03-04 NOTE — Progress Notes (Signed)
Complaint:  Visit Type: Patient returns to my office for continued preventative foot care services. Complaint: Patient states" my nails have grown long and thick and become painful to walk and wear shoes" Patient has been diagnosed pre-diabetic with neuropathy. The patient presents for preventative foot care services. Patient has been hospitalized with a liver infection.  Podiatric Exam: Vascular: dorsalis pedis  pulses are palpable bilateral.  PT pulses are non palpable  B/L.  Excessive swelling  B/L   Capillary return is immediate. Temperature gradient is WNL. Skin turgor WNL  Sensorium: Diminished  Semmes Weinstein monofilament test. Normal tactile sensation bilaterally. Nail Exam: Pt has thick disfigured discolored nails with subungual debris noted bilateral entire nail hallux through fifth toenails Ulcer Exam: There is no evidence of ulcer or pre-ulcerative changes or infection. Orthopedic Exam: Muscle tone and strength are WNL. No limitations in general ROM. No crepitus or effusions noted. Foot type and digits show no abnormalities. Bony prominences are unremarkable. Skin: No Porokeratosis. No infection or ulcers  Diagnosis:  Onychomycosis, , Pain in right toe, pain in left toes  Treatment & Plan Procedures and Treatment: Consent by patient was obtained for treatment procedures. The patient understood the discussion of treatment and procedures well. All questions were answered thoroughly reviewed. Debridement of mycotic and hypertrophic toenails, 1 through 5 bilateral and clearing of subungual debris. No ulceration, no infection noted.  Return Visit-Office Procedure: Patient instructed to return to the office for a follow up visit 3 months for continued evaluation and treatment.    Gardiner Barefoot DPM

## 2019-03-05 ENCOUNTER — Ambulatory Visit (INDEPENDENT_AMBULATORY_CARE_PROVIDER_SITE_OTHER): Payer: Medicare PPO

## 2019-03-05 DIAGNOSIS — Z23 Encounter for immunization: Secondary | ICD-10-CM | POA: Diagnosis not present

## 2019-03-07 DIAGNOSIS — H353221 Exudative age-related macular degeneration, left eye, with active choroidal neovascularization: Secondary | ICD-10-CM | POA: Diagnosis not present

## 2019-04-01 ENCOUNTER — Other Ambulatory Visit: Payer: Self-pay

## 2019-04-01 ENCOUNTER — Ambulatory Visit (INDEPENDENT_AMBULATORY_CARE_PROVIDER_SITE_OTHER): Payer: Medicare PPO | Admitting: Internal Medicine

## 2019-04-01 ENCOUNTER — Encounter: Payer: Self-pay | Admitting: Internal Medicine

## 2019-04-01 VITALS — BP 140/88 | HR 105 | Temp 98.3°F | Ht 69.0 in | Wt 297.0 lb

## 2019-04-01 DIAGNOSIS — H353210 Exudative age-related macular degeneration, right eye, stage unspecified: Secondary | ICD-10-CM

## 2019-04-01 DIAGNOSIS — Z7189 Other specified counseling: Secondary | ICD-10-CM | POA: Diagnosis not present

## 2019-04-01 DIAGNOSIS — I10 Essential (primary) hypertension: Secondary | ICD-10-CM

## 2019-04-01 DIAGNOSIS — Z0000 Encounter for general adult medical examination without abnormal findings: Secondary | ICD-10-CM | POA: Diagnosis not present

## 2019-04-01 DIAGNOSIS — M1711 Unilateral primary osteoarthritis, right knee: Secondary | ICD-10-CM | POA: Insufficient documentation

## 2019-04-01 LAB — COMPREHENSIVE METABOLIC PANEL
ALT: 15 U/L (ref 0–53)
AST: 17 U/L (ref 0–37)
Albumin: 4 g/dL (ref 3.5–5.2)
Alkaline Phosphatase: 54 U/L (ref 39–117)
BUN: 12 mg/dL (ref 6–23)
CO2: 30 mEq/L (ref 19–32)
Calcium: 9.6 mg/dL (ref 8.4–10.5)
Chloride: 101 mEq/L (ref 96–112)
Creatinine, Ser: 0.95 mg/dL (ref 0.40–1.50)
GFR: 75.82 mL/min (ref >60.00)
Glucose, Bld: 103 mg/dL — ABNORMAL HIGH (ref 70–99)
Potassium: 3.6 mEq/L (ref 3.5–5.1)
Sodium: 139 mEq/L (ref 135–145)
Total Bilirubin: 0.7 mg/dL (ref 0.2–1.2)
Total Protein: 6.7 g/dL (ref 6.0–8.3)

## 2019-04-01 LAB — CBC
HCT: 49.2 % (ref 39.0–52.0)
Hemoglobin: 16.7 g/dL (ref 13.0–17.0)
MCHC: 33.8 g/dL (ref 30.0–36.0)
MCV: 100 fl (ref 78.0–100.0)
Platelets: 212 10*3/uL (ref 150.0–400.0)
RBC: 4.92 Mil/uL (ref 4.22–5.81)
RDW: 12.8 % (ref 11.5–15.5)
WBC: 7.9 10*3/uL (ref 4.0–10.5)

## 2019-04-01 NOTE — Assessment & Plan Note (Signed)
BP Readings from Last 3 Encounters:  04/01/19 140/88  09/11/18 126/86  08/03/18 128/78   Good control Will recheck labs

## 2019-04-01 NOTE — Assessment & Plan Note (Signed)
Discussed OTC diclofenac

## 2019-04-01 NOTE — Patient Instructions (Signed)
Please try over the counter diclofenac gel on your right knee--this has the best evidence to help your knee pain.

## 2019-04-01 NOTE — Progress Notes (Signed)
Hearing Screening   125Hz  250Hz  500Hz  1000Hz  2000Hz  3000Hz  4000Hz  6000Hz  8000Hz   Right ear:           Left ear:           Comments: June 2020 ENT  Vision Screening Comments: October 2020

## 2019-04-01 NOTE — Assessment & Plan Note (Signed)
I have personally reviewed the Medicare Annual Wellness questionnaire and have noted 1. The patient's medical and social history 2. Their use of alcohol, tobacco or illicit drugs 3. Their current medications and supplements 4. The patient's functional ability including ADL's, fall risks, home safety risks and hearing or visual             impairment. 5. Diet and physical activities 6. Evidence for depression or mood disorders  The patients weight, height, BMI and visual acuity have been recorded in the chart I have made referrals, counseling and provided education to the patient based review of the above and I have provided the pt with a written personalized care plan for preventive services.  I have provided you with a copy of your personalized plan for preventive services. Please take the time to review along with your updated medication list.  Had flu vaccine No cancer screening due to age Discussed fitness

## 2019-04-01 NOTE — Progress Notes (Signed)
Subjective:    Patient ID: Brad Reed, male    DOB: [birth year], 84 y.o.   MRN: [id number]  HPI Here for Medicare wellness visit and follow up of chronic health conditions Reviewed form and advanced directives Reviewed other doctors Has 1-2 vodka drinks a day No tobacco No real exercise---does some leg lifts Did go to ENT--- got cleared out cerumen. No need for aide Fell once--no injury No sig depression or anhedonia. Trying to deal with COVID  Doing okay No changes in memory Able to drive without apparent problems Still does some housework--sister does most of it Independent with ADLs  Wears briefs at night for incontinence Buttock rash is under control  No symptoms to suggest recurrent infection No fevers Eating well Has gained back some of his weight  Having ongoing knee pain Trying an OTC pill--"Insta-flex"---no clear if it will help Trouble on stairs  Doesn't check BP No headaches No SOB or chest pain No dizziness or syncope Some edema---no worse than usual  Voids okay Stream is reasonable Stable nocturia x 1  Still getting avastin shots in left eye every 7 weeks Vision is okay  Current Outpatient Medications on File Prior to Visit  Medication Sig Dispense Refill  . acetaminophen (TYLENOL) 325 MG tablet Take 2 tablets (650 mg total) by mouth every 6 (six) hours as needed for mild pain or moderate pain (or Fever >/= 101).    . Glucosamine-Chondroitin (GLUCOSAMINE CHONDR COMPLEX PO) Take by mouth.    . multivitamin-lutein (OCUVITE-LUTEIN) CAPS capsule Take 1 capsule by mouth daily.    Marland Kitchen nystatin (MYCOSTATIN/NYSTOP) powder Apply topically 2 (two) times daily. 60 g 5  . potassium chloride (K-DUR) 10 MEQ tablet Take 1 tablet (10 mEq total) by mouth daily. 90 tablet 3  . triamterene-hydrochlorothiazide (DYAZIDE) 37.5-25 MG capsule Take 1 each (1 capsule total) by mouth daily. 90 capsule 3   No current facility-administered medications on file prior to visit.      Allergies  Allergen Reactions  . Ampicillin Hives    Past Medical History:  Diagnosis Date  . Angiodysplasia 02/13/2006  . Arthritis    knees  . Benign prostatic hypertrophy   . Colon polyps   . Diverticulosis of colon   . Hypertension   . Macular degeneration, wet ([place])    Dr Dwain Sarna  . Obesity   . Peripheral neuropathy    feet  . Sleep apnea    DX'd when younger. declined CPAP.    Past Surgical History:  Procedure Laterality Date  . APPENDECTOMY    . CATARACT EXTRACTION W/PHACO Left 03/05/2018   Procedure: CATARACT EXTRACTION PHACO AND INTRAOCULAR LENS PLACEMENT ([place]) LEFT;  Surgeon: Eulogio Bear, MD;  Location: [place];  Service: Ophthalmology;  Laterality: Left;  sleep apnea  . CATARACT EXTRACTION W/PHACO Right 03/19/2018   Procedure: CATARACT EXTRACTION PHACO AND INTRAOCULAR LENS PLACEMENT (IOC);  Surgeon: Eulogio Bear, MD;  Location: [place];  Service: Ophthalmology;  Laterality: Right;  . IR GUIDED DRAIN W CATHETER PLACEMENT  07/03/2018  . IR RADIOLOGIST EVAL & MGMT  07/18/2018  . KNEE ARTHROSCOPY     BOTH KNEES  . MASTOID DEBRIDEMENT    . PILONIDAL CYST EXCISION  1960  . TONSILLECTOMY      Family History  Problem Relation Age of Onset  . Cancer Mother   . Obesity Sister   . Diabetes Sister   . Diabetes Maternal Uncle     Social History   Socioeconomic  History  . Marital status: Widowed    Spouse name: Not on file  . Number of children: 3  . Years of education: Not on file  . Highest education level: Not on file  Occupational History  . 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 Financial resource strain: Not on file  . Food insecurity    Worry: Not on file    Inability: Not on file  . Transportation needs    Medical: Not on file    Non-medical: Not on file  Tobacco Use  . Smoking status: Former Smoker    Types: Cigarettes    Quit date:  05/17/1983    Years since quitting: 35.8  . Smokeless tobacco: Never Used  Substance and Sexual Activity  . Alcohol use: Yes    Alcohol/week: 14.0 standard drinks    Types: 14 Shots of liquor per week    Comment: Drinks 2 cocktails /day  . Drug use: No  . Sexual activity: Not on file  Lifestyle  . Physical activity    Days per week: Not on file    Minutes per session: Not on file  . Stress: Not on file  Relationships  . Social Musician on phone: Not on file    Gets together: Not on file    Attends religious service: Not on file    Active member of club or organization: Not on file    Attends meetings of clubs or organizations: Not on file    Relationship status: Not on file  . Intimate partner violence    Fear of current or ex partner: Not on file    Emotionally abused: Not on file    Physically abused: Not on file    Forced sexual activity: Not on file  Other Topics Concern  . Not on file  Social History Narrative   Has living will    Son [name] is health care POA   Would accept resuscitation but no prolonged artificial life support   No tube feeds if cognitively unaware   Review of Systems Appetite is fine Weight up again some Sleeps okay---with TV on Teeth okay---tries to avoid dentist No suspicious lesions----skin tears easily No heartburn or dysphagia Bowels are fine--no blood No other joint issues---just the right knee    Objective:   Physical Exam  Constitutional: He appears well-developed. No distress.  HENT:  Mouth/Throat: Oropharynx is clear and moist. No oropharyngeal exudate.  Neck: No thyromegaly present.  Cardiovascular: Normal rate, regular rhythm and normal heart sounds. Exam reveals no gallop.  No murmur heard. 1+ pulse in left, faint in right foot  Respiratory: Effort normal and breath sounds normal. No respiratory distress. He has no wheezes. He has no rales.  DOE getting up on the table though  GI: Soft. There is no abdominal  tenderness.  Musculoskeletal:        General: No tenderness.     Comments: 1-2+ non pitting edema in calves  Lymphadenopathy:    He has no cervical adenopathy.  Neurological:  President--- [place], [place]" [id number] Recall 3/3  Skin:  No buttock rash  Psychiatric: He has a normal mood and affect. His behavior is normal.           Assessment & Plan:

## 2019-04-01 NOTE — Assessment & Plan Note (Signed)
Discussed proper eating and fitness Recommended trying for 10# loss in next year

## 2019-04-01 NOTE — Assessment & Plan Note (Signed)
See social history 

## 2019-04-01 NOTE — Assessment & Plan Note (Signed)
Gets avastin every 7 weeks

## 2019-04-24 DIAGNOSIS — H353221 Exudative age-related macular degeneration, left eye, with active choroidal neovascularization: Secondary | ICD-10-CM | POA: Diagnosis not present

## 2019-06-03 ENCOUNTER — Encounter: Payer: Self-pay | Admitting: Podiatry

## 2019-06-03 ENCOUNTER — Other Ambulatory Visit: Payer: Self-pay

## 2019-06-03 ENCOUNTER — Ambulatory Visit: Payer: Medicare HMO | Admitting: Podiatry

## 2019-06-03 DIAGNOSIS — G629 Polyneuropathy, unspecified: Secondary | ICD-10-CM | POA: Diagnosis not present

## 2019-06-03 DIAGNOSIS — M79676 Pain in unspecified toe(s): Secondary | ICD-10-CM | POA: Diagnosis not present

## 2019-06-03 DIAGNOSIS — B351 Tinea unguium: Secondary | ICD-10-CM | POA: Diagnosis not present

## 2019-06-03 NOTE — Progress Notes (Signed)
Complaint:  Visit Type: Patient returns to my office for continued preventative foot care services. Complaint: Patient states" my nails have grown long and thick and become painful to walk and wear shoes" Patient has been diagnosed pre-diabetic with neuropathy. The patient presents for preventative foot care services. Patient has been hospitalized with a liver infection.  Podiatric Exam: Vascular: dorsalis pedis  pulses are palpable left foot.  Marland Kitchen  PT pulses are non palpable  B/L.  Excessive swelling  B/L   Capillary return is immediate. Temperature gradient is WNL. Skin turgor WNL  Sensorium: Absent  Semmes Weinstein monofilament test.  LOPS WNL right foot. Normal tactile sensation bilaterally. Nail Exam: Pt has thick disfigured discolored nails with subungual debris noted bilateral entire nail hallux through fifth toenails Ulcer Exam: There is no evidence of ulcer or pre-ulcerative changes or infection. Orthopedic Exam: Muscle tone and strength are WNL. No limitations in general ROM. No crepitus or effusions noted. Foot type and digits show no abnormalities. Bony prominences are unremarkable. Skin: No Porokeratosis. No infection or ulcers  Diagnosis:  Onychomycosis, , Pain in right toe, pain in left toes  Treatment & Plan Procedures and Treatment: Consent by patient was obtained for treatment procedures. The patient understood the discussion of treatment and procedures well. All questions were answered thoroughly reviewed. Debridement of mycotic and hypertrophic toenails, 1 through 5 bilateral and clearing of subungual debris. No ulceration, no infection noted.  Return Visit-Office Procedure: Patient instructed to return to the office for a follow up visit 3 months for continued evaluation and treatment.    Helane Gunther DPM

## 2019-06-20 DIAGNOSIS — H353221 Exudative age-related macular degeneration, left eye, with active choroidal neovascularization: Secondary | ICD-10-CM | POA: Diagnosis not present

## 2019-08-07 DIAGNOSIS — H353221 Exudative age-related macular degeneration, left eye, with active choroidal neovascularization: Secondary | ICD-10-CM | POA: Diagnosis not present

## 2019-09-02 ENCOUNTER — Ambulatory Visit: Payer: Medicare HMO | Admitting: Podiatry

## 2019-09-05 ENCOUNTER — Ambulatory Visit (INDEPENDENT_AMBULATORY_CARE_PROVIDER_SITE_OTHER): Payer: Medicare HMO | Admitting: Podiatry

## 2019-09-05 ENCOUNTER — Other Ambulatory Visit: Payer: Self-pay

## 2019-09-05 ENCOUNTER — Encounter: Payer: Self-pay | Admitting: Podiatry

## 2019-09-05 VITALS — Temp 97.0°F

## 2019-09-05 DIAGNOSIS — G629 Polyneuropathy, unspecified: Secondary | ICD-10-CM

## 2019-09-05 DIAGNOSIS — B351 Tinea unguium: Secondary | ICD-10-CM

## 2019-09-05 DIAGNOSIS — M79676 Pain in unspecified toe(s): Secondary | ICD-10-CM

## 2019-09-05 NOTE — Progress Notes (Signed)
This patient returns to my office for at risk foot care.  This patient requires this care by a professional since this patient will be at risk due to having neuropathy.  This patient is unable to cut nails himself since the patient cannot reach his nails.These nails are painful walking and wearing shoes.  This patient presents for at risk foot care today.  General Appearance  Alert, conversant and in no acute stress.  Vascular  Dorsalis pedis and posterior tibial  pulses are weakly palpable  bilaterally.  Capillary return is within normal limits  bilaterally. Temperature is within normal limits  bilaterally.  Neurologic  Senn-Weinstein monofilament wire test absent   bilaterally. Muscle power within normal limits bilaterally.  Nails Thick disfigured discolored nails with subungual debris  from hallux to fifth toes bilaterally. No evidence of bacterial infection or drainage bilaterally.  Orthopedic  No limitations of motion  feet .  No crepitus or effusions noted.  No bony pathology or digital deformities noted.  Skin  normotropic skin with no porokeratosis noted bilaterally.  No signs of infections or ulcers noted.     Onychomycosis  Pain in right toes  Pain in left toes  Consent was obtained for treatment procedures.   Mechanical debridement of nails 1-5  bilaterally performed with a nail nipper.  Filed with dremel without incident.    Return office visit  3 months                    Told patient to return for periodic foot care and evaluation due to potential at risk complications.   Belkys Henault DPM  

## 2019-10-02 DIAGNOSIS — H353221 Exudative age-related macular degeneration, left eye, with active choroidal neovascularization: Secondary | ICD-10-CM | POA: Diagnosis not present

## 2019-10-29 DIAGNOSIS — R2681 Unsteadiness on feet: Secondary | ICD-10-CM | POA: Diagnosis not present

## 2019-10-29 DIAGNOSIS — N3946 Mixed incontinence: Secondary | ICD-10-CM | POA: Diagnosis not present

## 2019-10-29 DIAGNOSIS — R296 Repeated falls: Secondary | ICD-10-CM | POA: Diagnosis not present

## 2019-10-29 DIAGNOSIS — M6281 Muscle weakness (generalized): Secondary | ICD-10-CM | POA: Diagnosis not present

## 2019-10-31 DIAGNOSIS — N3946 Mixed incontinence: Secondary | ICD-10-CM | POA: Diagnosis not present

## 2019-10-31 DIAGNOSIS — R296 Repeated falls: Secondary | ICD-10-CM | POA: Diagnosis not present

## 2019-10-31 DIAGNOSIS — M6281 Muscle weakness (generalized): Secondary | ICD-10-CM | POA: Diagnosis not present

## 2019-10-31 DIAGNOSIS — R2681 Unsteadiness on feet: Secondary | ICD-10-CM | POA: Diagnosis not present

## 2019-11-01 DIAGNOSIS — R2681 Unsteadiness on feet: Secondary | ICD-10-CM | POA: Diagnosis not present

## 2019-11-01 DIAGNOSIS — R296 Repeated falls: Secondary | ICD-10-CM | POA: Diagnosis not present

## 2019-11-01 DIAGNOSIS — M6281 Muscle weakness (generalized): Secondary | ICD-10-CM | POA: Diagnosis not present

## 2019-11-01 DIAGNOSIS — N3946 Mixed incontinence: Secondary | ICD-10-CM | POA: Diagnosis not present

## 2019-11-04 DIAGNOSIS — M6281 Muscle weakness (generalized): Secondary | ICD-10-CM | POA: Diagnosis not present

## 2019-11-04 DIAGNOSIS — R296 Repeated falls: Secondary | ICD-10-CM | POA: Diagnosis not present

## 2019-11-04 DIAGNOSIS — N3946 Mixed incontinence: Secondary | ICD-10-CM | POA: Diagnosis not present

## 2019-11-04 DIAGNOSIS — R2681 Unsteadiness on feet: Secondary | ICD-10-CM | POA: Diagnosis not present

## 2019-11-05 DIAGNOSIS — R296 Repeated falls: Secondary | ICD-10-CM | POA: Diagnosis not present

## 2019-11-05 DIAGNOSIS — N3946 Mixed incontinence: Secondary | ICD-10-CM | POA: Diagnosis not present

## 2019-11-05 DIAGNOSIS — M6281 Muscle weakness (generalized): Secondary | ICD-10-CM | POA: Diagnosis not present

## 2019-11-05 DIAGNOSIS — R2681 Unsteadiness on feet: Secondary | ICD-10-CM | POA: Diagnosis not present

## 2019-11-07 DIAGNOSIS — R2681 Unsteadiness on feet: Secondary | ICD-10-CM | POA: Diagnosis not present

## 2019-11-07 DIAGNOSIS — N3946 Mixed incontinence: Secondary | ICD-10-CM | POA: Diagnosis not present

## 2019-11-07 DIAGNOSIS — R296 Repeated falls: Secondary | ICD-10-CM | POA: Diagnosis not present

## 2019-11-07 DIAGNOSIS — M6281 Muscle weakness (generalized): Secondary | ICD-10-CM | POA: Diagnosis not present

## 2019-11-08 DIAGNOSIS — R2681 Unsteadiness on feet: Secondary | ICD-10-CM | POA: Diagnosis not present

## 2019-11-08 DIAGNOSIS — R296 Repeated falls: Secondary | ICD-10-CM | POA: Diagnosis not present

## 2019-11-08 DIAGNOSIS — N3946 Mixed incontinence: Secondary | ICD-10-CM | POA: Diagnosis not present

## 2019-11-08 DIAGNOSIS — M6281 Muscle weakness (generalized): Secondary | ICD-10-CM | POA: Diagnosis not present

## 2019-11-11 DIAGNOSIS — M6281 Muscle weakness (generalized): Secondary | ICD-10-CM | POA: Diagnosis not present

## 2019-11-11 DIAGNOSIS — R2681 Unsteadiness on feet: Secondary | ICD-10-CM | POA: Diagnosis not present

## 2019-11-11 DIAGNOSIS — R296 Repeated falls: Secondary | ICD-10-CM | POA: Diagnosis not present

## 2019-11-11 DIAGNOSIS — N3946 Mixed incontinence: Secondary | ICD-10-CM | POA: Diagnosis not present

## 2019-11-12 DIAGNOSIS — M6281 Muscle weakness (generalized): Secondary | ICD-10-CM | POA: Diagnosis not present

## 2019-11-12 DIAGNOSIS — R296 Repeated falls: Secondary | ICD-10-CM | POA: Diagnosis not present

## 2019-11-12 DIAGNOSIS — N3946 Mixed incontinence: Secondary | ICD-10-CM | POA: Diagnosis not present

## 2019-11-12 DIAGNOSIS — R2681 Unsteadiness on feet: Secondary | ICD-10-CM | POA: Diagnosis not present

## 2019-11-13 DIAGNOSIS — M6281 Muscle weakness (generalized): Secondary | ICD-10-CM | POA: Diagnosis not present

## 2019-11-13 DIAGNOSIS — N3946 Mixed incontinence: Secondary | ICD-10-CM | POA: Diagnosis not present

## 2019-11-13 DIAGNOSIS — R2681 Unsteadiness on feet: Secondary | ICD-10-CM | POA: Diagnosis not present

## 2019-11-13 DIAGNOSIS — R296 Repeated falls: Secondary | ICD-10-CM | POA: Diagnosis not present

## 2019-11-14 DIAGNOSIS — R2681 Unsteadiness on feet: Secondary | ICD-10-CM | POA: Diagnosis not present

## 2019-11-14 DIAGNOSIS — M6281 Muscle weakness (generalized): Secondary | ICD-10-CM | POA: Diagnosis not present

## 2019-11-14 DIAGNOSIS — R296 Repeated falls: Secondary | ICD-10-CM | POA: Diagnosis not present

## 2019-11-14 DIAGNOSIS — N3946 Mixed incontinence: Secondary | ICD-10-CM | POA: Diagnosis not present

## 2019-11-18 DIAGNOSIS — R2681 Unsteadiness on feet: Secondary | ICD-10-CM | POA: Diagnosis not present

## 2019-11-18 DIAGNOSIS — R296 Repeated falls: Secondary | ICD-10-CM | POA: Diagnosis not present

## 2019-11-18 DIAGNOSIS — M6281 Muscle weakness (generalized): Secondary | ICD-10-CM | POA: Diagnosis not present

## 2019-11-18 DIAGNOSIS — N3946 Mixed incontinence: Secondary | ICD-10-CM | POA: Diagnosis not present

## 2019-11-19 DIAGNOSIS — N3946 Mixed incontinence: Secondary | ICD-10-CM | POA: Diagnosis not present

## 2019-11-19 DIAGNOSIS — M6281 Muscle weakness (generalized): Secondary | ICD-10-CM | POA: Diagnosis not present

## 2019-11-19 DIAGNOSIS — R296 Repeated falls: Secondary | ICD-10-CM | POA: Diagnosis not present

## 2019-11-19 DIAGNOSIS — R2681 Unsteadiness on feet: Secondary | ICD-10-CM | POA: Diagnosis not present

## 2019-11-20 DIAGNOSIS — H353221 Exudative age-related macular degeneration, left eye, with active choroidal neovascularization: Secondary | ICD-10-CM | POA: Diagnosis not present

## 2019-11-22 DIAGNOSIS — M6281 Muscle weakness (generalized): Secondary | ICD-10-CM | POA: Diagnosis not present

## 2019-11-22 DIAGNOSIS — R296 Repeated falls: Secondary | ICD-10-CM | POA: Diagnosis not present

## 2019-11-22 DIAGNOSIS — R2681 Unsteadiness on feet: Secondary | ICD-10-CM | POA: Diagnosis not present

## 2019-11-22 DIAGNOSIS — N3946 Mixed incontinence: Secondary | ICD-10-CM | POA: Diagnosis not present

## 2019-11-25 DIAGNOSIS — R296 Repeated falls: Secondary | ICD-10-CM | POA: Diagnosis not present

## 2019-11-25 DIAGNOSIS — N3946 Mixed incontinence: Secondary | ICD-10-CM | POA: Diagnosis not present

## 2019-11-25 DIAGNOSIS — R2681 Unsteadiness on feet: Secondary | ICD-10-CM | POA: Diagnosis not present

## 2019-11-25 DIAGNOSIS — M6281 Muscle weakness (generalized): Secondary | ICD-10-CM | POA: Diagnosis not present

## 2019-11-27 DIAGNOSIS — N3946 Mixed incontinence: Secondary | ICD-10-CM | POA: Diagnosis not present

## 2019-11-27 DIAGNOSIS — R296 Repeated falls: Secondary | ICD-10-CM | POA: Diagnosis not present

## 2019-11-27 DIAGNOSIS — R2681 Unsteadiness on feet: Secondary | ICD-10-CM | POA: Diagnosis not present

## 2019-11-27 DIAGNOSIS — M6281 Muscle weakness (generalized): Secondary | ICD-10-CM | POA: Diagnosis not present

## 2019-11-29 DIAGNOSIS — M6281 Muscle weakness (generalized): Secondary | ICD-10-CM | POA: Diagnosis not present

## 2019-11-29 DIAGNOSIS — R2681 Unsteadiness on feet: Secondary | ICD-10-CM | POA: Diagnosis not present

## 2019-11-29 DIAGNOSIS — N3946 Mixed incontinence: Secondary | ICD-10-CM | POA: Diagnosis not present

## 2019-11-29 DIAGNOSIS — R296 Repeated falls: Secondary | ICD-10-CM | POA: Diagnosis not present

## 2019-12-01 DIAGNOSIS — R296 Repeated falls: Secondary | ICD-10-CM | POA: Diagnosis not present

## 2019-12-01 DIAGNOSIS — M6281 Muscle weakness (generalized): Secondary | ICD-10-CM | POA: Diagnosis not present

## 2019-12-01 DIAGNOSIS — R2681 Unsteadiness on feet: Secondary | ICD-10-CM | POA: Diagnosis not present

## 2019-12-01 DIAGNOSIS — N3946 Mixed incontinence: Secondary | ICD-10-CM | POA: Diagnosis not present

## 2019-12-02 DIAGNOSIS — R296 Repeated falls: Secondary | ICD-10-CM | POA: Diagnosis not present

## 2019-12-02 DIAGNOSIS — M6281 Muscle weakness (generalized): Secondary | ICD-10-CM | POA: Diagnosis not present

## 2019-12-02 DIAGNOSIS — N3946 Mixed incontinence: Secondary | ICD-10-CM | POA: Diagnosis not present

## 2019-12-02 DIAGNOSIS — R2681 Unsteadiness on feet: Secondary | ICD-10-CM | POA: Diagnosis not present

## 2019-12-03 DIAGNOSIS — R296 Repeated falls: Secondary | ICD-10-CM | POA: Diagnosis not present

## 2019-12-03 DIAGNOSIS — N3946 Mixed incontinence: Secondary | ICD-10-CM | POA: Diagnosis not present

## 2019-12-03 DIAGNOSIS — M6281 Muscle weakness (generalized): Secondary | ICD-10-CM | POA: Diagnosis not present

## 2019-12-03 DIAGNOSIS — R2681 Unsteadiness on feet: Secondary | ICD-10-CM | POA: Diagnosis not present

## 2019-12-04 DIAGNOSIS — N3946 Mixed incontinence: Secondary | ICD-10-CM | POA: Diagnosis not present

## 2019-12-04 DIAGNOSIS — R2681 Unsteadiness on feet: Secondary | ICD-10-CM | POA: Diagnosis not present

## 2019-12-04 DIAGNOSIS — R296 Repeated falls: Secondary | ICD-10-CM | POA: Diagnosis not present

## 2019-12-04 DIAGNOSIS — M6281 Muscle weakness (generalized): Secondary | ICD-10-CM | POA: Diagnosis not present

## 2019-12-05 DIAGNOSIS — M6281 Muscle weakness (generalized): Secondary | ICD-10-CM | POA: Diagnosis not present

## 2019-12-05 DIAGNOSIS — R2681 Unsteadiness on feet: Secondary | ICD-10-CM | POA: Diagnosis not present

## 2019-12-05 DIAGNOSIS — N3946 Mixed incontinence: Secondary | ICD-10-CM | POA: Diagnosis not present

## 2019-12-05 DIAGNOSIS — R296 Repeated falls: Secondary | ICD-10-CM | POA: Diagnosis not present

## 2019-12-06 DIAGNOSIS — M6281 Muscle weakness (generalized): Secondary | ICD-10-CM | POA: Diagnosis not present

## 2019-12-06 DIAGNOSIS — R296 Repeated falls: Secondary | ICD-10-CM | POA: Diagnosis not present

## 2019-12-06 DIAGNOSIS — R2681 Unsteadiness on feet: Secondary | ICD-10-CM | POA: Diagnosis not present

## 2019-12-06 DIAGNOSIS — N3946 Mixed incontinence: Secondary | ICD-10-CM | POA: Diagnosis not present

## 2019-12-08 DIAGNOSIS — M6281 Muscle weakness (generalized): Secondary | ICD-10-CM | POA: Diagnosis not present

## 2019-12-08 DIAGNOSIS — N3946 Mixed incontinence: Secondary | ICD-10-CM | POA: Diagnosis not present

## 2019-12-08 DIAGNOSIS — R296 Repeated falls: Secondary | ICD-10-CM | POA: Diagnosis not present

## 2019-12-08 DIAGNOSIS — R2681 Unsteadiness on feet: Secondary | ICD-10-CM | POA: Diagnosis not present

## 2019-12-09 ENCOUNTER — Ambulatory Visit: Payer: Medicare HMO | Admitting: Podiatry

## 2019-12-09 DIAGNOSIS — R2681 Unsteadiness on feet: Secondary | ICD-10-CM | POA: Diagnosis not present

## 2019-12-09 DIAGNOSIS — N3946 Mixed incontinence: Secondary | ICD-10-CM | POA: Diagnosis not present

## 2019-12-09 DIAGNOSIS — R296 Repeated falls: Secondary | ICD-10-CM | POA: Diagnosis not present

## 2019-12-09 DIAGNOSIS — M6281 Muscle weakness (generalized): Secondary | ICD-10-CM | POA: Diagnosis not present

## 2019-12-10 DIAGNOSIS — N3946 Mixed incontinence: Secondary | ICD-10-CM | POA: Diagnosis not present

## 2019-12-10 DIAGNOSIS — R296 Repeated falls: Secondary | ICD-10-CM | POA: Diagnosis not present

## 2019-12-10 DIAGNOSIS — M6281 Muscle weakness (generalized): Secondary | ICD-10-CM | POA: Diagnosis not present

## 2019-12-10 DIAGNOSIS — R2681 Unsteadiness on feet: Secondary | ICD-10-CM | POA: Diagnosis not present

## 2019-12-11 DIAGNOSIS — R2681 Unsteadiness on feet: Secondary | ICD-10-CM | POA: Diagnosis not present

## 2019-12-11 DIAGNOSIS — N3946 Mixed incontinence: Secondary | ICD-10-CM | POA: Diagnosis not present

## 2019-12-11 DIAGNOSIS — M6281 Muscle weakness (generalized): Secondary | ICD-10-CM | POA: Diagnosis not present

## 2019-12-11 DIAGNOSIS — R296 Repeated falls: Secondary | ICD-10-CM | POA: Diagnosis not present

## 2019-12-13 DIAGNOSIS — M6281 Muscle weakness (generalized): Secondary | ICD-10-CM | POA: Diagnosis not present

## 2019-12-13 DIAGNOSIS — R2681 Unsteadiness on feet: Secondary | ICD-10-CM | POA: Diagnosis not present

## 2019-12-16 DIAGNOSIS — R2681 Unsteadiness on feet: Secondary | ICD-10-CM | POA: Diagnosis not present

## 2019-12-16 DIAGNOSIS — R296 Repeated falls: Secondary | ICD-10-CM | POA: Diagnosis not present

## 2019-12-16 DIAGNOSIS — N3946 Mixed incontinence: Secondary | ICD-10-CM | POA: Diagnosis not present

## 2019-12-16 DIAGNOSIS — M6281 Muscle weakness (generalized): Secondary | ICD-10-CM | POA: Diagnosis not present

## 2019-12-17 DIAGNOSIS — M6281 Muscle weakness (generalized): Secondary | ICD-10-CM | POA: Diagnosis not present

## 2019-12-17 DIAGNOSIS — R2681 Unsteadiness on feet: Secondary | ICD-10-CM | POA: Diagnosis not present

## 2019-12-17 DIAGNOSIS — N3946 Mixed incontinence: Secondary | ICD-10-CM | POA: Diagnosis not present

## 2019-12-17 DIAGNOSIS — R296 Repeated falls: Secondary | ICD-10-CM | POA: Diagnosis not present

## 2019-12-18 DIAGNOSIS — R2681 Unsteadiness on feet: Secondary | ICD-10-CM | POA: Diagnosis not present

## 2019-12-18 DIAGNOSIS — M6281 Muscle weakness (generalized): Secondary | ICD-10-CM | POA: Diagnosis not present

## 2019-12-18 DIAGNOSIS — N3946 Mixed incontinence: Secondary | ICD-10-CM | POA: Diagnosis not present

## 2019-12-18 DIAGNOSIS — R296 Repeated falls: Secondary | ICD-10-CM | POA: Diagnosis not present

## 2019-12-19 ENCOUNTER — Other Ambulatory Visit: Payer: Self-pay

## 2019-12-19 ENCOUNTER — Ambulatory Visit: Payer: Medicare HMO | Admitting: Podiatry

## 2019-12-19 ENCOUNTER — Encounter: Payer: Self-pay | Admitting: Podiatry

## 2019-12-19 DIAGNOSIS — B351 Tinea unguium: Secondary | ICD-10-CM | POA: Diagnosis not present

## 2019-12-19 DIAGNOSIS — G629 Polyneuropathy, unspecified: Secondary | ICD-10-CM

## 2019-12-19 DIAGNOSIS — M79676 Pain in unspecified toe(s): Secondary | ICD-10-CM | POA: Diagnosis not present

## 2019-12-19 NOTE — Progress Notes (Signed)
This patient returns to my office for at risk foot care.  This patient requires this care by a professional since this patient will be at risk due to having neuropathy.  This patient is unable to cut nails himself since the patient cannot reach his nails.These nails are painful walking and wearing shoes.  This patient presents for at risk foot care today.  General Appearance  Alert, conversant and in no acute stress.  Vascular  Dorsalis pedis and posterior tibial  pulses are weakly palpable  bilaterally.  Capillary return is within normal limits  bilaterally. Temperature is within normal limits  bilaterally.  Neurologic  Senn-Weinstein monofilament wire test absent   bilaterally. Muscle power within normal limits bilaterally.  Nails Thick disfigured discolored nails with subungual debris  from hallux to fifth toes bilaterally. No evidence of bacterial infection or drainage bilaterally.  Orthopedic  No limitations of motion  feet .  No crepitus or effusions noted.  No bony pathology or digital deformities noted.  Skin  normotropic skin with no porokeratosis noted bilaterally.  No signs of infections or ulcers noted.     Onychomycosis  Pain in right toes  Pain in left toes  Consent was obtained for treatment procedures.   Mechanical debridement of nails 1-5  bilaterally performed with a nail nipper.  Filed with dremel without incident.    Return office visit  3 months                    Told patient to return for periodic foot care and evaluation due to potential at risk complications.   Bernard Donahoo DPM  

## 2019-12-20 DIAGNOSIS — R2681 Unsteadiness on feet: Secondary | ICD-10-CM | POA: Diagnosis not present

## 2019-12-20 DIAGNOSIS — R296 Repeated falls: Secondary | ICD-10-CM | POA: Diagnosis not present

## 2019-12-20 DIAGNOSIS — N3946 Mixed incontinence: Secondary | ICD-10-CM | POA: Diagnosis not present

## 2019-12-20 DIAGNOSIS — M6281 Muscle weakness (generalized): Secondary | ICD-10-CM | POA: Diagnosis not present

## 2019-12-24 DIAGNOSIS — M6281 Muscle weakness (generalized): Secondary | ICD-10-CM | POA: Diagnosis not present

## 2019-12-24 DIAGNOSIS — R296 Repeated falls: Secondary | ICD-10-CM | POA: Diagnosis not present

## 2019-12-24 DIAGNOSIS — R2681 Unsteadiness on feet: Secondary | ICD-10-CM | POA: Diagnosis not present

## 2019-12-24 DIAGNOSIS — N3946 Mixed incontinence: Secondary | ICD-10-CM | POA: Diagnosis not present

## 2019-12-25 DIAGNOSIS — M6281 Muscle weakness (generalized): Secondary | ICD-10-CM | POA: Diagnosis not present

## 2019-12-25 DIAGNOSIS — R296 Repeated falls: Secondary | ICD-10-CM | POA: Diagnosis not present

## 2019-12-25 DIAGNOSIS — N3946 Mixed incontinence: Secondary | ICD-10-CM | POA: Diagnosis not present

## 2019-12-25 DIAGNOSIS — R2681 Unsteadiness on feet: Secondary | ICD-10-CM | POA: Diagnosis not present

## 2019-12-26 DIAGNOSIS — N3946 Mixed incontinence: Secondary | ICD-10-CM | POA: Diagnosis not present

## 2019-12-26 DIAGNOSIS — R2681 Unsteadiness on feet: Secondary | ICD-10-CM | POA: Diagnosis not present

## 2019-12-26 DIAGNOSIS — R296 Repeated falls: Secondary | ICD-10-CM | POA: Diagnosis not present

## 2019-12-26 DIAGNOSIS — M6281 Muscle weakness (generalized): Secondary | ICD-10-CM | POA: Diagnosis not present

## 2019-12-31 DIAGNOSIS — R296 Repeated falls: Secondary | ICD-10-CM | POA: Diagnosis not present

## 2019-12-31 DIAGNOSIS — N3946 Mixed incontinence: Secondary | ICD-10-CM | POA: Diagnosis not present

## 2019-12-31 DIAGNOSIS — M6281 Muscle weakness (generalized): Secondary | ICD-10-CM | POA: Diagnosis not present

## 2019-12-31 DIAGNOSIS — R2681 Unsteadiness on feet: Secondary | ICD-10-CM | POA: Diagnosis not present

## 2020-01-01 DIAGNOSIS — R296 Repeated falls: Secondary | ICD-10-CM | POA: Diagnosis not present

## 2020-01-01 DIAGNOSIS — M6281 Muscle weakness (generalized): Secondary | ICD-10-CM | POA: Diagnosis not present

## 2020-01-01 DIAGNOSIS — R2681 Unsteadiness on feet: Secondary | ICD-10-CM | POA: Diagnosis not present

## 2020-01-01 DIAGNOSIS — N3946 Mixed incontinence: Secondary | ICD-10-CM | POA: Diagnosis not present

## 2020-01-03 DIAGNOSIS — M6281 Muscle weakness (generalized): Secondary | ICD-10-CM | POA: Diagnosis not present

## 2020-01-03 DIAGNOSIS — R2681 Unsteadiness on feet: Secondary | ICD-10-CM | POA: Diagnosis not present

## 2020-01-03 DIAGNOSIS — N3946 Mixed incontinence: Secondary | ICD-10-CM | POA: Diagnosis not present

## 2020-01-03 DIAGNOSIS — R296 Repeated falls: Secondary | ICD-10-CM | POA: Diagnosis not present

## 2020-01-08 DIAGNOSIS — R296 Repeated falls: Secondary | ICD-10-CM | POA: Diagnosis not present

## 2020-01-08 DIAGNOSIS — R2681 Unsteadiness on feet: Secondary | ICD-10-CM | POA: Diagnosis not present

## 2020-01-08 DIAGNOSIS — N3946 Mixed incontinence: Secondary | ICD-10-CM | POA: Diagnosis not present

## 2020-01-08 DIAGNOSIS — M6281 Muscle weakness (generalized): Secondary | ICD-10-CM | POA: Diagnosis not present

## 2020-02-05 DIAGNOSIS — H353221 Exudative age-related macular degeneration, left eye, with active choroidal neovascularization: Secondary | ICD-10-CM | POA: Diagnosis not present

## 2020-03-26 ENCOUNTER — Encounter: Payer: Self-pay | Admitting: Podiatry

## 2020-03-26 ENCOUNTER — Ambulatory Visit: Payer: Medicare HMO | Admitting: Podiatry

## 2020-03-26 ENCOUNTER — Other Ambulatory Visit: Payer: Self-pay

## 2020-03-26 DIAGNOSIS — M79676 Pain in unspecified toe(s): Secondary | ICD-10-CM

## 2020-03-26 DIAGNOSIS — G629 Polyneuropathy, unspecified: Secondary | ICD-10-CM | POA: Diagnosis not present

## 2020-03-26 DIAGNOSIS — B351 Tinea unguium: Secondary | ICD-10-CM

## 2020-03-26 NOTE — Progress Notes (Signed)
This patient returns to my office for at risk foot care.  This patient requires this care by a professional since this patient will be at risk due to having neuropathy.  This patient is unable to cut nails himself since the patient cannot reach his nails.These nails are painful walking and wearing shoes.  This patient presents for at risk foot care today.  General Appearance  Alert, conversant and in no acute stress.  Vascular  Dorsalis pedis and posterior tibial  pulses are weakly palpable  bilaterally.  Capillary return is within normal limits  bilaterally. Temperature is within normal limits  bilaterally.  Neurologic  Senn-Weinstein monofilament wire test absent   bilaterally. Muscle power within normal limits bilaterally.  Nails Thick disfigured discolored nails with subungual debris  from hallux to fifth toes bilaterally. No evidence of bacterial infection or drainage bilaterally.  Orthopedic  No limitations of motion  feet .  No crepitus or effusions noted.  No bony pathology or digital deformities noted.  Skin  normotropic skin with no porokeratosis noted bilaterally.  No signs of infections or ulcers noted.     Onychomycosis  Pain in right toes  Pain in left toes  Consent was obtained for treatment procedures.   Mechanical debridement of nails 1-5  bilaterally performed with a nail nipper.  Filed with dremel without incident.    Return office visit  3 months                    Told patient to return for periodic foot care and evaluation due to potential at risk complications.   Chella Chapdelaine DPM  

## 2020-04-06 ENCOUNTER — Encounter: Payer: Medicare PPO | Admitting: Internal Medicine

## 2020-04-06 ENCOUNTER — Encounter: Payer: Self-pay | Admitting: Internal Medicine

## 2020-04-06 ENCOUNTER — Other Ambulatory Visit: Payer: Self-pay

## 2020-04-06 ENCOUNTER — Ambulatory Visit (INDEPENDENT_AMBULATORY_CARE_PROVIDER_SITE_OTHER): Payer: Medicare HMO | Admitting: Internal Medicine

## 2020-04-06 VITALS — BP 128/74 | HR 111 | Temp 97.2°F | Ht 69.0 in | Wt 274.0 lb

## 2020-04-06 DIAGNOSIS — N138 Other obstructive and reflux uropathy: Secondary | ICD-10-CM

## 2020-04-06 DIAGNOSIS — F39 Unspecified mood [affective] disorder: Secondary | ICD-10-CM | POA: Diagnosis not present

## 2020-04-06 DIAGNOSIS — Z23 Encounter for immunization: Secondary | ICD-10-CM | POA: Diagnosis not present

## 2020-04-06 DIAGNOSIS — Z0000 Encounter for general adult medical examination without abnormal findings: Secondary | ICD-10-CM | POA: Diagnosis not present

## 2020-04-06 DIAGNOSIS — H353210 Exudative age-related macular degeneration, right eye, stage unspecified: Secondary | ICD-10-CM | POA: Diagnosis not present

## 2020-04-06 DIAGNOSIS — I10 Essential (primary) hypertension: Secondary | ICD-10-CM

## 2020-04-06 DIAGNOSIS — N401 Enlarged prostate with lower urinary tract symptoms: Secondary | ICD-10-CM | POA: Diagnosis not present

## 2020-04-06 DIAGNOSIS — Z7189 Other specified counseling: Secondary | ICD-10-CM | POA: Diagnosis not present

## 2020-04-06 NOTE — Addendum Note (Signed)
Addended by: Eual Fines on: 04/06/2020 04:20 PM   Modules accepted: Orders

## 2020-04-06 NOTE — Assessment & Plan Note (Signed)
Continues on injections in left eye 

## 2020-04-06 NOTE — Assessment & Plan Note (Signed)
I have personally reviewed the Medicare Annual Wellness questionnaire and have noted 1. The patient's medical and social history 2. Their use of alcohol, tobacco or illicit drugs 3. Their current medications and supplements 4. The patient's functional ability including ADL's, fall risks, home safety risks and hearing or visual             impairment. 5. Diet and physical activities 6. Evidence for depression or mood disorders  The patients weight, height, BMI and visual acuity have been recorded in the chart I have made referrals, counseling and provided education to the patient based review of the above and I have provided the pt with a written personalized care plan for preventive services.  I have provided you with a copy of your personalized plan for preventive services. Please take the time to review along with your updated medication list.  No cancer screening due to age Discussed exercise---especially working on leg strength Flu vaccine today Td if any injury Had COVID vaccine

## 2020-04-06 NOTE — Assessment & Plan Note (Signed)
Some dysthymia----not really anhedonic Complains about [facility] but has friends there Discussed meds---but he seems stable and I am afraid of fall risk with meds

## 2020-04-06 NOTE — Assessment & Plan Note (Signed)
Doing okay without meds at this point

## 2020-04-06 NOTE — Progress Notes (Signed)
Hearing Screening   Method: Audiometry   125Hz  250Hz  500Hz  1000Hz  2000Hz  3000Hz  4000Hz  6000Hz  8000Hz   Right ear:   20 25 20  25     Left ear:   20 0 20  0    Vision Screening Comments: November 2021

## 2020-04-06 NOTE — Assessment & Plan Note (Signed)
See social history 

## 2020-04-06 NOTE — Assessment & Plan Note (Signed)
Has lost 20# with apparently healthier eating with the prepared foods Discussed exercise

## 2020-04-06 NOTE — Assessment & Plan Note (Signed)
BP Readings from Last 3 Encounters:  04/06/20 128/74  04/01/19 140/88  09/11/18 126/86   Good control on HCTZ Will check labs

## 2020-04-06 NOTE — Progress Notes (Signed)
Subjective:    Patient ID: KYL GIVLER, male    DOB: 17-May-1934, 85 y.o.   MRN: [id number]  HPI Here for Medicare wellness visit and follow up of chronic health conditions This visit occurred during the SARS-CoV-2 public health emergency.  Safety protocols were in place, including screening questions prior to the visit, additional usage of staff PPE, and extensive cleaning of exam room while observing appropriate contact time as indicated for disinfecting solutions.   Reviewed form and advanced directives Reviewed other doctors Still enjoys 1 vodka daily No tobacco Tries to walk around the building every day. 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 No injury Ongoing depression Vision is fair---poor left, okay right Hearing is poor----didn't look into hearing aides Does ADLs and some housework in his apartment Memory is about the same---he doesn't see much trouble  Living at [facility] "I hate it" Food is terrible he thinks---doesn't eat breakfast Has lost 20# since moving to the senior living environment Lives with his sister---who does like it there He is driving--bought a car  Has depression every day Some anhedonia  Has considered suicide---use a plastic bag over his head. Not considering it now Gets together with friends at [facility] daily---dinner and cards  No chest pain No SOB No dizziness or syncope Stable edema No palpitations  Still gets injections in left eye every 7 weeks  No longer taking omeprazole No heartburn or dysphagia  Current Outpatient Medications on File Prior to Visit  Medication Sig Dispense Refill  . acetaminophen (TYLENOL) 325 MG tablet Take 2 tablets (650 mg total) by mouth every 6 (six) hours as needed for mild pain or moderate pain (or Fever >/= 101).    . Aspirin Buf,CaCarb-MgCarb-MgO, 81 MG TABS Take by mouth.    . Glucosamine-Chondroitin (GLUCOSAMINE CHONDR COMPLEX PO) Take by  mouth.    . Multiple Vitamin (MULTI-VITAMIN) tablet Take by mouth.    . multivitamin-lutein (OCUVITE-LUTEIN) CAPS capsule Take 1 capsule by mouth daily.    Marland Kitchen nystatin (MYCOSTATIN/NYSTOP) powder Apply topically 2 (two) times daily. 60 g 5  . potassium chloride (K-DUR) 10 MEQ tablet Take 1 tablet (10 mEq total) by mouth daily. 90 tablet 3  . triamterene-hydrochlorothiazide (DYAZIDE) 37.5-25 MG capsule Take 1 each (1 capsule total) by mouth daily. 90 capsule 3   No current facility-administered medications on file prior to visit.    Allergies  Allergen Reactions  . Ampicillin Hives    Past Medical History:  Diagnosis Date  . Angiodysplasia 02/13/2006  . Arthritis    knees  . Benign prostatic hypertrophy   . Colon polyps   . Diverticulosis of colon   . Hypertension   . Macular degeneration, wet (HCC)    Dr Chyrl Civatte  . Obesity   . Peripheral neuropathy    feet  . Sleep apnea    DX'd when younger. declined CPAP.    Past Surgical History:  Procedure Laterality Date  . APPENDECTOMY    . CATARACT EXTRACTION W/PHACO Left 03/05/2018   Procedure: CATARACT EXTRACTION PHACO AND INTRAOCULAR LENS PLACEMENT (IOC) LEFT;  Surgeon: Nevada Crane, MD;  Location: [facility];  Service: Ophthalmology;  Laterality: Left;  sleep apnea  . CATARACT EXTRACTION W/PHACO Right 03/19/2018   Procedure: CATARACT EXTRACTION PHACO AND INTRAOCULAR LENS PLACEMENT (IOC);  Surgeon: Nevada Crane, MD;  Location: [facility];  Service: Ophthalmology;  Laterality: Right;  . IR GUIDED DRAIN W CATHETER PLACEMENT  07/03/2018  . IR RADIOLOGIST EVAL & MGMT  07/18/2018  . KNEE ARTHROSCOPY     BOTH KNEES  . MASTOID DEBRIDEMENT    . PILONIDAL CYST EXCISION  1960  . TONSILLECTOMY      Family History  Problem Relation Age of Onset  . Cancer Mother   . Obesity Sister   . Diabetes Sister   . Diabetes Maternal Uncle     Social History   Socioeconomic History  . Marital status:  Widowed    Spouse name: Not on file  . Number of children: 3  . Years of education: Not on file  . Highest education level: Not on file  Occupational History  . 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 Smoking status: Former Smoker    Types: Cigarettes    Quit date: 05/17/1983    Years since quitting: 36.9  . Smokeless tobacco: Never Used  Vaping Use  . Vaping Use: Never used  Substance and Sexual Activity  . Alcohol use: Yes    Alcohol/week: 14.0 standard drinks    Types: 14 Shots of liquor per week    Comment: Drinks 2 cocktails /day  . Drug use: No  . Sexual activity: Not on file  Other Topics Concern  . Not on file  Social History Narrative   Has living will    Son [name] is health care POA   Would accept resuscitation but no prolonged artificial life support   No tube feeds if cognitively unaware   Social Determinants of Health   Financial Resource Strain:   . Difficulty of Paying Living Expenses: Not on file  Food Insecurity:   . Worried About Programme researcher, broadcasting/film/video in the Last Year: Not on file  . Ran Out of Food in the Last Year: Not on file  Transportation Needs:   . Lack of Transportation (Medical): Not on file  . Lack of Transportation (Non-Medical): Not on file  Physical Activity:   . Days of Exercise per Week: Not on file  . Minutes of Exercise per Session: Not on file  Stress:   . Feeling of Stress : Not on file  Social Connections:   . Frequency of Communication with Friends and Family: Not on file  . Frequency of Social Gatherings with Friends and Family: Not on file  . Attends Religious Services: Not on file  . Active Member of Clubs or Organizations: Not on file  . Attends Banker Meetings: Not on file  . Marital Status: Not on file  Intimate Partner Violence:   . Fear of Current or Ex-Partner: Not on file  . Emotionally Abused: Not on file  . Physically Abused: Not on file  .  Sexually Abused: Not on file   Review of Systems Appetite is good but doesn't like the food Weight is down Trouble getting out of his chair--has lift chair. Does okay when chair has arms--he can push up. Legs are weak Wears seat belt Teeth okay--overdue for dentist No dermatologist--- no skin issues now Bowels are moving okay ---no blood Gets knee pain--no meds Voids okay---slow stream but adequate (has to sit). Nocturia x 2    Objective:   Physical Exam Constitutional:      Appearance: He is obese.  HENT:     Mouth/Throat:     Comments: No lesions Eyes:     Conjunctiva/sclera: Conjunctivae normal.     Pupils: Pupils are equal,  round, and reactive to light.  Cardiovascular:     Rate and Rhythm: Normal rate and regular rhythm.     Pulses: Normal pulses.     Heart sounds: No murmur heard.  No gallop.   Pulmonary:     Effort: Pulmonary effort is normal.     Breath sounds: Normal breath sounds. No wheezing or rales.     Comments: Mild SOB getting up on table (took considerable effort) Abdominal:     Palpations: Abdomen is soft.     Tenderness: There is no abdominal tenderness.  Musculoskeletal:     Cervical back: Neck supple.     Comments: Thick legs/feet without pitting  Lymphadenopathy:     Cervical: No cervical adenopathy.  Skin:    General: Skin is warm.     Findings: No rash.  Neurological:     Mental Status: He is alert and oriented to person, place, and time.     Comments: President--- "Verdie Drown, Garnet Koyanagi, ?" [phone] [id number] Recall 3/3  Psychiatric:        Behavior: Behavior normal.     Comments: No overt depression            Assessment & Plan:

## 2020-04-07 LAB — CBC
HCT: 48.8 % (ref 39.0–52.0)
Hemoglobin: 16.4 g/dL (ref 13.0–17.0)
MCHC: 33.7 g/dL (ref 30.0–36.0)
MCV: 97.1 fl (ref 78.0–100.0)
Platelets: 316 10*3/uL (ref 150.0–400.0)
RBC: 5.02 Mil/uL (ref 4.22–5.81)
RDW: 13 % (ref 11.5–15.5)
WBC: 7.5 10*3/uL (ref 4.0–10.5)

## 2020-04-07 LAB — COMPREHENSIVE METABOLIC PANEL
ALT: 13 U/L (ref 0–53)
AST: 18 U/L (ref 0–37)
Albumin: 3.7 g/dL (ref 3.5–5.2)
Alkaline Phosphatase: 58 U/L (ref 39–117)
BUN: 11 mg/dL (ref 6–23)
CO2: 30 mEq/L (ref 19–32)
Calcium: 9.6 mg/dL (ref 8.4–10.5)
Chloride: 101 mEq/L (ref 96–112)
Creatinine, Ser: 1.04 mg/dL (ref 0.40–1.50)
GFR: 66.42 mL/min (ref >60.00)
Glucose, Bld: 135 mg/dL — ABNORMAL HIGH (ref 70–99)
Potassium: 3.8 mEq/L (ref 3.5–5.1)
Sodium: 140 mEq/L (ref 135–145)
Total Bilirubin: 1.2 mg/dL (ref 0.2–1.2)
Total Protein: 6.3 g/dL (ref 6.0–8.3)

## 2020-04-07 LAB — T4, FREE: Free T4: 0.81 ng/dL (ref 0.60–1.60)

## 2020-04-08 ENCOUNTER — Telehealth: Payer: Self-pay | Admitting: Internal Medicine

## 2020-04-08 ENCOUNTER — Telehealth: Payer: Self-pay

## 2020-04-08 DIAGNOSIS — M6281 Muscle weakness (generalized): Secondary | ICD-10-CM | POA: Diagnosis not present

## 2020-04-08 DIAGNOSIS — R2681 Unsteadiness on feet: Secondary | ICD-10-CM | POA: Diagnosis not present

## 2020-04-08 DIAGNOSIS — R296 Repeated falls: Secondary | ICD-10-CM | POA: Diagnosis not present

## 2020-04-08 NOTE — Telephone Encounter (Signed)
Error

## 2020-04-08 NOTE — Telephone Encounter (Signed)
They are requesting verbal orders on the patient. Please call Katie [employer]) back at [phone]

## 2020-04-08 NOTE — Telephone Encounter (Signed)
I called to find out exactly what they were needing. I spoke to [place]. They needed verbal orders for PT as he has fallen several times. I gave verbal orders. Will forward to Dr Alphonsus Sias to let him know.

## 2020-04-08 NOTE — Telephone Encounter (Signed)
I am unsure. She states that she has been faxing orders to [place]. They are trying to get him into rehab I believe for his falls. Other than that I have no idea. EM

## 2020-04-08 NOTE — Telephone Encounter (Signed)
What kind of verbal orders were they asking for?

## 2020-04-08 NOTE — Telephone Encounter (Signed)
Left message to call office per Dr Alphonsus Sias: Please call him to see if he had eaten in the few hours before the test---if not, set him up for a repeat fasting lab in the next few weeks

## 2020-04-10 NOTE — Telephone Encounter (Signed)
Well---he mentioned that he thought they helped him in the past--but I had no idea they wanted to work with him again. Okay to proceed though---he is high risk

## 2020-04-15 DIAGNOSIS — R296 Repeated falls: Secondary | ICD-10-CM | POA: Diagnosis not present

## 2020-04-15 DIAGNOSIS — M6281 Muscle weakness (generalized): Secondary | ICD-10-CM | POA: Diagnosis not present

## 2020-04-15 DIAGNOSIS — H353221 Exudative age-related macular degeneration, left eye, with active choroidal neovascularization: Secondary | ICD-10-CM | POA: Diagnosis not present

## 2020-04-15 DIAGNOSIS — R2681 Unsteadiness on feet: Secondary | ICD-10-CM | POA: Diagnosis not present

## 2020-04-15 NOTE — Telephone Encounter (Signed)
Left message to call office, again. 

## 2020-04-16 DIAGNOSIS — M6281 Muscle weakness (generalized): Secondary | ICD-10-CM | POA: Diagnosis not present

## 2020-04-16 DIAGNOSIS — R2681 Unsteadiness on feet: Secondary | ICD-10-CM | POA: Diagnosis not present

## 2020-04-16 DIAGNOSIS — R296 Repeated falls: Secondary | ICD-10-CM | POA: Diagnosis not present

## 2020-04-17 DIAGNOSIS — R296 Repeated falls: Secondary | ICD-10-CM | POA: Diagnosis not present

## 2020-04-17 DIAGNOSIS — R2681 Unsteadiness on feet: Secondary | ICD-10-CM | POA: Diagnosis not present

## 2020-04-17 DIAGNOSIS — M6281 Muscle weakness (generalized): Secondary | ICD-10-CM | POA: Diagnosis not present

## 2020-04-22 DIAGNOSIS — M6281 Muscle weakness (generalized): Secondary | ICD-10-CM | POA: Diagnosis not present

## 2020-04-22 DIAGNOSIS — R296 Repeated falls: Secondary | ICD-10-CM | POA: Diagnosis not present

## 2020-04-22 DIAGNOSIS — R2681 Unsteadiness on feet: Secondary | ICD-10-CM | POA: Diagnosis not present

## 2020-04-23 NOTE — Telephone Encounter (Signed)
Left message to call office, again. 

## 2020-04-23 NOTE — Telephone Encounter (Signed)
Pt has not returned my 3 calls. I have mailed the information to him.

## 2020-04-24 DIAGNOSIS — R296 Repeated falls: Secondary | ICD-10-CM | POA: Diagnosis not present

## 2020-04-24 DIAGNOSIS — M6281 Muscle weakness (generalized): Secondary | ICD-10-CM | POA: Diagnosis not present

## 2020-04-24 DIAGNOSIS — R2681 Unsteadiness on feet: Secondary | ICD-10-CM | POA: Diagnosis not present

## 2020-04-27 DIAGNOSIS — M6281 Muscle weakness (generalized): Secondary | ICD-10-CM | POA: Diagnosis not present

## 2020-04-27 DIAGNOSIS — R296 Repeated falls: Secondary | ICD-10-CM | POA: Diagnosis not present

## 2020-04-27 DIAGNOSIS — R2681 Unsteadiness on feet: Secondary | ICD-10-CM | POA: Diagnosis not present

## 2020-04-29 DIAGNOSIS — R2681 Unsteadiness on feet: Secondary | ICD-10-CM | POA: Diagnosis not present

## 2020-04-29 DIAGNOSIS — M6281 Muscle weakness (generalized): Secondary | ICD-10-CM | POA: Diagnosis not present

## 2020-04-29 DIAGNOSIS — R296 Repeated falls: Secondary | ICD-10-CM | POA: Diagnosis not present

## 2020-05-01 DIAGNOSIS — R296 Repeated falls: Secondary | ICD-10-CM | POA: Diagnosis not present

## 2020-05-01 DIAGNOSIS — R2681 Unsteadiness on feet: Secondary | ICD-10-CM | POA: Diagnosis not present

## 2020-05-01 DIAGNOSIS — M6281 Muscle weakness (generalized): Secondary | ICD-10-CM | POA: Diagnosis not present

## 2020-05-04 DIAGNOSIS — M6281 Muscle weakness (generalized): Secondary | ICD-10-CM | POA: Diagnosis not present

## 2020-05-04 DIAGNOSIS — R2681 Unsteadiness on feet: Secondary | ICD-10-CM | POA: Diagnosis not present

## 2020-05-04 DIAGNOSIS — R296 Repeated falls: Secondary | ICD-10-CM | POA: Diagnosis not present

## 2020-05-05 DIAGNOSIS — M6281 Muscle weakness (generalized): Secondary | ICD-10-CM | POA: Diagnosis not present

## 2020-05-05 DIAGNOSIS — R2681 Unsteadiness on feet: Secondary | ICD-10-CM | POA: Diagnosis not present

## 2020-05-05 DIAGNOSIS — R296 Repeated falls: Secondary | ICD-10-CM | POA: Diagnosis not present

## 2020-05-06 DIAGNOSIS — R296 Repeated falls: Secondary | ICD-10-CM | POA: Diagnosis not present

## 2020-05-06 DIAGNOSIS — M6281 Muscle weakness (generalized): Secondary | ICD-10-CM | POA: Diagnosis not present

## 2020-05-06 DIAGNOSIS — R2681 Unsteadiness on feet: Secondary | ICD-10-CM | POA: Diagnosis not present

## 2020-05-10 DIAGNOSIS — R2681 Unsteadiness on feet: Secondary | ICD-10-CM | POA: Diagnosis not present

## 2020-05-10 DIAGNOSIS — R296 Repeated falls: Secondary | ICD-10-CM | POA: Diagnosis not present

## 2020-05-10 DIAGNOSIS — M6281 Muscle weakness (generalized): Secondary | ICD-10-CM | POA: Diagnosis not present

## 2020-05-13 DIAGNOSIS — R2681 Unsteadiness on feet: Secondary | ICD-10-CM | POA: Diagnosis not present

## 2020-05-13 DIAGNOSIS — R296 Repeated falls: Secondary | ICD-10-CM | POA: Diagnosis not present

## 2020-05-13 DIAGNOSIS — M6281 Muscle weakness (generalized): Secondary | ICD-10-CM | POA: Diagnosis not present

## 2020-05-18 DIAGNOSIS — M6281 Muscle weakness (generalized): Secondary | ICD-10-CM | POA: Diagnosis not present

## 2020-05-18 DIAGNOSIS — R296 Repeated falls: Secondary | ICD-10-CM | POA: Diagnosis not present

## 2020-05-18 DIAGNOSIS — R2681 Unsteadiness on feet: Secondary | ICD-10-CM | POA: Diagnosis not present

## 2020-05-20 DIAGNOSIS — R2681 Unsteadiness on feet: Secondary | ICD-10-CM | POA: Diagnosis not present

## 2020-05-20 DIAGNOSIS — R296 Repeated falls: Secondary | ICD-10-CM | POA: Diagnosis not present

## 2020-05-20 DIAGNOSIS — M6281 Muscle weakness (generalized): Secondary | ICD-10-CM | POA: Diagnosis not present

## 2020-05-22 DIAGNOSIS — R296 Repeated falls: Secondary | ICD-10-CM | POA: Diagnosis not present

## 2020-05-22 DIAGNOSIS — M6281 Muscle weakness (generalized): Secondary | ICD-10-CM | POA: Diagnosis not present

## 2020-05-22 DIAGNOSIS — R2681 Unsteadiness on feet: Secondary | ICD-10-CM | POA: Diagnosis not present

## 2020-05-25 DIAGNOSIS — R2681 Unsteadiness on feet: Secondary | ICD-10-CM | POA: Diagnosis not present

## 2020-05-25 DIAGNOSIS — R296 Repeated falls: Secondary | ICD-10-CM | POA: Diagnosis not present

## 2020-05-25 DIAGNOSIS — M6281 Muscle weakness (generalized): Secondary | ICD-10-CM | POA: Diagnosis not present

## 2020-05-27 DIAGNOSIS — R296 Repeated falls: Secondary | ICD-10-CM | POA: Diagnosis not present

## 2020-05-27 DIAGNOSIS — R2681 Unsteadiness on feet: Secondary | ICD-10-CM | POA: Diagnosis not present

## 2020-05-27 DIAGNOSIS — M6281 Muscle weakness (generalized): Secondary | ICD-10-CM | POA: Diagnosis not present

## 2020-05-28 DIAGNOSIS — R296 Repeated falls: Secondary | ICD-10-CM | POA: Diagnosis not present

## 2020-05-28 DIAGNOSIS — M6281 Muscle weakness (generalized): Secondary | ICD-10-CM | POA: Diagnosis not present

## 2020-05-28 DIAGNOSIS — R2681 Unsteadiness on feet: Secondary | ICD-10-CM | POA: Diagnosis not present

## 2020-06-03 DIAGNOSIS — R296 Repeated falls: Secondary | ICD-10-CM | POA: Diagnosis not present

## 2020-06-03 DIAGNOSIS — M6281 Muscle weakness (generalized): Secondary | ICD-10-CM | POA: Diagnosis not present

## 2020-06-03 DIAGNOSIS — R2681 Unsteadiness on feet: Secondary | ICD-10-CM | POA: Diagnosis not present

## 2020-06-05 DIAGNOSIS — R296 Repeated falls: Secondary | ICD-10-CM | POA: Diagnosis not present

## 2020-06-05 DIAGNOSIS — R2681 Unsteadiness on feet: Secondary | ICD-10-CM | POA: Diagnosis not present

## 2020-06-05 DIAGNOSIS — M6281 Muscle weakness (generalized): Secondary | ICD-10-CM | POA: Diagnosis not present

## 2020-06-08 DIAGNOSIS — M6281 Muscle weakness (generalized): Secondary | ICD-10-CM | POA: Diagnosis not present

## 2020-06-08 DIAGNOSIS — R296 Repeated falls: Secondary | ICD-10-CM | POA: Diagnosis not present

## 2020-06-08 DIAGNOSIS — R2681 Unsteadiness on feet: Secondary | ICD-10-CM | POA: Diagnosis not present

## 2020-06-10 DIAGNOSIS — R296 Repeated falls: Secondary | ICD-10-CM | POA: Diagnosis not present

## 2020-06-10 DIAGNOSIS — R2681 Unsteadiness on feet: Secondary | ICD-10-CM | POA: Diagnosis not present

## 2020-06-10 DIAGNOSIS — M6281 Muscle weakness (generalized): Secondary | ICD-10-CM | POA: Diagnosis not present

## 2020-06-12 DIAGNOSIS — R2681 Unsteadiness on feet: Secondary | ICD-10-CM | POA: Diagnosis not present

## 2020-06-12 DIAGNOSIS — R296 Repeated falls: Secondary | ICD-10-CM | POA: Diagnosis not present

## 2020-06-12 DIAGNOSIS — M6281 Muscle weakness (generalized): Secondary | ICD-10-CM | POA: Diagnosis not present

## 2020-06-15 DIAGNOSIS — R296 Repeated falls: Secondary | ICD-10-CM | POA: Diagnosis not present

## 2020-06-15 DIAGNOSIS — M6281 Muscle weakness (generalized): Secondary | ICD-10-CM | POA: Diagnosis not present

## 2020-06-15 DIAGNOSIS — R2681 Unsteadiness on feet: Secondary | ICD-10-CM | POA: Diagnosis not present

## 2020-06-19 DIAGNOSIS — M6281 Muscle weakness (generalized): Secondary | ICD-10-CM | POA: Diagnosis not present

## 2020-06-19 DIAGNOSIS — R296 Repeated falls: Secondary | ICD-10-CM | POA: Diagnosis not present

## 2020-06-19 DIAGNOSIS — R2681 Unsteadiness on feet: Secondary | ICD-10-CM | POA: Diagnosis not present

## 2020-06-21 DIAGNOSIS — R2681 Unsteadiness on feet: Secondary | ICD-10-CM | POA: Diagnosis not present

## 2020-06-21 DIAGNOSIS — M6281 Muscle weakness (generalized): Secondary | ICD-10-CM | POA: Diagnosis not present

## 2020-06-21 DIAGNOSIS — R296 Repeated falls: Secondary | ICD-10-CM | POA: Diagnosis not present

## 2020-06-23 DIAGNOSIS — M6281 Muscle weakness (generalized): Secondary | ICD-10-CM | POA: Diagnosis not present

## 2020-06-23 DIAGNOSIS — R296 Repeated falls: Secondary | ICD-10-CM | POA: Diagnosis not present

## 2020-06-23 DIAGNOSIS — R2681 Unsteadiness on feet: Secondary | ICD-10-CM | POA: Diagnosis not present

## 2020-06-26 DIAGNOSIS — R296 Repeated falls: Secondary | ICD-10-CM | POA: Diagnosis not present

## 2020-06-26 DIAGNOSIS — M6281 Muscle weakness (generalized): Secondary | ICD-10-CM | POA: Diagnosis not present

## 2020-06-26 DIAGNOSIS — R2681 Unsteadiness on feet: Secondary | ICD-10-CM | POA: Diagnosis not present

## 2020-06-29 ENCOUNTER — Ambulatory Visit: Payer: Medicare HMO | Admitting: Podiatry

## 2020-06-29 DIAGNOSIS — M6281 Muscle weakness (generalized): Secondary | ICD-10-CM | POA: Diagnosis not present

## 2020-06-29 DIAGNOSIS — R296 Repeated falls: Secondary | ICD-10-CM | POA: Diagnosis not present

## 2020-06-29 DIAGNOSIS — R2681 Unsteadiness on feet: Secondary | ICD-10-CM | POA: Diagnosis not present

## 2020-06-30 DIAGNOSIS — R296 Repeated falls: Secondary | ICD-10-CM | POA: Diagnosis not present

## 2020-06-30 DIAGNOSIS — M6281 Muscle weakness (generalized): Secondary | ICD-10-CM | POA: Diagnosis not present

## 2020-06-30 DIAGNOSIS — R2681 Unsteadiness on feet: Secondary | ICD-10-CM | POA: Diagnosis not present

## 2020-07-03 DIAGNOSIS — R2681 Unsteadiness on feet: Secondary | ICD-10-CM | POA: Diagnosis not present

## 2020-07-03 DIAGNOSIS — R296 Repeated falls: Secondary | ICD-10-CM | POA: Diagnosis not present

## 2020-07-03 DIAGNOSIS — M6281 Muscle weakness (generalized): Secondary | ICD-10-CM | POA: Diagnosis not present

## 2020-07-08 DIAGNOSIS — R296 Repeated falls: Secondary | ICD-10-CM | POA: Diagnosis not present

## 2020-07-08 DIAGNOSIS — R2681 Unsteadiness on feet: Secondary | ICD-10-CM | POA: Diagnosis not present

## 2020-07-08 DIAGNOSIS — M6281 Muscle weakness (generalized): Secondary | ICD-10-CM | POA: Diagnosis not present

## 2020-07-09 DIAGNOSIS — M6281 Muscle weakness (generalized): Secondary | ICD-10-CM | POA: Diagnosis not present

## 2020-07-09 DIAGNOSIS — R2681 Unsteadiness on feet: Secondary | ICD-10-CM | POA: Diagnosis not present

## 2020-07-09 DIAGNOSIS — R296 Repeated falls: Secondary | ICD-10-CM | POA: Diagnosis not present

## 2020-07-10 DIAGNOSIS — R296 Repeated falls: Secondary | ICD-10-CM | POA: Diagnosis not present

## 2020-07-10 DIAGNOSIS — M6281 Muscle weakness (generalized): Secondary | ICD-10-CM | POA: Diagnosis not present

## 2020-07-10 DIAGNOSIS — R2681 Unsteadiness on feet: Secondary | ICD-10-CM | POA: Diagnosis not present

## 2020-07-13 DIAGNOSIS — M6281 Muscle weakness (generalized): Secondary | ICD-10-CM | POA: Diagnosis not present

## 2020-07-13 DIAGNOSIS — R2681 Unsteadiness on feet: Secondary | ICD-10-CM | POA: Diagnosis not present

## 2020-07-13 DIAGNOSIS — R296 Repeated falls: Secondary | ICD-10-CM | POA: Diagnosis not present

## 2020-07-15 ENCOUNTER — Telehealth: Payer: Self-pay

## 2020-07-15 NOTE — Telephone Encounter (Signed)
Attempted to call patient to schedule an appointment. VM box was full.

## 2020-07-15 NOTE — Telephone Encounter (Signed)
Probably need to check in with his sister or son to make sure he doesn't need to be seen (I am not sure if urgent care makes sense but it might)

## 2020-07-15 NOTE — Telephone Encounter (Signed)
Patients sister called back and stated that patient would only see Dr. Alphonsus Sias and did not want to go to [facility]. Appointment made for tomorrow (3/3) at 3:30 with Dr. Alphonsus Sias.

## 2020-07-15 NOTE — Telephone Encounter (Signed)
[facility] Day - Client TELEPHONE ADVICE RECORD AccessNurse Patient Name: Brad Reed Gender: Male DOB: 10-22-1934 Age: 86 Y 8 M 24 D Return Phone Number: [phone] (Primary), [phone] (Secondary) Address: City/State/Zip: [place] [address] Client [facility] Day - Client Client Site [facility] - Day Physician Tillman Abide- MD Contact Type Call Who Is Calling Patient / Member / Family / Caregiver Call Type Triage / Clinical Caller Name Chauncey Mann Relationship To Patient Sibling Return Phone Number [phone] (Secondary) Chief Complaint Hand or Wrist Injury Reason for Call Symptomatic / Request for Health Information Initial Comment Caller states that brother fell and hurt hand, is cut and turning black. Translation No Nurse Assessment Nurse: Lesly Rubenstein, RN, Crystal Date/Time (Eastern Time): 07/14/2020 5:03:33 PM Confirm and document reason for call. If symptomatic, describe symptoms. ---Caller states that brother fell and hurt hand, is cut and turning black. Caller states he hurt his left hand about a week ago. Was walking across the yard, tripped and fell. Sister thinks he hit concrete due to the damage to his hand. Sister says she has bandaged his hand but he has not seen a doctor. Does the patient have any new or worsening symptoms? ---Yes Will a triage be completed? ---Yes Related visit to physician within the last 2 weeks? ---No Does the PT have any chronic conditions? (i.e. diabetes, asthma, this includes High risk factors for pregnancy, etc.) ---Yes List chronic conditions. ---HTN, Obesity Is this a behavioral health or substance abuse call? ---No Guidelines Guideline Title Affirmed Question Affirmed Notes Nurse Date/Time (Eastern Time) Hand and Wrist Injury Large swelling or bruise (> 2 inches or 5 cm) Parrott, RN, Crystal 07/14/2020 5:05:20 PM Disp. Time Lamount Cohen Time) Disposition  Final User 07/14/2020 5:01:29 PM Attempt made - no message left Parrott, RN, Crystal 07/14/2020 5:13:56 PM See PCP within 24 Hours Yes Parrott, RN, Crystal PLEASE NOTE: All timestamps contained within this report are represented as [place] Standard Time. CONFIDENTIALTY NOTICE: This fax transmission is intended only for the addressee. It contains information that is legally privileged, confidential or otherwise protected from use or disclosure. If you are not the intended recipient, you are strictly prohibited from reviewing, disclosing, copying using or disseminating any of this information or taking any action in reliance on or regarding this information. If you have received this fax in error, please notify us immediately by telephone so that we can arrange for its return to [place]. Phone: [phone], Toll-Free: [phone], Fax: [phone] Page: 2 of 2 Call Id: [id number] Caller Disagree/Comply Comply Caller Understands Yes PreDisposition Call Doctor Care Advice Given Per Guideline SEE PCP WITHIN 24 HOURS: * Use nurse judgment to select the most appropriate source of care. * Consider both the urgency of the patient's symptoms AND what resources may be needed to evaluate and manage the patient. CALL BACK IF: * Pain becomes severe * You become worse CARE ADVICE given per Hand and Wrist Injury (Adult) guideline. REMOVE RINGS: USE A COLD PACK FOR PAIN, SWELLING, OR BRUISING: * Put a cold pack or an ice bag (wrapped in a moist towel) on the area for 20 minutes. Comments User: Floyce Stakes, RN Date/Time Lamount Cohen Time): 07/14/2020 5:01:50 PM Attempted primary number, no answer and mailbox is full. Referrals REFERRED TO PCP OFFICE

## 2020-07-16 ENCOUNTER — Encounter: Payer: Self-pay | Admitting: Internal Medicine

## 2020-07-16 ENCOUNTER — Ambulatory Visit (INDEPENDENT_AMBULATORY_CARE_PROVIDER_SITE_OTHER): Payer: Medicare HMO | Admitting: Internal Medicine

## 2020-07-16 ENCOUNTER — Other Ambulatory Visit: Payer: Self-pay

## 2020-07-16 DIAGNOSIS — S60222A Contusion of left hand, initial encounter: Secondary | ICD-10-CM | POA: Insufficient documentation

## 2020-07-16 NOTE — Progress Notes (Signed)
Subjective:    Patient ID: Brad Reed, male    DOB: [birth year], 86 y.o.   MRN: [id number]  HPI Here due to hand injury last week in a fall This visit occurred during the SARS-CoV-2 public health emergency.  Safety protocols were in place, including screening questions prior to the visit, additional usage of staff PPE, and extensive cleaning of exam room while observing appropriate contact time as indicated for disinfecting solutions.   Larey Seat a week ago---was outside on sidewalk without walker (rollator) Occurred at night "I was stubborn" Fell right onto left hand--braced his body Needed EMS to get back up  Didn't ice it or any Rx Turned black--sister told her it was just bruising Not really painful Has full ROM and still able to use it for walker Is right handed so uses it for utensils, etc  Current Outpatient Medications on File Prior to Visit  Medication Sig Dispense Refill  . multivitamin-lutein (OCUVITE-LUTEIN) CAPS capsule Take 1 capsule by mouth daily.    . potassium chloride (K-DUR) 10 MEQ tablet Take 1 tablet (10 mEq total) by mouth daily. 90 tablet 3  . triamterene-hydrochlorothiazide (DYAZIDE) 37.5-25 MG capsule Take 1 each (1 capsule total) by mouth daily. 90 capsule 3   No current facility-administered medications on file prior to visit.    Allergies  Allergen Reactions  . Ampicillin Hives    Past Medical History:  Diagnosis Date  . Angiodysplasia 02/13/2006  . Arthritis    knees  . Benign prostatic hypertrophy   . Colon polyps   . Diverticulosis of colon   . Hypertension   . Macular degeneration, wet (HCC)    Dr Chyrl Civatte  . Obesity   . Peripheral neuropathy    feet  . Sleep apnea    DX'd when younger. declined CPAP.    Past Surgical History:  Procedure Laterality Date  . APPENDECTOMY    . CATARACT EXTRACTION W/PHACO Left 03/05/2018   Procedure: CATARACT EXTRACTION PHACO AND INTRAOCULAR LENS PLACEMENT (IOC) LEFT;  Surgeon: Nevada Crane, MD;  Location: [facility];  Service: Ophthalmology;  Laterality: Left;  sleep apnea  . CATARACT EXTRACTION W/PHACO Right 03/19/2018   Procedure: CATARACT EXTRACTION PHACO AND INTRAOCULAR LENS PLACEMENT (IOC);  Surgeon: Nevada Crane, MD;  Location: [facility];  Service: Ophthalmology;  Laterality: Right;  . IR GUIDED DRAIN W CATHETER PLACEMENT  07/03/2018  . IR RADIOLOGIST EVAL & MGMT  07/18/2018  . KNEE ARTHROSCOPY     BOTH KNEES  . MASTOID DEBRIDEMENT    . PILONIDAL CYST EXCISION  1960  . TONSILLECTOMY      Family History  Problem Relation Age of Onset  . Cancer Mother   . Obesity Sister   . Diabetes Sister   . Diabetes Maternal Uncle     Social History   Socioeconomic History  . Marital status: Widowed    Spouse name: Not on file  . Number of children: 3  . Years of education: Not on file  . Highest education level: Not on file  Occupational History  . 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 Smoking status: Former Smoker    Types: Cigarettes    Quit date: 05/17/1983    Years since quitting: 37.1  . Smokeless tobacco: Never Used  Vaping Use  . Vaping Use: Never used  Substance and Sexual Activity  . Alcohol use: Yes    Alcohol/week: 14.0 standard  drinks    Types: 14 Shots of liquor per week    Comment: Drinks 2 cocktails /day  . Drug use: No  . Sexual activity: Not on file  Other Topics Concern  . Not on file  Social History Narrative   Has living will    Son [name] is health care POA   Would accept resuscitation but no prolonged artificial life support   No tube feeds if cognitively unaware   Social Determinants of Health   Financial Resource Strain: Not on file  Food Insecurity: Not on file  Transportation Needs: Not on file  Physical Activity: Not on file  Stress: Not on file  Social Connections: Not on file  Intimate Partner Violence: Not on file   Review of Systems   Otherwise feels okay Has lost 5-6 more pounds (actually 19#) Feels the food is awful--had been eating a lot of snack foods before     Objective:   Physical Exam Musculoskeletal:     Comments: Mild swelling of left hand---it is diffusely ecchymotic Full flexion of fingers (other than restriction from the swelling) No bony tenderness at all Wrist is negative Good perfusion            Assessment & Plan:

## 2020-07-16 NOTE — Assessment & Plan Note (Signed)
A week ago No bony tenderness Good ROM Reassured ---it should recover completely in the next 2-3 weeks

## 2020-07-17 DIAGNOSIS — M6281 Muscle weakness (generalized): Secondary | ICD-10-CM | POA: Diagnosis not present

## 2020-07-17 DIAGNOSIS — R296 Repeated falls: Secondary | ICD-10-CM | POA: Diagnosis not present

## 2020-07-17 DIAGNOSIS — R2681 Unsteadiness on feet: Secondary | ICD-10-CM | POA: Diagnosis not present

## 2020-07-19 DIAGNOSIS — M6281 Muscle weakness (generalized): Secondary | ICD-10-CM | POA: Diagnosis not present

## 2020-07-19 DIAGNOSIS — R296 Repeated falls: Secondary | ICD-10-CM | POA: Diagnosis not present

## 2020-07-19 DIAGNOSIS — R2681 Unsteadiness on feet: Secondary | ICD-10-CM | POA: Diagnosis not present

## 2020-07-22 DIAGNOSIS — M6281 Muscle weakness (generalized): Secondary | ICD-10-CM | POA: Diagnosis not present

## 2020-07-22 DIAGNOSIS — R2681 Unsteadiness on feet: Secondary | ICD-10-CM | POA: Diagnosis not present

## 2020-07-22 DIAGNOSIS — R296 Repeated falls: Secondary | ICD-10-CM | POA: Diagnosis not present

## 2020-07-24 DIAGNOSIS — R296 Repeated falls: Secondary | ICD-10-CM | POA: Diagnosis not present

## 2020-07-24 DIAGNOSIS — M6281 Muscle weakness (generalized): Secondary | ICD-10-CM | POA: Diagnosis not present

## 2020-07-24 DIAGNOSIS — R2681 Unsteadiness on feet: Secondary | ICD-10-CM | POA: Diagnosis not present

## 2020-07-27 DIAGNOSIS — R296 Repeated falls: Secondary | ICD-10-CM | POA: Diagnosis not present

## 2020-07-27 DIAGNOSIS — R2681 Unsteadiness on feet: Secondary | ICD-10-CM | POA: Diagnosis not present

## 2020-07-27 DIAGNOSIS — M6281 Muscle weakness (generalized): Secondary | ICD-10-CM | POA: Diagnosis not present

## 2020-07-29 DIAGNOSIS — M6281 Muscle weakness (generalized): Secondary | ICD-10-CM | POA: Diagnosis not present

## 2020-07-29 DIAGNOSIS — R296 Repeated falls: Secondary | ICD-10-CM | POA: Diagnosis not present

## 2020-07-29 DIAGNOSIS — R2681 Unsteadiness on feet: Secondary | ICD-10-CM | POA: Diagnosis not present

## 2020-07-31 DIAGNOSIS — R296 Repeated falls: Secondary | ICD-10-CM | POA: Diagnosis not present

## 2020-07-31 DIAGNOSIS — R2681 Unsteadiness on feet: Secondary | ICD-10-CM | POA: Diagnosis not present

## 2020-07-31 DIAGNOSIS — M6281 Muscle weakness (generalized): Secondary | ICD-10-CM | POA: Diagnosis not present

## 2020-08-04 DIAGNOSIS — M6281 Muscle weakness (generalized): Secondary | ICD-10-CM | POA: Diagnosis not present

## 2020-08-04 DIAGNOSIS — R2681 Unsteadiness on feet: Secondary | ICD-10-CM | POA: Diagnosis not present

## 2020-08-04 DIAGNOSIS — R296 Repeated falls: Secondary | ICD-10-CM | POA: Diagnosis not present

## 2020-08-05 DIAGNOSIS — M6281 Muscle weakness (generalized): Secondary | ICD-10-CM | POA: Diagnosis not present

## 2020-08-05 DIAGNOSIS — R296 Repeated falls: Secondary | ICD-10-CM | POA: Diagnosis not present

## 2020-08-05 DIAGNOSIS — R2681 Unsteadiness on feet: Secondary | ICD-10-CM | POA: Diagnosis not present

## 2020-08-07 DIAGNOSIS — R2681 Unsteadiness on feet: Secondary | ICD-10-CM | POA: Diagnosis not present

## 2020-08-07 DIAGNOSIS — R296 Repeated falls: Secondary | ICD-10-CM | POA: Diagnosis not present

## 2020-08-07 DIAGNOSIS — M6281 Muscle weakness (generalized): Secondary | ICD-10-CM | POA: Diagnosis not present

## 2020-08-09 DIAGNOSIS — M6281 Muscle weakness (generalized): Secondary | ICD-10-CM | POA: Diagnosis not present

## 2020-08-09 DIAGNOSIS — R296 Repeated falls: Secondary | ICD-10-CM | POA: Diagnosis not present

## 2020-08-09 DIAGNOSIS — R2681 Unsteadiness on feet: Secondary | ICD-10-CM | POA: Diagnosis not present

## 2020-08-12 DIAGNOSIS — R2681 Unsteadiness on feet: Secondary | ICD-10-CM | POA: Diagnosis not present

## 2020-08-12 DIAGNOSIS — M6281 Muscle weakness (generalized): Secondary | ICD-10-CM | POA: Diagnosis not present

## 2020-08-12 DIAGNOSIS — R296 Repeated falls: Secondary | ICD-10-CM | POA: Diagnosis not present

## 2020-08-13 DIAGNOSIS — R2681 Unsteadiness on feet: Secondary | ICD-10-CM | POA: Diagnosis not present

## 2020-08-13 DIAGNOSIS — M6281 Muscle weakness (generalized): Secondary | ICD-10-CM | POA: Diagnosis not present

## 2020-08-13 DIAGNOSIS — R296 Repeated falls: Secondary | ICD-10-CM | POA: Diagnosis not present

## 2020-08-17 DIAGNOSIS — M6281 Muscle weakness (generalized): Secondary | ICD-10-CM | POA: Diagnosis not present

## 2020-08-17 DIAGNOSIS — R2681 Unsteadiness on feet: Secondary | ICD-10-CM | POA: Diagnosis not present

## 2020-08-17 DIAGNOSIS — R296 Repeated falls: Secondary | ICD-10-CM | POA: Diagnosis not present

## 2020-08-19 DIAGNOSIS — R296 Repeated falls: Secondary | ICD-10-CM | POA: Diagnosis not present

## 2020-08-19 DIAGNOSIS — M6281 Muscle weakness (generalized): Secondary | ICD-10-CM | POA: Diagnosis not present

## 2020-08-19 DIAGNOSIS — R2681 Unsteadiness on feet: Secondary | ICD-10-CM | POA: Diagnosis not present

## 2020-08-21 DIAGNOSIS — R2681 Unsteadiness on feet: Secondary | ICD-10-CM | POA: Diagnosis not present

## 2020-08-21 DIAGNOSIS — M6281 Muscle weakness (generalized): Secondary | ICD-10-CM | POA: Diagnosis not present

## 2020-08-21 DIAGNOSIS — R296 Repeated falls: Secondary | ICD-10-CM | POA: Diagnosis not present

## 2020-08-23 DIAGNOSIS — R296 Repeated falls: Secondary | ICD-10-CM | POA: Diagnosis not present

## 2020-08-23 DIAGNOSIS — M6281 Muscle weakness (generalized): Secondary | ICD-10-CM | POA: Diagnosis not present

## 2020-08-23 DIAGNOSIS — R2681 Unsteadiness on feet: Secondary | ICD-10-CM | POA: Diagnosis not present

## 2020-08-24 ENCOUNTER — Encounter: Payer: Self-pay | Admitting: Podiatry

## 2020-08-24 ENCOUNTER — Ambulatory Visit: Payer: Medicare HMO | Admitting: Podiatry

## 2020-08-24 ENCOUNTER — Other Ambulatory Visit: Payer: Self-pay

## 2020-08-24 DIAGNOSIS — G629 Polyneuropathy, unspecified: Secondary | ICD-10-CM

## 2020-08-24 DIAGNOSIS — M79676 Pain in unspecified toe(s): Secondary | ICD-10-CM

## 2020-08-24 DIAGNOSIS — B351 Tinea unguium: Secondary | ICD-10-CM

## 2020-08-24 NOTE — Progress Notes (Signed)
This patient returns to my office for at risk foot care.  This patient requires this care by a professional since this patient will be at risk due to having neuropathy.  This patient is unable to cut nails himself since the patient cannot reach his nails.These nails are painful walking and wearing shoes.  This patient presents for at risk foot care today.  General Appearance  Alert, conversant and in no acute stress.  Vascular  Dorsalis pedis and posterior tibial  pulses are weakly palpable  bilaterally.  Capillary return is within normal limits  bilaterally. Temperature is within normal limits  bilaterally.  Neurologic  Senn-Weinstein monofilament wire test absent   bilaterally. Muscle power within normal limits bilaterally.  Nails Thick disfigured discolored nails with subungual debris  from hallux to fifth toes bilaterally. No evidence of bacterial infection or drainage bilaterally.  Orthopedic  No limitations of motion  feet .  No crepitus or effusions noted.  No bony pathology or digital deformities noted.  Skin  normotropic skin with no porokeratosis noted bilaterally.  No signs of infections or ulcers noted.     Onychomycosis  Pain in right toes  Pain in left toes  Consent was obtained for treatment procedures.   Mechanical debridement of nails 1-5  bilaterally performed with a nail nipper.  Filed with dremel without incident.    Return office visit  3 months                    Told patient to return for periodic foot care and evaluation due to potential at risk complications.   Helane Gunther DPM

## 2020-08-27 DIAGNOSIS — R296 Repeated falls: Secondary | ICD-10-CM | POA: Diagnosis not present

## 2020-08-27 DIAGNOSIS — M6281 Muscle weakness (generalized): Secondary | ICD-10-CM | POA: Diagnosis not present

## 2020-08-27 DIAGNOSIS — R2681 Unsteadiness on feet: Secondary | ICD-10-CM | POA: Diagnosis not present

## 2020-09-02 DIAGNOSIS — R2681 Unsteadiness on feet: Secondary | ICD-10-CM | POA: Diagnosis not present

## 2020-09-02 DIAGNOSIS — M6281 Muscle weakness (generalized): Secondary | ICD-10-CM | POA: Diagnosis not present

## 2020-09-02 DIAGNOSIS — R296 Repeated falls: Secondary | ICD-10-CM | POA: Diagnosis not present

## 2020-09-04 DIAGNOSIS — R296 Repeated falls: Secondary | ICD-10-CM | POA: Diagnosis not present

## 2020-09-04 DIAGNOSIS — M6281 Muscle weakness (generalized): Secondary | ICD-10-CM | POA: Diagnosis not present

## 2020-09-04 DIAGNOSIS — R2681 Unsteadiness on feet: Secondary | ICD-10-CM | POA: Diagnosis not present

## 2020-09-09 DIAGNOSIS — R296 Repeated falls: Secondary | ICD-10-CM | POA: Diagnosis not present

## 2020-09-09 DIAGNOSIS — M6281 Muscle weakness (generalized): Secondary | ICD-10-CM | POA: Diagnosis not present

## 2020-09-09 DIAGNOSIS — H353221 Exudative age-related macular degeneration, left eye, with active choroidal neovascularization: Secondary | ICD-10-CM | POA: Diagnosis not present

## 2020-09-09 DIAGNOSIS — R2681 Unsteadiness on feet: Secondary | ICD-10-CM | POA: Diagnosis not present

## 2020-09-11 DIAGNOSIS — M6281 Muscle weakness (generalized): Secondary | ICD-10-CM | POA: Diagnosis not present

## 2020-09-11 DIAGNOSIS — R296 Repeated falls: Secondary | ICD-10-CM | POA: Diagnosis not present

## 2020-09-11 DIAGNOSIS — R2681 Unsteadiness on feet: Secondary | ICD-10-CM | POA: Diagnosis not present

## 2020-09-15 DIAGNOSIS — F0280 Dementia in other diseases classified elsewhere without behavioral disturbance: Secondary | ICD-10-CM | POA: Diagnosis not present

## 2020-09-15 DIAGNOSIS — M6281 Muscle weakness (generalized): Secondary | ICD-10-CM | POA: Diagnosis not present

## 2020-09-15 DIAGNOSIS — R2681 Unsteadiness on feet: Secondary | ICD-10-CM | POA: Diagnosis not present

## 2020-09-15 DIAGNOSIS — R296 Repeated falls: Secondary | ICD-10-CM | POA: Diagnosis not present

## 2020-09-18 ENCOUNTER — Telehealth: Payer: Self-pay

## 2020-09-18 DIAGNOSIS — R2681 Unsteadiness on feet: Secondary | ICD-10-CM | POA: Diagnosis not present

## 2020-09-18 DIAGNOSIS — F0280 Dementia in other diseases classified elsewhere without behavioral disturbance: Secondary | ICD-10-CM | POA: Diagnosis not present

## 2020-09-18 DIAGNOSIS — R296 Repeated falls: Secondary | ICD-10-CM | POA: Diagnosis not present

## 2020-09-18 DIAGNOSIS — M6281 Muscle weakness (generalized): Secondary | ICD-10-CM | POA: Diagnosis not present

## 2020-09-18 NOTE — Telephone Encounter (Signed)
The phone number provided is no longer in service. Called pt's sister to have him call me so I can clear this up for him.

## 2020-09-18 NOTE — Telephone Encounter (Signed)
I spoke to pt and advised him that it was safe for him to take the triamterene-hctz. He will start back today.

## 2020-09-18 NOTE — Telephone Encounter (Signed)
The recall was on certain lots of pfizer accupril/hctz and quinapril/hctz. I will advise him of that.

## 2020-09-18 NOTE — Telephone Encounter (Signed)
I didn't hear of a recall but okay to change him to losartan 50/12.5 with 1 year fill

## 2020-09-18 NOTE — Telephone Encounter (Signed)
Patient called and was being seen with [facility] PT at the time who stated that patients B/P was 167/95. Patient has experienced SOB and increased weakness with light activity. Patient stated that he stopped taking his triamterene-hydrochlorothiazide three days ago because it was re-called. Patient is wanting to switch to another medication. UC and ED precautions given. Patient verbalized understanding. Please advise.

## 2020-09-20 NOTE — Telephone Encounter (Signed)
Okay---glad it got cleared up

## 2020-09-23 ENCOUNTER — Telehealth: Payer: Self-pay

## 2020-09-23 DIAGNOSIS — R296 Repeated falls: Secondary | ICD-10-CM | POA: Diagnosis not present

## 2020-09-23 DIAGNOSIS — R2681 Unsteadiness on feet: Secondary | ICD-10-CM | POA: Diagnosis not present

## 2020-09-23 DIAGNOSIS — F0280 Dementia in other diseases classified elsewhere without behavioral disturbance: Secondary | ICD-10-CM | POA: Diagnosis not present

## 2020-09-23 DIAGNOSIS — M6281 Muscle weakness (generalized): Secondary | ICD-10-CM | POA: Diagnosis not present

## 2020-09-23 NOTE — Telephone Encounter (Signed)
Pt's daughter-in-law, Jamere Stidham, came to the clinic requesting help that she believes the pt is going to commit suicide. Advised no information can be released to her because she is not on the pt's DPR. She reported her husband is the pt's POA and he works up the street at [employer] and is a International aid/development worker. She said he could not come to the clinic and does not know what to do. She was in distress in the office and tearful that she believed the pt is a harm to himself. She said he will not even talk to her anymore. Pt lives in an assisted living facility with his sister but Steward Drone reports she is in poor health also and not able to help him. She reports the facility called EMS and they came to assess him but he said he did not say he was going to hurt himself and he refused to go with them so they left. Steward Drone stated, "It is not right there is nothing we can due until this man is lying on the floor. Isn't there something we can do?" Advised she could take pt to ER or call EMS again if she thought pt was having suicidal thoughts. Advised also that someone may want to stay with the pt so he can be observed. Steward Drone then stood up and stated, "So there is nothing you can do". She then walked out of the office.

## 2020-09-23 NOTE — Telephone Encounter (Signed)
Patient called back and stated that he has not and currently does not have thoughts of harming himself or anyone else. Patient stated, "I asked if anyone in my retirement community has ever tried to commit suicide and now all of a sudden everyone is interested in me. I don't know how my son found out." Instructed patient that if he had these thoughts he should seek help immediately and call 911. Patient verbalized understanding. Patient did give permission to speak with his son if he calls back.
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
## 2020-09-24 NOTE — Telephone Encounter (Signed)
Spoke to [name]. He really feels his dad is senile. He doesn't do laundry. Doesn't clean up after himself. He is just very concerned. I will call the pt tomorrow and see if I can get him to come in and see Dr Alphonsus Sias.

## 2020-09-24 NOTE — Telephone Encounter (Signed)
This is not a new issue The problem is that he really doesn't have dementia--but does have poor judgement (and is depressed). Let [name] know there is a driving assessment firm available locally--but costs hundreds of dollars (and patient won't agree--but we can try) He should report his concerns to Motor Vehicles--they may require a road test to be sure he can drive safely  Okay to make appt for patient to see me so we can discuss this again

## 2020-09-26 DIAGNOSIS — M6281 Muscle weakness (generalized): Secondary | ICD-10-CM | POA: Diagnosis not present

## 2020-09-26 DIAGNOSIS — R296 Repeated falls: Secondary | ICD-10-CM | POA: Diagnosis not present

## 2020-09-26 DIAGNOSIS — F0280 Dementia in other diseases classified elsewhere without behavioral disturbance: Secondary | ICD-10-CM | POA: Diagnosis not present

## 2020-09-26 DIAGNOSIS — R2681 Unsteadiness on feet: Secondary | ICD-10-CM | POA: Diagnosis not present

## 2020-09-28 DIAGNOSIS — I739 Peripheral vascular disease, unspecified: Secondary | ICD-10-CM | POA: Diagnosis not present

## 2020-09-28 DIAGNOSIS — F32A Depression, unspecified: Secondary | ICD-10-CM | POA: Diagnosis not present

## 2020-09-28 DIAGNOSIS — Z7189 Other specified counseling: Secondary | ICD-10-CM | POA: Diagnosis not present

## 2020-09-28 DIAGNOSIS — I10 Essential (primary) hypertension: Secondary | ICD-10-CM | POA: Diagnosis not present

## 2020-09-28 DIAGNOSIS — G629 Polyneuropathy, unspecified: Secondary | ICD-10-CM | POA: Diagnosis not present

## 2020-09-28 DIAGNOSIS — R2681 Unsteadiness on feet: Secondary | ICD-10-CM | POA: Diagnosis not present

## 2020-09-28 DIAGNOSIS — H3530 Unspecified macular degeneration: Secondary | ICD-10-CM | POA: Diagnosis not present

## 2020-09-30 DIAGNOSIS — R2681 Unsteadiness on feet: Secondary | ICD-10-CM | POA: Diagnosis not present

## 2020-09-30 DIAGNOSIS — F0280 Dementia in other diseases classified elsewhere without behavioral disturbance: Secondary | ICD-10-CM | POA: Diagnosis not present

## 2020-09-30 DIAGNOSIS — R296 Repeated falls: Secondary | ICD-10-CM | POA: Diagnosis not present

## 2020-09-30 DIAGNOSIS — M6281 Muscle weakness (generalized): Secondary | ICD-10-CM | POA: Diagnosis not present

## 2020-10-01 NOTE — Telephone Encounter (Signed)
Left message for pt to call office

## 2020-10-02 DIAGNOSIS — R296 Repeated falls: Secondary | ICD-10-CM | POA: Diagnosis not present

## 2020-10-02 DIAGNOSIS — F0280 Dementia in other diseases classified elsewhere without behavioral disturbance: Secondary | ICD-10-CM | POA: Diagnosis not present

## 2020-10-02 DIAGNOSIS — R2681 Unsteadiness on feet: Secondary | ICD-10-CM | POA: Diagnosis not present

## 2020-10-02 DIAGNOSIS — M6281 Muscle weakness (generalized): Secondary | ICD-10-CM | POA: Diagnosis not present

## 2020-10-05 ENCOUNTER — Telehealth (INDEPENDENT_AMBULATORY_CARE_PROVIDER_SITE_OTHER): Payer: Self-pay

## 2020-10-05 DIAGNOSIS — R2681 Unsteadiness on feet: Secondary | ICD-10-CM | POA: Diagnosis not present

## 2020-10-05 DIAGNOSIS — M6281 Muscle weakness (generalized): Secondary | ICD-10-CM | POA: Diagnosis not present

## 2020-10-05 DIAGNOSIS — R296 Repeated falls: Secondary | ICD-10-CM | POA: Diagnosis not present

## 2020-10-05 DIAGNOSIS — F0280 Dementia in other diseases classified elsewhere without behavioral disturbance: Secondary | ICD-10-CM | POA: Diagnosis not present

## 2020-10-05 NOTE — Telephone Encounter (Signed)
I cancelled appt that I scheduled today with sister of pt due to pt needing an ABI before the consult.   new. PVD. ABI + consult. gregory, traci.

## 2020-10-07 ENCOUNTER — Other Ambulatory Visit (INDEPENDENT_AMBULATORY_CARE_PROVIDER_SITE_OTHER): Payer: Self-pay | Admitting: Vascular Surgery

## 2020-10-07 DIAGNOSIS — I739 Peripheral vascular disease, unspecified: Secondary | ICD-10-CM

## 2020-10-07 DIAGNOSIS — R296 Repeated falls: Secondary | ICD-10-CM | POA: Diagnosis not present

## 2020-10-07 DIAGNOSIS — I10 Essential (primary) hypertension: Secondary | ICD-10-CM | POA: Diagnosis not present

## 2020-10-07 DIAGNOSIS — R2681 Unsteadiness on feet: Secondary | ICD-10-CM | POA: Diagnosis not present

## 2020-10-07 DIAGNOSIS — F0280 Dementia in other diseases classified elsewhere without behavioral disturbance: Secondary | ICD-10-CM | POA: Diagnosis not present

## 2020-10-07 DIAGNOSIS — L819 Disorder of pigmentation, unspecified: Secondary | ICD-10-CM

## 2020-10-07 DIAGNOSIS — M6281 Muscle weakness (generalized): Secondary | ICD-10-CM | POA: Diagnosis not present

## 2020-10-07 NOTE — Telephone Encounter (Signed)
Tried calling patient but did not answer LVM for him to call office.

## 2020-10-08 ENCOUNTER — Ambulatory Visit (INDEPENDENT_AMBULATORY_CARE_PROVIDER_SITE_OTHER): Payer: Medicare HMO

## 2020-10-08 ENCOUNTER — Other Ambulatory Visit: Payer: Self-pay

## 2020-10-08 ENCOUNTER — Encounter (INDEPENDENT_AMBULATORY_CARE_PROVIDER_SITE_OTHER): Payer: Self-pay | Admitting: Vascular Surgery

## 2020-10-08 ENCOUNTER — Ambulatory Visit (INDEPENDENT_AMBULATORY_CARE_PROVIDER_SITE_OTHER): Payer: Medicare HMO | Admitting: Vascular Surgery

## 2020-10-08 VITALS — BP 145/85 | HR 90 | Ht 68.0 in | Wt 255.0 lb

## 2020-10-08 DIAGNOSIS — L819 Disorder of pigmentation, unspecified: Secondary | ICD-10-CM | POA: Diagnosis not present

## 2020-10-08 DIAGNOSIS — I739 Peripheral vascular disease, unspecified: Secondary | ICD-10-CM | POA: Diagnosis not present

## 2020-10-08 DIAGNOSIS — I499 Cardiac arrhythmia, unspecified: Secondary | ICD-10-CM | POA: Diagnosis not present

## 2020-10-08 DIAGNOSIS — I10 Essential (primary) hypertension: Secondary | ICD-10-CM | POA: Diagnosis not present

## 2020-10-08 NOTE — Progress Notes (Signed)
MRN : [id number]  Brad Reed is a 86 y.o. (26-Sep-1934) male who presents with chief complaint of No chief complaint on file. Marland Kitchen  History of Present Illness:    The patient is seen for evaluation of painful lower extremities and diminished pulses. Patient notes the pain is always associated with activity and is very consistent day today. Typically, the pain occurs at less than one block, progress is as activity continues to the point that the patient must stop walking. Resting including standing still for several minutes allowed resumption of the activity and the ability to walk a similar distance before stopping again. Uneven terrain and inclined shorten the distance. The pain has been progressive over the past several years. The patient states the inability to walk is now having a profound negative impact on quality of life and daily activities.  The patient denies rest pain or dangling of an extremity off the side of the bed during the night for relief. No open wounds or sores at this time. No prior interventions or surgeries.  No history of back problems or DJD of the lumbar sacral spine.   The patient denies changes in claudication symptoms or new rest pain symptoms.  No new ulcers or wounds of the foot.  The patient's blood pressure has been stable and relatively well controlled. The patient denies amaurosis fugax or recent TIA symptoms. There are no recent neurological changes noted. The patient denies history of DVT, PE or superficial thrombophlebitis. The patient denies recent episodes of angina or shortness of breath.   ABI's are normal bilaterally  No outpatient medications have been marked as taking for the 10/08/20 encounter (Appointment) with Gilda Crease, Latina Craver, MD.    Past Medical History:  Diagnosis Date  . Angiodysplasia 02/13/2006  . Arthritis    knees  . Benign prostatic hypertrophy   . Colon polyps   . Diverticulosis of colon   . Hypertension   . Macular  degeneration, wet (HCC)    Dr Chyrl Civatte  . Obesity   . Peripheral neuropathy    feet  . Sleep apnea    DX'd when younger. declined CPAP.    Past Surgical History:  Procedure Laterality Date  . APPENDECTOMY    . CATARACT EXTRACTION W/PHACO Left 03/05/2018   Procedure: CATARACT EXTRACTION PHACO AND INTRAOCULAR LENS PLACEMENT (IOC) LEFT;  Surgeon: Nevada Crane, MD;  Location: [facility];  Service: Ophthalmology;  Laterality: Left;  sleep apnea  . CATARACT EXTRACTION W/PHACO Right 03/19/2018   Procedure: CATARACT EXTRACTION PHACO AND INTRAOCULAR LENS PLACEMENT (IOC);  Surgeon: Nevada Crane, MD;  Location: [facility];  Service: Ophthalmology;  Laterality: Right;  . IR GUIDED DRAIN W CATHETER PLACEMENT  07/03/2018  . IR RADIOLOGIST EVAL & MGMT  07/18/2018  . KNEE ARTHROSCOPY     BOTH KNEES  . MASTOID DEBRIDEMENT    . PILONIDAL CYST EXCISION  1960  . TONSILLECTOMY      Social History Social History   Tobacco Use  . Smoking status: Former Smoker    Types: Cigarettes    Quit date: 05/17/1983    Years since quitting: 37.4  . Smokeless tobacco: Never Used  Vaping Use  . Vaping Use: Never used  Substance Use Topics  . Alcohol use: Yes    Alcohol/week: 14.0 standard drinks    Types: 14 Shots of liquor per week    Comment: Drinks 2 cocktails /day  . Drug use: No    Family History Family  History  Problem Relation Age of Onset  . Cancer Mother   . Obesity Sister   . Diabetes Sister   . Diabetes Maternal Uncle   No family history of bleeding/clotting disorders, porphyria or autoimmune disease   Allergies  Allergen Reactions  . Ampicillin Hives     REVIEW OF SYSTEMS (Negative unless checked)  Constitutional: [] Weight loss  [] Fever  [] Chills Cardiac: [] Chest pain   [] Chest pressure   [] Palpitations   [] Shortness of breath when laying flat   [] Shortness of breath with exertion. Vascular:  [] Pain in legs with walking   [x] Pain in legs at rest   [] History of DVT   [] Phlebitis   [x] Swelling in legs   [] Varicose veins   [] Non-healing ulcers Pulmonary:   [] Uses home oxygen   [] Productive cough   [] Hemoptysis   [] Wheeze  [] COPD   [] Asthma Neurologic:  [] Dizziness   [] Seizures   [] History of stroke   [] History of TIA  [] Aphasia   [] Vissual changes   [] Weakness or numbness in arm   [x] Weakness or numbness in leg Musculoskeletal:   [] Joint swelling   [x] Joint pain   [] Low back pain Hematologic:  [] Easy bruising  [] Easy bleeding   [] Hypercoagulable state   [] Anemic Gastrointestinal:  [] Diarrhea   [] Vomiting  [] Gastroesophageal reflux/heartburn   [] Difficulty swallowing. Genitourinary:  [] Chronic kidney disease   [] Difficult urination  [] Frequent urination   [] Blood in urine Skin:  [] Rashes   [] Ulcers  Psychological:  [] History of anxiety   []  History of major depression.  Physical Examination  There were no vitals filed for this visit. There is no height or weight on file to calculate BMI. Gen: WD/WN, NAD Head: [place]/AT, No temporalis wasting.  Ear/Nose/Throat: Hearing grossly intact, nares w/o erythema or drainage, poor dentition Eyes: PER, EOMI, sclera nonicteric.  Neck: Supple, no masses.  No bruit or JVD.  Pulmonary:  Good air movement, clear to auscultation bilaterally, no use of accessory muscles.  Cardiac: RRR, normal S1, S2, no Murmurs. Vascular: scattered varicosities present bilaterally.  Mild venous stasis changes to the legs bilaterally.  2+ soft pitting edema Vessel Right Left  Radial Palpable Palpable  PT Not Palpable Not Palpable  DP Not Palpable Not Palpable  Gastrointestinal: soft, non-distended. No guarding/no peritoneal signs.  Musculoskeletal: M/S 5/5 throughout.  No deformity or atrophy.  Neurologic: CN 2-12 intact. Pain and light touch intact in extremities.  Symmetrical.  Speech is fluent. Motor exam as listed above. Psychiatric: Judgment intact, Mood & affect appropriate for pt's clinical situation. Dermatologic:  Mld venous rashes no ulcers noted.  No changes consistent with cellulitis.   CBC Lab Results  Component Value Date   WBC 7.5 04/06/2020   HGB 16.4 04/06/2020   HCT 48.8 04/06/2020   MCV 97.1 04/06/2020   PLT 316.0 04/06/2020    BMET    Component Value Date/Time   NA 140 04/06/2020 1603   K 3.8 04/06/2020 1603   CL 101 04/06/2020 1603   CO2 30 04/06/2020 1603   GLUCOSE 135 (H) 04/06/2020 1603   BUN 11 04/06/2020 1603   CREATININE 1.04 04/06/2020 1603   CALCIUM 9.6 04/06/2020 1603   GFRNONAA >60 07/07/2018 0251   GFRAA >60 07/07/2018 0251   CrCl cannot be calculated (Patient's most recent lab result is older than the maximum 21 days allowed.).  COAG No results found for: INR, PROTIME  Radiology No results found.   Assessment/Plan 1. PAD (peripheral artery disease) (HCC) Recommend:  I do not find evidence of  Vascular pathology that would explain the patient's symptoms  The patient has atypical pain symptoms for vascular disease  I do not find evidence of Vascular pathology that would explain the patient's symptoms and I suspect the patient is c/o pseudoclaudication.  Patient should have an evaluation of his LS spine which I defer to the primary service.  Noninvasive studies including venous ultrasound of the legs do not identify vascular problems  The patient should continue walking and begin a more formal exercise program. The patient should continue his antiplatelet therapy and aggressive treatment of the lipid abnormalities. The patient should begin wearing graduated compression socks 15-20 mmHg strength to control her mild edema.  Patient will follow-up with me on a PRN basis  Further work-up of her lower extremity pain is deferred to the primary service     2. Essential hypertension, benign Continue antihypertensive medications as already ordered, these medications have been reviewed and there are no changes at this time.   3. Cardiac arrhythmia,  unspecified cardiac arrhythmia type Continue antiarrhythmia medications as already ordered, these medications have been reviewed and there are no changes at this time.  Continue anticoagulation as ordered by Cardiology Service     Levora Dredge, MD  10/08/2020 10:09 AM

## 2020-10-09 ENCOUNTER — Encounter (INDEPENDENT_AMBULATORY_CARE_PROVIDER_SITE_OTHER): Payer: Self-pay | Admitting: Vascular Surgery

## 2020-10-09 DIAGNOSIS — M6281 Muscle weakness (generalized): Secondary | ICD-10-CM | POA: Diagnosis not present

## 2020-10-09 DIAGNOSIS — F0280 Dementia in other diseases classified elsewhere without behavioral disturbance: Secondary | ICD-10-CM | POA: Diagnosis not present

## 2020-10-09 DIAGNOSIS — R2681 Unsteadiness on feet: Secondary | ICD-10-CM | POA: Diagnosis not present

## 2020-10-09 DIAGNOSIS — R296 Repeated falls: Secondary | ICD-10-CM | POA: Diagnosis not present

## 2020-10-12 DIAGNOSIS — R296 Repeated falls: Secondary | ICD-10-CM | POA: Diagnosis not present

## 2020-10-12 DIAGNOSIS — M6281 Muscle weakness (generalized): Secondary | ICD-10-CM | POA: Diagnosis not present

## 2020-10-12 DIAGNOSIS — R2681 Unsteadiness on feet: Secondary | ICD-10-CM | POA: Diagnosis not present

## 2020-10-12 DIAGNOSIS — F0280 Dementia in other diseases classified elsewhere without behavioral disturbance: Secondary | ICD-10-CM | POA: Diagnosis not present

## 2020-10-13 DIAGNOSIS — F0280 Dementia in other diseases classified elsewhere without behavioral disturbance: Secondary | ICD-10-CM | POA: Diagnosis not present

## 2020-10-13 DIAGNOSIS — M6281 Muscle weakness (generalized): Secondary | ICD-10-CM | POA: Diagnosis not present

## 2020-10-13 DIAGNOSIS — R296 Repeated falls: Secondary | ICD-10-CM | POA: Diagnosis not present

## 2020-10-13 DIAGNOSIS — R2681 Unsteadiness on feet: Secondary | ICD-10-CM | POA: Diagnosis not present

## 2020-10-15 DIAGNOSIS — R296 Repeated falls: Secondary | ICD-10-CM | POA: Diagnosis not present

## 2020-10-15 DIAGNOSIS — F0280 Dementia in other diseases classified elsewhere without behavioral disturbance: Secondary | ICD-10-CM | POA: Diagnosis not present

## 2020-10-15 DIAGNOSIS — M6281 Muscle weakness (generalized): Secondary | ICD-10-CM | POA: Diagnosis not present

## 2020-10-15 DIAGNOSIS — R2681 Unsteadiness on feet: Secondary | ICD-10-CM | POA: Diagnosis not present

## 2020-10-16 DIAGNOSIS — M6281 Muscle weakness (generalized): Secondary | ICD-10-CM | POA: Diagnosis not present

## 2020-10-16 DIAGNOSIS — R2681 Unsteadiness on feet: Secondary | ICD-10-CM | POA: Diagnosis not present

## 2020-10-16 DIAGNOSIS — F0280 Dementia in other diseases classified elsewhere without behavioral disturbance: Secondary | ICD-10-CM | POA: Diagnosis not present

## 2020-10-16 DIAGNOSIS — R296 Repeated falls: Secondary | ICD-10-CM | POA: Diagnosis not present

## 2020-10-19 ENCOUNTER — Encounter (INDEPENDENT_AMBULATORY_CARE_PROVIDER_SITE_OTHER): Payer: Medicare HMO | Admitting: Vascular Surgery

## 2020-10-20 DIAGNOSIS — R296 Repeated falls: Secondary | ICD-10-CM | POA: Diagnosis not present

## 2020-10-20 DIAGNOSIS — F0280 Dementia in other diseases classified elsewhere without behavioral disturbance: Secondary | ICD-10-CM | POA: Diagnosis not present

## 2020-10-20 DIAGNOSIS — M6281 Muscle weakness (generalized): Secondary | ICD-10-CM | POA: Diagnosis not present

## 2020-10-20 DIAGNOSIS — R2681 Unsteadiness on feet: Secondary | ICD-10-CM | POA: Diagnosis not present

## 2020-10-22 DIAGNOSIS — M6281 Muscle weakness (generalized): Secondary | ICD-10-CM | POA: Diagnosis not present

## 2020-10-22 DIAGNOSIS — R296 Repeated falls: Secondary | ICD-10-CM | POA: Diagnosis not present

## 2020-10-22 DIAGNOSIS — R2681 Unsteadiness on feet: Secondary | ICD-10-CM | POA: Diagnosis not present

## 2020-10-22 DIAGNOSIS — F0280 Dementia in other diseases classified elsewhere without behavioral disturbance: Secondary | ICD-10-CM | POA: Diagnosis not present

## 2020-10-23 DIAGNOSIS — R296 Repeated falls: Secondary | ICD-10-CM | POA: Diagnosis not present

## 2020-10-23 DIAGNOSIS — F0280 Dementia in other diseases classified elsewhere without behavioral disturbance: Secondary | ICD-10-CM | POA: Diagnosis not present

## 2020-10-23 DIAGNOSIS — R2681 Unsteadiness on feet: Secondary | ICD-10-CM | POA: Diagnosis not present

## 2020-10-23 DIAGNOSIS — M6281 Muscle weakness (generalized): Secondary | ICD-10-CM | POA: Diagnosis not present

## 2020-10-27 DIAGNOSIS — M6281 Muscle weakness (generalized): Secondary | ICD-10-CM | POA: Diagnosis not present

## 2020-10-27 DIAGNOSIS — F0280 Dementia in other diseases classified elsewhere without behavioral disturbance: Secondary | ICD-10-CM | POA: Diagnosis not present

## 2020-10-27 DIAGNOSIS — R2681 Unsteadiness on feet: Secondary | ICD-10-CM | POA: Diagnosis not present

## 2020-10-27 DIAGNOSIS — R296 Repeated falls: Secondary | ICD-10-CM | POA: Diagnosis not present

## 2020-10-28 DIAGNOSIS — M6281 Muscle weakness (generalized): Secondary | ICD-10-CM | POA: Diagnosis not present

## 2020-10-28 DIAGNOSIS — R2681 Unsteadiness on feet: Secondary | ICD-10-CM | POA: Diagnosis not present

## 2020-10-28 DIAGNOSIS — F0280 Dementia in other diseases classified elsewhere without behavioral disturbance: Secondary | ICD-10-CM | POA: Diagnosis not present

## 2020-10-28 DIAGNOSIS — R296 Repeated falls: Secondary | ICD-10-CM | POA: Diagnosis not present

## 2020-10-29 DIAGNOSIS — R296 Repeated falls: Secondary | ICD-10-CM | POA: Diagnosis not present

## 2020-10-29 DIAGNOSIS — F0280 Dementia in other diseases classified elsewhere without behavioral disturbance: Secondary | ICD-10-CM | POA: Diagnosis not present

## 2020-10-29 DIAGNOSIS — M6281 Muscle weakness (generalized): Secondary | ICD-10-CM | POA: Diagnosis not present

## 2020-10-29 DIAGNOSIS — R2681 Unsteadiness on feet: Secondary | ICD-10-CM | POA: Diagnosis not present

## 2020-11-02 DIAGNOSIS — R296 Repeated falls: Secondary | ICD-10-CM | POA: Diagnosis not present

## 2020-11-02 DIAGNOSIS — M6281 Muscle weakness (generalized): Secondary | ICD-10-CM | POA: Diagnosis not present

## 2020-11-02 DIAGNOSIS — F0280 Dementia in other diseases classified elsewhere without behavioral disturbance: Secondary | ICD-10-CM | POA: Diagnosis not present

## 2020-11-02 DIAGNOSIS — R2681 Unsteadiness on feet: Secondary | ICD-10-CM | POA: Diagnosis not present

## 2020-11-04 DIAGNOSIS — R296 Repeated falls: Secondary | ICD-10-CM | POA: Diagnosis not present

## 2020-11-04 DIAGNOSIS — M6281 Muscle weakness (generalized): Secondary | ICD-10-CM | POA: Diagnosis not present

## 2020-11-04 DIAGNOSIS — F0280 Dementia in other diseases classified elsewhere without behavioral disturbance: Secondary | ICD-10-CM | POA: Diagnosis not present

## 2020-11-04 DIAGNOSIS — R2681 Unsteadiness on feet: Secondary | ICD-10-CM | POA: Diagnosis not present

## 2020-11-06 DIAGNOSIS — F0280 Dementia in other diseases classified elsewhere without behavioral disturbance: Secondary | ICD-10-CM | POA: Diagnosis not present

## 2020-11-06 DIAGNOSIS — R2681 Unsteadiness on feet: Secondary | ICD-10-CM | POA: Diagnosis not present

## 2020-11-06 DIAGNOSIS — M6281 Muscle weakness (generalized): Secondary | ICD-10-CM | POA: Diagnosis not present

## 2020-11-06 DIAGNOSIS — R296 Repeated falls: Secondary | ICD-10-CM | POA: Diagnosis not present

## 2020-11-08 ENCOUNTER — Emergency Department: Payer: Medicare HMO

## 2020-11-08 ENCOUNTER — Inpatient Hospital Stay
Admission: EM | Admit: 2020-11-08 | Discharge: 2020-11-19 | DRG: 854 | Disposition: A | Discharge status: Skilled Nursing Facility | Payer: Medicare HMO | Attending: Hospitalist | Admitting: Hospitalist

## 2020-11-08 DIAGNOSIS — I712 Thoracic aortic aneurysm, without rupture: Secondary | ICD-10-CM | POA: Diagnosis not present

## 2020-11-08 DIAGNOSIS — R000 Tachycardia, unspecified: Secondary | ICD-10-CM | POA: Diagnosis not present

## 2020-11-08 DIAGNOSIS — Z8719 Personal history of other diseases of the digestive system: Secondary | ICD-10-CM

## 2020-11-08 DIAGNOSIS — Z20822 Contact with and (suspected) exposure to covid-19: Secondary | ICD-10-CM | POA: Diagnosis not present

## 2020-11-08 DIAGNOSIS — A411 Sepsis due to other specified staphylococcus: Secondary | ICD-10-CM | POA: Diagnosis not present

## 2020-11-08 DIAGNOSIS — E872 Acidosis, unspecified: Secondary | ICD-10-CM | POA: Diagnosis present

## 2020-11-08 DIAGNOSIS — R531 Weakness: Secondary | ICD-10-CM | POA: Diagnosis not present

## 2020-11-08 DIAGNOSIS — Z833 Family history of diabetes mellitus: Secondary | ICD-10-CM

## 2020-11-08 DIAGNOSIS — G629 Polyneuropathy, unspecified: Secondary | ICD-10-CM | POA: Diagnosis present

## 2020-11-08 DIAGNOSIS — H353220 Exudative age-related macular degeneration, left eye, stage unspecified: Secondary | ICD-10-CM | POA: Diagnosis present

## 2020-11-08 DIAGNOSIS — M2550 Pain in unspecified joint: Secondary | ICD-10-CM | POA: Diagnosis not present

## 2020-11-08 DIAGNOSIS — Z66 Do not resuscitate: Secondary | ICD-10-CM | POA: Diagnosis present

## 2020-11-08 DIAGNOSIS — K8020 Calculus of gallbladder without cholecystitis without obstruction: Secondary | ICD-10-CM | POA: Diagnosis not present

## 2020-11-08 DIAGNOSIS — R112 Nausea with vomiting, unspecified: Secondary | ICD-10-CM | POA: Diagnosis not present

## 2020-11-08 DIAGNOSIS — F4329 Adjustment disorder with other symptoms: Secondary | ICD-10-CM | POA: Diagnosis not present

## 2020-11-08 DIAGNOSIS — R001 Bradycardia, unspecified: Secondary | ICD-10-CM | POA: Diagnosis not present

## 2020-11-08 DIAGNOSIS — A419 Sepsis, unspecified organism: Secondary | ICD-10-CM

## 2020-11-08 DIAGNOSIS — E876 Hypokalemia: Secondary | ICD-10-CM | POA: Diagnosis not present

## 2020-11-08 DIAGNOSIS — M1990 Unspecified osteoarthritis, unspecified site: Secondary | ICD-10-CM | POA: Diagnosis present

## 2020-11-08 DIAGNOSIS — K8012 Calculus of gallbladder with acute and chronic cholecystitis without obstruction: Secondary | ICD-10-CM | POA: Diagnosis not present

## 2020-11-08 DIAGNOSIS — M1711 Unilateral primary osteoarthritis, right knee: Secondary | ICD-10-CM | POA: Diagnosis present

## 2020-11-08 DIAGNOSIS — I10 Essential (primary) hypertension: Secondary | ICD-10-CM

## 2020-11-08 DIAGNOSIS — F4323 Adjustment disorder with mixed anxiety and depressed mood: Secondary | ICD-10-CM | POA: Diagnosis present

## 2020-11-08 DIAGNOSIS — E669 Obesity, unspecified: Secondary | ICD-10-CM | POA: Diagnosis present

## 2020-11-08 DIAGNOSIS — K8062 Calculus of gallbladder and bile duct with acute cholecystitis without obstruction: Secondary | ICD-10-CM | POA: Diagnosis present

## 2020-11-08 DIAGNOSIS — Z79899 Other long term (current) drug therapy: Secondary | ICD-10-CM | POA: Diagnosis not present

## 2020-11-08 DIAGNOSIS — K810 Acute cholecystitis: Secondary | ICD-10-CM

## 2020-11-08 DIAGNOSIS — R29810 Facial weakness: Secondary | ICD-10-CM | POA: Diagnosis not present

## 2020-11-08 DIAGNOSIS — Z880 Allergy status to penicillin: Secondary | ICD-10-CM

## 2020-11-08 DIAGNOSIS — Z6839 Body mass index (BMI) 39.0-39.9, adult: Secondary | ICD-10-CM

## 2020-11-08 DIAGNOSIS — Y838 Other surgical procedures as the cause of abnormal reaction of the patient, or of later complication, without mention of misadventure at the time of the procedure: Secondary | ICD-10-CM | POA: Diagnosis not present

## 2020-11-08 DIAGNOSIS — K8050 Calculus of bile duct without cholangitis or cholecystitis without obstruction: Secondary | ICD-10-CM

## 2020-11-08 DIAGNOSIS — I452 Bifascicular block: Secondary | ICD-10-CM | POA: Diagnosis present

## 2020-11-08 DIAGNOSIS — K9171 Accidental puncture and laceration of a digestive system organ or structure during a digestive system procedure: Secondary | ICD-10-CM | POA: Diagnosis not present

## 2020-11-08 DIAGNOSIS — R17 Unspecified jaundice: Secondary | ICD-10-CM | POA: Diagnosis not present

## 2020-11-08 DIAGNOSIS — L304 Erythema intertrigo: Secondary | ICD-10-CM | POA: Diagnosis present

## 2020-11-08 DIAGNOSIS — G4730 Sleep apnea, unspecified: Secondary | ICD-10-CM | POA: Diagnosis not present

## 2020-11-08 DIAGNOSIS — E869 Volume depletion, unspecified: Secondary | ICD-10-CM | POA: Diagnosis not present

## 2020-11-08 DIAGNOSIS — R945 Abnormal results of liver function studies: Secondary | ICD-10-CM | POA: Diagnosis not present

## 2020-11-08 DIAGNOSIS — Z7401 Bed confinement status: Secondary | ICD-10-CM | POA: Diagnosis not present

## 2020-11-08 DIAGNOSIS — R7401 Elevation of levels of liver transaminase levels: Secondary | ICD-10-CM | POA: Diagnosis not present

## 2020-11-08 DIAGNOSIS — F1010 Alcohol abuse, uncomplicated: Secondary | ICD-10-CM | POA: Diagnosis present

## 2020-11-08 DIAGNOSIS — D72829 Elevated white blood cell count, unspecified: Secondary | ICD-10-CM

## 2020-11-08 DIAGNOSIS — K660 Peritoneal adhesions (postprocedural) (postinfection): Secondary | ICD-10-CM | POA: Diagnosis not present

## 2020-11-08 DIAGNOSIS — N400 Enlarged prostate without lower urinary tract symptoms: Secondary | ICD-10-CM | POA: Diagnosis present

## 2020-11-08 DIAGNOSIS — R059 Cough, unspecified: Secondary | ICD-10-CM

## 2020-11-08 DIAGNOSIS — R0902 Hypoxemia: Secondary | ICD-10-CM | POA: Diagnosis not present

## 2020-11-08 DIAGNOSIS — R29818 Other symptoms and signs involving the nervous system: Secondary | ICD-10-CM | POA: Diagnosis not present

## 2020-11-08 DIAGNOSIS — Z87891 Personal history of nicotine dependence: Secondary | ICD-10-CM

## 2020-11-08 DIAGNOSIS — R6520 Severe sepsis without septic shock: Secondary | ICD-10-CM

## 2020-11-08 DIAGNOSIS — G319 Degenerative disease of nervous system, unspecified: Secondary | ICD-10-CM | POA: Diagnosis not present

## 2020-11-08 DIAGNOSIS — I739 Peripheral vascular disease, unspecified: Secondary | ICD-10-CM | POA: Diagnosis not present

## 2020-11-08 DIAGNOSIS — I517 Cardiomegaly: Secondary | ICD-10-CM | POA: Diagnosis not present

## 2020-11-08 LAB — URINALYSIS, COMPLETE (UACMP) WITH MICROSCOPIC
Bacteria, UA: NONE SEEN
Bilirubin Urine: NEGATIVE
Glucose, UA: NEGATIVE mg/dL
Hgb urine dipstick: NEGATIVE
Ketones, ur: NEGATIVE mg/dL
Nitrite: NEGATIVE
Protein, ur: NEGATIVE mg/dL
Specific Gravity, Urine: 1.029 (ref 1.005–1.030)
pH: 6 (ref 5.0–8.0)

## 2020-11-08 LAB — CBC WITH DIFFERENTIAL/PLATELET
Abs Immature Granulocytes: 0.06 10*3/uL (ref 0.00–0.07)
Basophils Absolute: 0 10*3/uL (ref 0.0–0.1)
Basophils Relative: 0 %
Eosinophils Absolute: 0 10*3/uL (ref 0.0–0.5)
Eosinophils Relative: 0 %
HCT: 43.9 % (ref 39.0–52.0)
Hemoglobin: 15.2 g/dL (ref 13.0–17.0)
Immature Granulocytes: 1 %
Lymphocytes Relative: 3 %
Lymphs Abs: 0.4 10*3/uL — ABNORMAL LOW (ref 0.7–4.0)
MCH: 33.4 pg (ref 26.0–34.0)
MCHC: 34.6 g/dL (ref 30.0–36.0)
MCV: 96.5 fL (ref 80.0–100.0)
Monocytes Absolute: 0.8 10*3/uL (ref 0.1–1.0)
Monocytes Relative: 7 %
Neutro Abs: 11.3 10*3/uL — ABNORMAL HIGH (ref 1.7–7.7)
Neutrophils Relative %: 89 %
Platelets: 171 10*3/uL (ref 150–400)
RBC: 4.55 MIL/uL (ref 4.22–5.81)
RDW: 12.5 % (ref 11.5–15.5)
WBC: 12.6 10*3/uL — ABNORMAL HIGH (ref 4.0–10.5)
nRBC: 0 % (ref 0.0–0.2)

## 2020-11-08 LAB — ACETAMINOPHEN LEVEL: Acetaminophen (Tylenol), Serum: <10 ug/mL — ABNORMAL LOW (ref 10–30)

## 2020-11-08 LAB — RESP PANEL BY RT-PCR (FLU A&B, COVID) ARPGX2
Influenza A by PCR: NEGATIVE
Influenza B by PCR: NEGATIVE
SARS Coronavirus 2 by RT PCR: NEGATIVE

## 2020-11-08 LAB — URINE DRUG SCREEN, QUALITATIVE (ARMC ONLY)
Amphetamines, Ur Screen: NOT DETECTED
Barbiturates, Ur Screen: NOT DETECTED
Benzodiazepine, Ur Scrn: NOT DETECTED
Cannabinoid 50 Ng, Ur ~~LOC~~: NOT DETECTED
Cocaine Metabolite,Ur ~~LOC~~: NOT DETECTED
MDMA (Ecstasy)Ur Screen: NOT DETECTED
Methadone Scn, Ur: NOT DETECTED
Opiate, Ur Screen: NOT DETECTED
Phencyclidine (PCP) Ur S: NOT DETECTED
Tricyclic, Ur Screen: NOT DETECTED

## 2020-11-08 LAB — D-DIMER, QUANTITATIVE: D-Dimer, Quant: 3.47 ug/mL-FEU — ABNORMAL HIGH (ref 0.00–0.50)

## 2020-11-08 LAB — COMPREHENSIVE METABOLIC PANEL
ALT: 476 U/L — ABNORMAL HIGH (ref 0–44)
AST: 784 U/L — ABNORMAL HIGH (ref 15–41)
Albumin: 3.2 g/dL — ABNORMAL LOW (ref 3.5–5.0)
Alkaline Phosphatase: 132 U/L — ABNORMAL HIGH (ref 38–126)
Anion gap: 8 (ref 5–15)
BUN: 14 mg/dL (ref 8–23)
CO2: 27 mmol/L (ref 22–32)
Calcium: 8.9 mg/dL (ref 8.9–10.3)
Chloride: 102 mmol/L (ref 98–111)
Creatinine, Ser: 1.04 mg/dL (ref 0.61–1.24)
GFR, Estimated: >60 mL/min (ref >60)
Glucose, Bld: 128 mg/dL — ABNORMAL HIGH (ref 70–99)
Potassium: 3.5 mmol/L (ref 3.5–5.1)
Sodium: 137 mmol/L (ref 135–145)
Total Bilirubin: 4.2 mg/dL — ABNORMAL HIGH (ref 0.3–1.2)
Total Protein: 6.4 g/dL — ABNORMAL LOW (ref 6.5–8.1)

## 2020-11-08 LAB — PROTIME-INR
INR: 1.2 (ref 0.8–1.2)
Prothrombin Time: 14.7 seconds (ref 11.4–15.2)

## 2020-11-08 LAB — MAGNESIUM: Magnesium: 1.8 mg/dL (ref 1.7–2.4)

## 2020-11-08 LAB — BRAIN NATRIURETIC PEPTIDE: B Natriuretic Peptide: 196.9 pg/mL — ABNORMAL HIGH (ref 0.0–100.0)

## 2020-11-08 LAB — PROCALCITONIN: Procalcitonin: 0.88 ng/mL

## 2020-11-08 LAB — TSH: TSH: 2.882 u[IU]/mL (ref 0.350–4.500)

## 2020-11-08 LAB — LACTIC ACID, PLASMA: Lactic Acid, Venous: 1.8 mmol/L (ref 0.5–1.9)

## 2020-11-08 LAB — APTT: aPTT: 27 seconds (ref 24–36)

## 2020-11-08 LAB — LIPASE, BLOOD: Lipase: 29 U/L (ref 11–51)

## 2020-11-08 LAB — TROPONIN I (HIGH SENSITIVITY): Troponin I (High Sensitivity): 16 ng/L (ref <18)

## 2020-11-08 MED ORDER — IOHEXOL 350 MG/ML SOLN
100.0000 mL | Freq: Once | INTRAVENOUS | Status: AC | PRN
  Administered 2020-11-08: 100 mL via INTRAVENOUS

## 2020-11-08 MED ORDER — ACETAMINOPHEN 650 MG RE SUPP: 650.0000 mg | Freq: Four times a day (QID) | RECTAL | Status: DC | PRN

## 2020-11-08 MED ORDER — PIPERACILLIN-TAZOBACTAM 3.375 G IVPB 30 MIN
3.3750 g | Freq: Once | INTRAVENOUS | Status: AC
  Administered 2020-11-08: 3.375 g via INTRAVENOUS
  Filled 2020-11-08: qty 50

## 2020-11-08 MED ORDER — PIPERACILLIN-TAZOBACTAM 3.375 G IVPB
3.3750 g | Freq: Three times a day (TID) | INTRAVENOUS | Status: AC
  Administered 2020-11-09 – 2020-11-11 (×7): 3.375 g via INTRAVENOUS
  Filled 2020-11-08 (×7): qty 50

## 2020-11-08 MED ORDER — ONDANSETRON HCL 4 MG/2ML IJ SOLN: 4.0000 mg | Freq: Four times a day (QID) | INTRAMUSCULAR | Status: DC | PRN

## 2020-11-08 MED ORDER — ACETAMINOPHEN 325 MG PO TABS: 650.0000 mg | ORAL_TABLET | Freq: Four times a day (QID) | ORAL | Status: DC | PRN

## 2020-11-08 MED ORDER — GADOBUTROL 1 MMOL/ML IV SOLN
10.0000 mL | Freq: Once | INTRAVENOUS | Status: AC | PRN
  Administered 2020-11-08: 10 mL via INTRAVENOUS

## 2020-11-08 MED ORDER — LACTATED RINGERS IV SOLN: INTRAVENOUS | Status: DC

## 2020-11-08 MED ORDER — THIAMINE HCL 100 MG/ML IJ SOLN
Freq: Once | INTRAVENOUS | Status: AC
  Filled 2020-11-08: qty 1000

## 2020-11-08 NOTE — Progress Notes (Signed)
Brief note regarding plan, with full H&P to follow:  86 year old male who is admitted with severe sepsis after presenting with nausea/vomiting and generalized weakness, with presenting labs reflecting acute transaminitis, elevated total bilirubin, but minimal elevation of alkaline phosphatase without overt evidence of acute cholecystitis, common bile duct dilation, or choledocholithiasis on imaging.  SIRS criteria met via presenting leukocytosis and tachycardia.  Case was discussed with the on-call gastroenterologist, Dr. Haig Prophet, who feels like cholestatic process is less likely in the absence of significant alkaline phosphatase elevation, and consequently feels that no cholestatic hepatitis is more likely source for these findings.  Recommends MRCP, with GI to follow and additional recs pending at this time.  Started on Zosyn.  Check GGT and direct bilirubin.  Repeat CMP in the morning.  As needed Zofran.  COVID-negative.    Babs Bertin, DO Hospitalist

## 2020-11-08 NOTE — ED Provider Notes (Signed)
[facility] Emergency Department Provider Note  ____________________________________________   Event Date/Time   First MD Initiated Contact with Patient 11/08/20 1804     (approximate)  I have reviewed the triage vital signs and the nursing notes.   HISTORY  Chief Complaint Weakness (Pt unable to get up from toilet , sat to ground , no thinners, no fall per pt general weakness )   HPI [name] is a 86 y.o. male with a past history of angiodysplasia, thyroiditis, HTN, obesity, peripheral neuropathy, PAD, arthritis, ocular degeneration and some memory loss who presents via EMS for assessment of generalized weakness after patient was reportedly unable to get up off the toilet today.  Patient states he felt like he may have had some food poisoning last night and had some vomiting last night but felt otherwise okay this morning until he felt too weak to get up off the toilet.  He does not recall any recent diarrhea, abdominal pain, back pain, chest pain, cough, shortness of breath, headache urine, sore throat, fevers or recent falls or injuries.  He is oriented to year but not month.  He does not recall what medicines he is on.         Past Medical History:  Diagnosis Date   Angiodysplasia 02/13/2006   Arthritis    knees   Benign prostatic hypertrophy    Colon polyps    Diverticulosis of colon    Hypertension    Macular degeneration, wet ([place])    Dr Dwain Sarna   Obesity    Peripheral neuropathy    feet   Sleep apnea    DX'd when younger. declined CPAP.    Patient Active Problem List   Diagnosis Date Noted   Severe sepsis ([place]) 11/08/2020   PAD (peripheral artery disease) ([place]) 10/08/2020   Contusion of left hand 07/16/2020   Mood disorder ([place]) 04/06/2020   Osteoarthritis of right knee 04/01/2019   Sensorineural hearing loss (SNHL), bilateral 10/01/2018   Impacted cerumen of both ears 10/01/2018   Memory loss 09/11/2018   Hearing  loss 09/11/2018   Urinary incontinence 08/03/2018   Arrhythmia 09/14/2016   Impaired fasting glucose 08/26/2014   Advance directive discussed with patient 08/26/2014   Peripheral neuropathy    Routine general medical examination at a health care facility 08/16/2011   Macular degeneration, wet ([place])    Obesity, Class III, BMI 40-49.9 (morbid obesity) ([place]) 08/03/2010   BPH with obstruction/lower urinary tract symptoms 12/24/2007   Essential hypertension, benign 12/13/2006   DIVERTICULOSIS, COLON 12/13/2006    Past Surgical History:  Procedure Laterality Date   APPENDECTOMY     CATARACT EXTRACTION W/PHACO Left 03/05/2018   Procedure: CATARACT EXTRACTION PHACO AND INTRAOCULAR LENS PLACEMENT ([place]) LEFT;  Surgeon: Eulogio Bear, MD;  Location: [place];  Service: Ophthalmology;  Laterality: Left;  sleep apnea   CATARACT EXTRACTION W/PHACO Right 03/19/2018   Procedure: CATARACT EXTRACTION PHACO AND INTRAOCULAR LENS PLACEMENT ([facility]);  Surgeon: Eulogio Bear, MD;  Location: [place];  Service: Ophthalmology;  Laterality: Right;   IR GUIDED DRAIN W CATHETER PLACEMENT  07/03/2018   IR RADIOLOGIST EVAL & MGMT  07/18/2018   KNEE ARTHROSCOPY     BOTH KNEES   MASTOID DEBRIDEMENT     PILONIDAL CYST EXCISION  1960   TONSILLECTOMY      Prior to Admission medications   Medication Sig Start Date End Date Taking? Authorizing Provider  multivitamin-lutein [facility]) CAPS capsule Take 1 capsule  by mouth daily.    [provider]  potassium chloride (K-DUR) 10 MEQ tablet Take 1 tablet (10 mEq total) by mouth daily. 09/13/18   Venia Carbon, MD  triamterene-hydrochlorothiazide (DYAZIDE) 37.5-25 MG capsule Take 1 each (1 capsule total) by mouth daily. 09/11/18   Venia Carbon, MD    Allergies Ampicillin  Family History  Problem Relation Age of Onset   Cancer Mother    Obesity Sister    Diabetes Sister    Diabetes Maternal Uncle     Social  History Social History   Tobacco Use   Smoking status: Former    Pack years: 0.00    Types: Cigarettes    Quit date: 05/17/1983    Years since quitting: 37.5   Smokeless tobacco: Never  Vaping Use   Vaping Use: Never used  Substance Use Topics   Alcohol use: Yes    Alcohol/week: 14.0 standard drinks    Types: 14 Shots of liquor per week    Comment: Drinks 2 cocktails /day   Drug use: No    Review of Systems  Review of Systems  Constitutional:  Negative for chills and fever.  HENT:  Negative for sore throat.   Eyes:  Negative for pain.  Respiratory:  Negative for cough and stridor.   Cardiovascular:  Negative for chest pain.  Gastrointestinal:  Negative for vomiting.  Genitourinary:  Negative for dysuria.  Musculoskeletal:  Negative for myalgias.  Skin:  Negative for rash.  Neurological:  Positive for weakness. Negative for seizures, loss of consciousness and headaches.  Psychiatric/Behavioral:  Negative for suicidal ideas.   All other systems reviewed and are negative.    ____________________________________________   PHYSICAL EXAM:  VITAL SIGNS: ED Triage Vitals  Enc Vitals Group     BP --      Pulse Rate 11/08/20 1803 85     Resp 11/08/20 1803 17     Temp 11/08/20 1803 98.5 F (36.9 C)     Temp Source 11/08/20 1803 Oral     SpO2 11/08/20 1803 98 %     Weight 11/08/20 1804 275 lb 9.2 oz (125 kg)     Height 11/08/20 1804 '5\' 10"'  (1.778 m)     Head Circumference --      Peak Flow --      Pain Score 11/08/20 1804 0     Pain Loc --      Pain Edu? --      Excl. in [place]? --    Vitals:   11/08/20 1838 11/08/20 2003  BP:  (!) 153/80  Pulse: (!) 120 (!) 109  Resp:  (!) 36  Temp:    SpO2:  95%   Physical Exam Vitals and nursing note reviewed.  Constitutional:      General: He is in acute distress.     Appearance: He is well-developed. He is obese. He is ill-appearing.  HENT:     Head: Normocephalic and atraumatic.     Right Ear: External ear normal.      Left Ear: External ear normal.     Nose: Nose normal.     Mouth/Throat:     Mouth: Mucous membranes are dry.  Eyes:     Conjunctiva/sclera: Conjunctivae normal.  Cardiovascular:     Rate and Rhythm: Regular rhythm. Tachycardia present.     Heart sounds: No murmur heard. Pulmonary:     Effort: Pulmonary effort is normal. No respiratory distress.     Breath sounds: Normal  breath sounds.  Abdominal:     Palpations: Abdomen is soft.     Tenderness: There is abdominal tenderness in the right upper quadrant and epigastric area.  Musculoskeletal:     Cervical back: Neck supple.     Right lower leg: Edema present.     Left lower leg: Edema present.  Skin:    General: Skin is warm and dry.     Capillary Refill: Capillary refill takes 2 to 3 seconds.  Neurological:     Mental Status: He is alert. He is disoriented.  Psychiatric:        Mood and Affect: Mood normal.     ____________________________________________   LABS (all labs ordered are listed, but only abnormal results are displayed)  Labs Reviewed  CBC WITH DIFFERENTIAL/PLATELET - Abnormal; Notable for the following components:      Result Value   WBC 12.6 (*)    Neutro Abs 11.3 (*)    Lymphs Abs 0.4 (*)    All other components within normal limits  COMPREHENSIVE METABOLIC PANEL - Abnormal; Notable for the following components:   Glucose, Bld 128 (*)    Total Protein 6.4 (*)    Albumin 3.2 (*)    AST 784 (*)    ALT 476 (*)    Alkaline Phosphatase 132 (*)    Total Bilirubin 4.2 (*)    All other components within normal limits  D-DIMER, QUANTITATIVE - Abnormal; Notable for the following components:   D-Dimer, Quant 3.47 (*)    All other components within normal limits  LACTIC ACID, PLASMA - Abnormal; Notable for the following components:   Lactic Acid, Venous 2.3 (*)    All other components within normal limits  ACETAMINOPHEN LEVEL - Abnormal; Notable for the following components:   Acetaminophen (Tylenol), Serum  <10 (*)    All other components within normal limits  BRAIN NATRIURETIC PEPTIDE - Abnormal; Notable for the following components:   B Natriuretic Peptide 196.9 (*)    All other components within normal limits  RESP PANEL BY RT-PCR (FLU A&B, COVID) ARPGX2  CULTURE, BLOOD (SINGLE)  URINE CULTURE  PROCALCITONIN  TSH  MAGNESIUM  LIPASE, BLOOD  URINALYSIS, COMPLETE (UACMP) WITH MICROSCOPIC  LACTIC ACID, PLASMA  PROTIME-INR  APTT  HEPATITIS PANEL, ACUTE  MAGNESIUM  MAGNESIUM  PHOSPHORUS  COMPREHENSIVE METABOLIC PANEL  CBC WITH DIFFERENTIAL/PLATELET  GAMMA GT  BILIRUBIN, DIRECT  TROPONIN I (HIGH SENSITIVITY)   ____________________________________________  EKG  Sinus tachycardia with a ventricular of 110, ventricular premature complex, incomplete right bundle branch block and left anterior fascicle block with multiple nonspecific changes in inferior anterior and lateral leads.  Otherwise unremarkable intervals. ____________________________________________  RADIOLOGY  ED MD interpretation: Chest x-ray shows no evidence of focal consolidation, overt edema, pneumothorax, large effusion or other clear acute intrathoracic process.  CT head without any clear acute intracranial process including evidence of stroke or SAH.  CTA chest remarkable for evidence of CAD and cardiomegaly without overt edema, large effusion or focal consolidation.  There is evidence of an ascending aortic aneurysm without dissection or rupture.  CT abdomen pelvis shows no evidence of kidney stone, cholecystitis, dilated common bile duct, pancreatitis, diverticulitis or other clear acute abdominopelvic process.  Right upper quadrant ultrasound remarkable for evidence of cholelithiasis without evidence of acute cholecystitis and normal caliber of common bile duct.  Official radiology report(s): CT Head Wo Contrast  Result Date: 11/08/2020 CLINICAL DATA:  Neuro deficit, acute, stroke suspected EXAM: CT HEAD WITHOUT  CONTRAST TECHNIQUE:  Contiguous axial images were obtained from the base of the skull through the vertex without intravenous contrast. COMPARISON:  None. FINDINGS: Brain: There is atrophy and chronic small vessel disease changes. No acute intracranial abnormality. Specifically, no hemorrhage, hydrocephalus, mass lesion, acute infarction, or significant intracranial injury. Vascular: No hyperdense vessel or unexpected calcification. Skull: No acute calvarial abnormality. Sinuses/Orbits: Visualized paranasal sinuses and mastoids clear. Orbital soft tissues unremarkable. Other: None IMPRESSION: Atrophy, chronic microvascular disease. No acute intracranial abnormality. Electronically Signed   By: Rolm Baptise M.D.   On: 11/08/2020 19:50   CT Angio Chest PE W and/or Wo Contrast  Result Date: 11/08/2020 CLINICAL DATA:  PE suspected, high probability EXAM: CT ANGIOGRAPHY CHEST WITH CONTRAST TECHNIQUE: Multidetector CT imaging of the chest was performed using the standard protocol during bolus administration of intravenous contrast. Multiplanar CT image reconstructions and MIPs were obtained to evaluate the vascular anatomy. CONTRAST:  158m OMNIPAQUE IOHEXOL 350 MG/ML SOLN COMPARISON:  None. FINDINGS: Cardiovascular: No filling defects in the pulmonary arteries to suggest pulmonary emboli. Mild cardiomegaly. Moderate coronary artery and aortic calcifications. Mild aneurysmal dilatation aorta, 4.2 cm. Of the ascending thoracic Mediastinum/Nodes: No mediastinal, hilar, or axillary adenopathy. Trachea and esophagus are unremarkable. Thyroid unremarkable. Lungs/Pleura: Lungs are clear. No focal airspace opacities or suspicious nodules. No effusions. Upper Abdomen: Imaging into the upper abdomen demonstrates no acute findings. Musculoskeletal: 2 Chest wall soft tissues are unremarkable. No acute bony abnormality. Review of the MIP images confirms the above findings. IMPRESSION: No evidence of pulmonary embolus. Cardiomegaly,  coronary artery disease. 4.2 cm ascending thoracic aortic aneurysm. Recommend annual imaging followup by CTA or MRA. This recommendation follows 2010 ACCF/AHA/AATS/ACR/ASA/SCA/SCAI/SIR/STS/SVM Guidelines for the Diagnosis and Management of Patients with Thoracic Aortic Disease. Circulation. 2010; 121:: B284-X324 Aortic aneurysm NOS (ICD10-I71.9) Aortic Atherosclerosis (ICD10-I70.0). Electronically Signed   By: KRolm BaptiseM.D.   On: 11/08/2020 19:58   CT ABDOMEN PELVIS W CONTRAST  Result Date: 11/08/2020 CLINICAL DATA:  Abdominal pain EXAM: CT ABDOMEN AND PELVIS WITH CONTRAST TECHNIQUE: Multidetector CT imaging of the abdomen and pelvis was performed using the standard protocol following bolus administration of intravenous contrast. CONTRAST:  1013mOMNIPAQUE IOHEXOL 350 MG/ML SOLN COMPARISON:  None. FINDINGS: Lower chest: No acute abnormality.  Aortic atherosclerosis. Hepatobiliary: No focal hepatic abnormality. Gallbladder unremarkable. Pancreas: No focal abnormality or ductal dilatation. Spleen: No focal abnormality.  Normal size. Adrenals/Urinary Tract: No adrenal abnormality. No focal renal abnormality. No stones or hydronephrosis. Urinary bladder is unremarkable. Stomach/Bowel: Stomach, large and small bowel grossly unremarkable. Vascular/Lymphatic: Aortic atherosclerosis. No evidence of aneurysm or adenopathy. Reproductive: Mild prostate enlargement with calcifications. Other: No free fluid or free air. Musculoskeletal: No acute bony abnormality. IMPRESSION: No acute findings in the abdomen or pelvis. Aortic atherosclerosis. Prostate enlargement. Electronically Signed   By: KeRolm Baptise.D.   On: 11/08/2020 20:01   DG Chest Portable 1 View  Result Date: 11/08/2020 CLINICAL DATA:  Weakness. EXAM: PORTABLE CHEST 1 VIEW COMPARISON:  July 02, 2018 FINDINGS: Stable cardiomegaly. The hila and mediastinum are unchanged. No pneumothorax. No nodules or masses. No focal infiltrates. No overt edema.  IMPRESSION: No active disease. Electronically Signed   By: DaDorise BullionII M.D   On: 11/08/2020 18:54   [place]BDOMEN LIMITED RUQ (LIVER/GB)  Result Date: 11/08/2020 CLINICAL DATA:  Transaminitis EXAM: ULTRASOUND ABDOMEN LIMITED RIGHT UPPER QUADRANT COMPARISON:  None. FINDINGS: Gallbladder: Gallbladder is contracted with several stones measuring up to 1.5 cm. Gallbladder wall mildly thickened at 4 mm. Common bile duct:  Diameter: Normal caliber, 5 mm Liver: No focal lesion identified. Within normal limits in parenchymal echogenicity. Portal vein is patent on color Doppler imaging with normal direction of blood flow towards the liver. Other: None. IMPRESSION: Cholelithiasis.  No sonographic evidence of acute cholecystitis. Electronically Signed   By: Rolm Baptise M.D.   On: 11/08/2020 20:04    ____________________________________________   PROCEDURES  Procedure(s) performed (including Critical Care):  .Critical Care  Date/Time: 11/08/2020 9:36 PM Performed by: Lucrezia Starch, MD Authorized by: Lucrezia Starch, MD   Critical care provider statement:    Critical care time (minutes):  45   Critical care was necessary to treat or prevent imminent or life-threatening deterioration of the following conditions:  Sepsis   Critical care was time spent personally by me on the following activities:  Discussions with consultants, evaluation of patient's response to treatment, examination of patient, ordering and performing treatments and interventions, ordering and review of laboratory studies, ordering and review of radiographic studies, pulse oximetry, re-evaluation of patient's condition, obtaining history from patient or surrogate and review of old charts   ____________________________________________   INITIAL IMPRESSION / [place] / ED COURSE     Patient presents with above-stated history exam for assessment of some generalized weakness.  History is very limited on arrival as  patient is not oriented to date and cannot provide his complete medical history although states he just feels weak and had some vomiting last night.  I did attempt to obtain some additional history by calling patient's son x2 which went to voicemail as well as patient's nursing facility and this person also went to voicemail x2.  On arrival patient's is noted to be tachycardic in the 110s, tachypneic in the low 20s and borderline hypoxic with SPO2 ranging between 90 and 93% on room air.  He is hypertensive with BP of 149/74.  He has a nonfocal supine neuro exam although is not oriented to month or date or recent medical history and unclear if this is baseline or not.  Initial differential is quite broad and includes PE, ACS, metabolic derangements, acute infectious process, acute cholecystitis, endocrine derangements, arrhythmia, anemia, heart failure and CVA.  Chest x-ray shows no evidence of focal consolidation, overt edema, pneumothorax, large effusion or other clear acute intrathoracic process.  Troponin is nonelevated at 16 specific changes on ECG low suspicion for ACS at this time.  TSH and magnesium are unremarkable.  D-dimer is elevated 3.47.  CBC with WBC count 12.6 with unremarkable hemoglobin.  CMP remarkable for an AST of 784, ALT of 476 and a T bili of 4.2 and alk phos of 132.  This is compared to most recent CMP done months ago with normal LFTs and bilirubin.  This concerning for possible choledocholithiasis and/or cholangitis.  D-dimer is elevated 3.47.  Lipase is 29 not consistent with acute pancreatitis.  Tylenol was undetectable.  BMP is 196 and given some edema on exam concern for possible undiagnosed mild CHF.  We will gently hydrate given concern for sepsis and slightly elevated lactic acid at 2.3 with no evidence of hypotension.  Given unclear baseline mental status with patient somewhat confused on arrival and concern for possible PE, pneumonia, and acute abdominal process CT head,  CTA chest and CT abdomen pelvis as well as right upper quadrant ultrasound obtained.  CT head without any clear acute intracranial process including evidence of stroke or SAH.  CTA chest remarkable for evidence of CAD and cardiomegaly without overt edema,  large effusion or focal consolidation.  There is evidence of an ascending aortic aneurysm without dissection or rupture.  CT abdomen pelvis shows no evidence of kidney stone, cholecystitis, dilated common bile duct, pancreatitis, diverticulitis or other clear acute abdominopelvic process.  Right upper quadrant ultrasound remarkable for evidence of cholelithiasis without evidence of acute cholecystitis and normal caliber of common bile duct.  Discussed presentation and work-up with on-call gastroenterologist Dr. Haig Prophet recommended obtaining MRCP and advised that Dr. Allen Norris would be available if needed tomorrow for possible ERCP if needed.  He advised that he thought CMP was most consistent with likely hepatitis.  We will cover with Zosyn and obtain blood cultures with concern for possible cholangitis.  ____________________________________________   FINAL CLINICAL IMPRESSION(S) / ED DIAGNOSES  Final diagnoses:  Transaminitis  Sepsis, due to unspecified organism, unspecified whether acute organ dysfunction present (HCC)  Lactic acid acidosis  High bilirubin  Leukocytosis, unspecified type    Medications  lactated ringers infusion (has no administration in time range)  acetaminophen (TYLENOL) tablet 650 mg (has no administration in time range)    Or  acetaminophen (TYLENOL) suppository 650 mg (has no administration in time range)  ondansetron (ZOFRAN) injection 4 mg (has no administration in time range)  piperacillin-tazobactam (ZOSYN) IVPB 3.375 g (0 g Intravenous Stopped 11/08/20 2032)  iohexol (OMNIPAQUE) 350 MG/ML injection 100 mL (100 mLs Intravenous Contrast Given 11/08/20 1933)     ED Discharge Orders     None        Note:   This document was prepared using Dragon voice recognition software and may include unintentional dictation errors.    Lucrezia Starch, MD 11/08/20 2136

## 2020-11-08 NOTE — Consult Note (Signed)
Pharmacy Antibiotic Note  Brad Reed is a 86 y.o. male admitted on 11/08/2020 withnausea/vomiting and generalized weakness/ Sepsis. Concern for intra-abdominal infection. Pharmacy has been consulted for Zosyn dosing.  Plan: Zosyn 3.375g IV q8h (4 hour infusion).  Height: 5\' 10"  (177.8 cm) Weight: 125 kg (275 lb 9.2 oz) IBW/kg (Calculated) : 73  Temp (24hrs), Avg:98.5 F (36.9 C), Min:98.5 F (36.9 C), Max:98.5 F (36.9 C)  Recent Labs  Lab 11/08/20 1810 11/08/20 1848  WBC 12.6*  --   CREATININE 1.04  --   LATICACIDVEN  --  2.3*    Estimated Creatinine Clearance: 70.1 mL/min (by C-G formula based on SCr of 1.04 mg/dL).    Allergies  Allergen Reactions   Ampicillin Hives    Antimicrobials this admission: zosyn 6/26 >>    Dose adjustments this admission: N/a  Microbiology results: 6/26 BCx: sent 6/16 UCx: sent    Thank you for allowing pharmacy to be a part of this patient's care.  7/16, PharmD, BCPS Clinical Pharmacist   11/08/2020 9:38 PM

## 2020-11-08 NOTE — Progress Notes (Signed)
Pt being followed by [facility] for Sepsis protocol. 

## 2020-11-08 NOTE — ED Notes (Signed)
Patient in radiology

## 2020-11-08 NOTE — ED Triage Notes (Signed)
Pt unable to get up from toilet , sat to ground , no thinners, no fall per pt general weakness

## 2020-11-08 NOTE — H&P (Signed)
History and Physical    PLEASE NOTE THAT DRAGON DICTATION SOFTWARE WAS USED IN THE CONSTRUCTION OF THIS NOTE.   HARSHAL SIRMON YWV:[id number] DOB: Feb 01, 1935 DOA: 11/08/2020  PCP: Venia Carbon, MD Patient coming from: home   I have personally briefly reviewed patient's old medical records in [place]  Chief Complaint: Nausea vomiting  HPI: PAITON FOSCO is a 86 y.o. male with medical history significant for essential hypertension, diverticulosis, who is admitted to [facility] on 11/08/2020 with severe sepsis in the setting of acute transaminitis after presenting from home to [facility] ED complaining of nausea/vomiting.   The patient reports 1 to 2 days of nausea resulting in a total of 4-5 episodes of nonbloody, nonbilious emesis over that timeframe, with most recent such episode of emesis occurring just prior to presenting to [facility] ED today.  He denies any associated abdominal pain, diarrhea, melena, or hematochezia.  Denies any associated subjective fever, chills, rigors, or generalized myalgias. Denies any recent headache, neck stiffness, rhinitis, rhinorrhea, sore throat, sob, wheezing, cough, or rash. No recent traveling or known COVID-19 exposures. Denies dysuria, gross hematuria, or change in urinary urgency/frequency.  Denies any recent chest pain, diaphoresis, palpitations, hemoptysis, lower extremity erythema, calf tenderness, dizziness, presyncope, or syncope.  He reiterates to me, that aside from intermittent nausea/vomiting over the last few days as well as some generalized weakness over that time, that he is been otherwise completely asymptomatic over that timeframe.  He notes generalized weakness over the last 1 to 2 days in the absence of any acute focal weakness, numbness, paresthesias, slurred speech, facial droop, acute change in vision, dysphagia, or vertigo.  No recent trauma.  The patient reports that he was hospitalized approximately 1 year ago  for an "infection in my liver, that took a long time to resolve", but is unsure as to additional details relating to this infection or hospitalization.  No recent travel or known sick contacts.  He reports that he typically consumes 2 shots of liquor per day, without any recent increase from this typical frequency/volume of consumption, with most recent alcohol consumed on 11/07/2020.  Denies any history of alcohol withdrawal.  Denies any use of recreational drugs.     ED Course:  Vital signs in the ED were notable for the following:  -Tetramex 98.5, heart rate 85-1 20, with most recent heart rate noted to be 109; blood pressure 149/74 153/80; respiratory rate 17-26; oxygen saturation 95 to 98% on room air.  Labs were notable for the following: CMP was notable for the following: Sodium 137, bicarbonate 27, anion gap 8, creatinine 1.04, glucose 128.  The following are liver enzymes performed in the ED today, with comparison to most recent prior liver enzymes that were checked in November 2021: Presenting albumin 3.2, alkaline phosphatase 132 relative to most recent prior of 58, AST 784, relative to 18, ALT 476, relative to 13, total bilirubin 4.2 compared to most recent prior of 1.2.  Lipase 29.  Serum acetaminophen level less than 10.  High-sensitivity troponin I 16.  Initial lactate 2.3, with repeat value trending down to 1.8 following interval administration of IV fluids, as further detailed below.  Procalcitonin 0.88.  CBC notable for white blood cell count of 12,600 with 89% neutrophils, hemoglobin 15.  D-dimer 3.47.  Urinalysis has been ordered, with result currently pending.  TSH is 2.882.  Nasopharyngeal COVID-19/influenza PCR were checked in the ED today and found to be negative.  Blood cultures times  were checked prior to initiation of IV antibiotics.  EKG shows sinus tachycardia with single PVC and ventricular rate 110, incomplete right bundle branch block and left anterior fascicular block, and  no evidence of T wave or ST changes, including no evidence of ST elevation.  Chest x-ray shows no evidence of acute cardiopulmonary process, CT head shows no evidence of acute intracranial process.  CTA chest shows no acute pulmonary embolism, infiltrate, edema, pleural effusion, or pneumothorax.  CT abdomen/pelvis with contrast shows no evidence of acute intra-abdominal process, including no evidence of gallbladder wall thickening, pericholecystic fluid, common bile duct dilation, choledocholithiasis, or evidence of acute pancreatitis.  Right upper quadrant ultrasound shows cholelithiasis measuring up to 1.5 cm, mild thickening of gallbladder wall, no evidence of pericholecystic fluid, no evidence of common bile duct dilation (diameter noted to be 5 mm), and no overt choledocholithiasis.  Case was discussed with the on-call gastroenterologist, Dr. Haig Prophet, who feels like cholestatic process is less likely in the absence of significant alkaline phosphatase elevation, and consequently feels that non-cholestatic hepatitis is more likely source for these findings.  Recommends MRCP for further evaluation, with GI to follow and additional associated recs pending at this time.  While in the ED, the following were administered: Zosyn x1 dose, lactated Ringer's at 125 cc/h.    Review of Systems: As per HPI otherwise 10 point review of systems negative.   Past Medical History:  Diagnosis Date   Angiodysplasia 02/13/2006   Arthritis    knees   Benign prostatic hypertrophy    Colon polyps    Diverticulosis of colon    Hypertension    Macular degeneration, wet ([place])    Dr Dwain Sarna   Obesity    Peripheral neuropathy    feet   Sleep apnea    DX'd when younger. declined CPAP.    Past Surgical History:  Procedure Laterality Date   APPENDECTOMY     CATARACT EXTRACTION W/PHACO Left 03/05/2018   Procedure: CATARACT EXTRACTION PHACO AND INTRAOCULAR LENS PLACEMENT ([place]) LEFT;  Surgeon: Eulogio Bear, MD;  Location: [place];  Service: Ophthalmology;  Laterality: Left;  sleep apnea   CATARACT EXTRACTION W/PHACO Right 03/19/2018   Procedure: CATARACT EXTRACTION PHACO AND INTRAOCULAR LENS PLACEMENT ([facility]);  Surgeon: Eulogio Bear, MD;  Location: [place];  Service: Ophthalmology;  Laterality: Right;   IR GUIDED DRAIN W CATHETER PLACEMENT  07/03/2018   IR RADIOLOGIST EVAL & MGMT  07/18/2018   KNEE ARTHROSCOPY     BOTH KNEES   MASTOID DEBRIDEMENT     PILONIDAL CYST EXCISION  1960   TONSILLECTOMY      Social History:  reports that he quit smoking about 37 years ago. His smoking use included cigarettes. He has never used smokeless tobacco. He reports current alcohol use of about 14.0 standard drinks of alcohol per week. He reports that he does not use drugs.   Allergies  Allergen Reactions   Ampicillin Hives    Family History  Problem Relation Age of Onset   Cancer Mother    Obesity Sister    Diabetes Sister    Diabetes Maternal Uncle     Family history reviewed and not pertinent    Prior to Admission medications   Medication Sig Start Date End Date Taking? Authorizing Provider  multivitamin-lutein (OCUVITE-LUTEIN) CAPS capsule Take 1 capsule by mouth daily.    [provider]  potassium chloride (K-DUR) 10 MEQ tablet Take 1 tablet (10 mEq total) by mouth  daily. 09/13/18   Venia Carbon, MD  triamterene-hydrochlorothiazide (DYAZIDE) 37.5-25 MG capsule Take 1 each (1 capsule total) by mouth daily. 09/11/18   Venia Carbon, MD     Objective    Physical Exam: Vitals:   11/08/20 1804 11/08/20 1806 11/08/20 1838 11/08/20 2003  BP:  (!) 149/74  (!) 153/80  Pulse:   (!) 120 (!) 109  Resp:    (!) 36  Temp:      TempSrc:      SpO2:    95%  Weight: 125 kg     Height: '5\' 10"'  (1.778 m)       General: appears to be stated age; alert, oriented Skin: warm, dry, no rash Head:  AT/[place] Mouth:  Oral mucosa membranes appear dry,  normal dentition Neck: supple; trachea midline Heart:  RRR; did not appreciate any M/R/G Lungs: CTAB, did not appreciate any wheezes, rales, or rhonchi Abdomen: + BS; soft, ND, NT Vascular: 2+ pedal pulses b/l; 2+ radial pulses b/l Extremities: trace edema in b/l Le's; no muscle wasting Neuro: strength and sensation intact in upper and lower extremities b/l     Labs on Admission: I have personally reviewed following labs and imaging studies  CBC: Recent Labs  Lab 11/08/20 1810  WBC 12.6*  NEUTROABS 11.3*  HGB 15.2  HCT 43.9  MCV 96.5  PLT 979   Basic Metabolic Panel: Recent Labs  Lab 11/08/20 1810  NA 137  K 3.5  CL 102  CO2 27  GLUCOSE 128*  BUN 14  CREATININE 1.04  CALCIUM 8.9  MG 1.8   GFR: Estimated Creatinine Clearance: 70.1 mL/min (by C-G formula based on SCr of 1.04 mg/dL). Liver Function Tests: Recent Labs  Lab 11/08/20 1810  AST 784*  ALT 476*  ALKPHOS 132*  BILITOT 4.2*  PROT 6.4*  ALBUMIN 3.2*   Recent Labs  Lab 11/08/20 1810  LIPASE 29   No results for input(s): AMMONIA in the last 168 hours. Coagulation Profile: No results for input(s): INR, PROTIME in the last 168 hours. Cardiac Enzymes: No results for input(s): CKTOTAL, CKMB, CKMBINDEX, TROPONINI in the last 168 hours. BNP (last 3 results) No results for input(s): PROBNP in the last 8760 hours. HbA1C: No results for input(s): HGBA1C in the last 72 hours. CBG: No results for input(s): GLUCAP in the last 168 hours. Lipid Profile: No results for input(s): CHOL, HDL, LDLCALC, TRIG, CHOLHDL, LDLDIRECT in the last 72 hours. Thyroid Function Tests: Recent Labs    11/08/20 1810  TSH 2.882   Anemia Panel: No results for input(s): VITAMINB12, FOLATE, FERRITIN, TIBC, IRON, RETICCTPCT in the last 72 hours. Urine analysis:    Component Value Date/Time   COLORURINE yellow 08/10/2009 1105   APPEARANCEUR Hazy 08/10/2009 1105   LABSPEC 1.015 08/10/2009 1105   PHURINE 6.0 08/10/2009  1105   HGBUR negative 08/10/2009 1105   BILIRUBINUR negative 08/10/2009 1105   UROBILINOGEN 0.2 08/10/2009 1105   NITRITE negative 08/10/2009 1105    Radiological Exams on Admission: CT Head Wo Contrast  Result Date: 11/08/2020 CLINICAL DATA:  Neuro deficit, acute, stroke suspected EXAM: CT HEAD WITHOUT CONTRAST TECHNIQUE: Contiguous axial images were obtained from the base of the skull through the vertex without intravenous contrast. COMPARISON:  None. FINDINGS: Brain: There is atrophy and chronic small vessel disease changes. No acute intracranial abnormality. Specifically, no hemorrhage, hydrocephalus, mass lesion, acute infarction, or significant intracranial injury. Vascular: No hyperdense vessel or unexpected calcification. Skull: No acute calvarial abnormality. Sinuses/Orbits:  Visualized paranasal sinuses and mastoids clear. Orbital soft tissues unremarkable. Other: None IMPRESSION: Atrophy, chronic microvascular disease. No acute intracranial abnormality. Electronically Signed   By: Rolm Baptise M.D.   On: 11/08/2020 19:50   CT Angio Chest PE W and/or Wo Contrast  Result Date: 11/08/2020 CLINICAL DATA:  PE suspected, high probability EXAM: CT ANGIOGRAPHY CHEST WITH CONTRAST TECHNIQUE: Multidetector CT imaging of the chest was performed using the standard protocol during bolus administration of intravenous contrast. Multiplanar CT image reconstructions and MIPs were obtained to evaluate the vascular anatomy. CONTRAST:  160m OMNIPAQUE IOHEXOL 350 MG/ML SOLN COMPARISON:  None. FINDINGS: Cardiovascular: No filling defects in the pulmonary arteries to suggest pulmonary emboli. Mild cardiomegaly. Moderate coronary artery and aortic calcifications. Mild aneurysmal dilatation aorta, 4.2 cm. Of the ascending thoracic Mediastinum/Nodes: No mediastinal, hilar, or axillary adenopathy. Trachea and esophagus are unremarkable. Thyroid unremarkable. Lungs/Pleura: Lungs are clear. No focal airspace opacities  or suspicious nodules. No effusions. Upper Abdomen: Imaging into the upper abdomen demonstrates no acute findings. Musculoskeletal: 2 Chest wall soft tissues are unremarkable. No acute bony abnormality. Review of the MIP images confirms the above findings. IMPRESSION: No evidence of pulmonary embolus. Cardiomegaly, coronary artery disease. 4.2 cm ascending thoracic aortic aneurysm. Recommend annual imaging followup by CTA or MRA. This recommendation follows 2010 ACCF/AHA/AATS/ACR/ASA/SCA/SCAI/SIR/STS/SVM Guidelines for the Diagnosis and Management of Patients with Thoracic Aortic Disease. Circulation. 2010; 121:: R830-N407 Aortic aneurysm NOS (ICD10-I71.9) Aortic Atherosclerosis (ICD10-I70.0). Electronically Signed   By: KRolm BaptiseM.D.   On: 11/08/2020 19:58   CT ABDOMEN PELVIS W CONTRAST  Result Date: 11/08/2020 CLINICAL DATA:  Abdominal pain EXAM: CT ABDOMEN AND PELVIS WITH CONTRAST TECHNIQUE: Multidetector CT imaging of the abdomen and pelvis was performed using the standard protocol following bolus administration of intravenous contrast. CONTRAST:  1043mOMNIPAQUE IOHEXOL 350 MG/ML SOLN COMPARISON:  None. FINDINGS: Lower chest: No acute abnormality.  Aortic atherosclerosis. Hepatobiliary: No focal hepatic abnormality. Gallbladder unremarkable. Pancreas: No focal abnormality or ductal dilatation. Spleen: No focal abnormality.  Normal size. Adrenals/Urinary Tract: No adrenal abnormality. No focal renal abnormality. No stones or hydronephrosis. Urinary bladder is unremarkable. Stomach/Bowel: Stomach, large and small bowel grossly unremarkable. Vascular/Lymphatic: Aortic atherosclerosis. No evidence of aneurysm or adenopathy. Reproductive: Mild prostate enlargement with calcifications. Other: No free fluid or free air. Musculoskeletal: No acute bony abnormality. IMPRESSION: No acute findings in the abdomen or pelvis. Aortic atherosclerosis. Prostate enlargement. Electronically Signed   By: KeRolm Baptise.D.    On: 11/08/2020 20:01   DG Chest Portable 1 View  Result Date: 11/08/2020 CLINICAL DATA:  Weakness. EXAM: PORTABLE CHEST 1 VIEW COMPARISON:  July 02, 2018 FINDINGS: Stable cardiomegaly. The hila and mediastinum are unchanged. No pneumothorax. No nodules or masses. No focal infiltrates. No overt edema. IMPRESSION: No active disease. Electronically Signed   By: DaDorise BullionII M.D   On: 11/08/2020 18:54   US[place]BDOMEN LIMITED RUQ (LIVER/GB)  Result Date: 11/08/2020 CLINICAL DATA:  Transaminitis EXAM: ULTRASOUND ABDOMEN LIMITED RIGHT UPPER QUADRANT COMPARISON:  None. FINDINGS: Gallbladder: Gallbladder is contracted with several stones measuring up to 1.5 cm. Gallbladder wall mildly thickened at 4 mm. Common bile duct: Diameter: Normal caliber, 5 mm Liver: No focal lesion identified. Within normal limits in parenchymal echogenicity. Portal vein is patent on color Doppler imaging with normal direction of blood flow towards the liver. Other: None. IMPRESSION: Cholelithiasis.  No sonographic evidence of acute cholecystitis. Electronically Signed   By: KeRolm Baptise.D.   On: 11/08/2020 20:04  EKG: Independently reviewed, with result as described above.    Assessment/Plan   LAYMON STOCKERT is a 86 y.o. male with medical history significant for essential hypertension, diverticulosis, who is admitted to [facility] on 11/08/2020 with severe sepsis in the setting of acute transaminitis after presenting from home to [facility] ED complaining of nausea/vomiting.    Principal Problem:   Severe sepsis ([place]) Active Problems:   Essential hypertension, benign   Transaminitis   Nausea & vomiting   Generalized weakness   Lactic acidosis     #) Severe sepsis: SIRS criteria met via presenting leukocytosis, tachycardia, tachypnea, with suspected underlying source of infection, although source not entirely clear at this time.  Patient's sepsis meets criteria to be considered severe nature  on the basis of concomitant evidence of endorgan damage in the form of elevated presenting lactate of 2.3, with repeat value trending down to 1.8 following interval IV fluid administration.  No evidence of associated hypotension.  Therefore, in the absence of elevated lactate of greater than or equal to 4.0 and in the absence of any hypotension, there is no indication for administration of a 30 mL/kg IVF bolus at this time.   While source of underlying function is not entirely clear at this time, differential includes acute cholecystitis given presenting nausea/vomiting as well as abdominal ultrasound demonstrating cholelithiasis with mildly thickened gallbladder wall, although no evidence of common bile duct dilation or overt choledocholithiasis.  In the setting of acute transaminitis with mildly elevated total bilirubin but no significant elevation of alkaline phosphatase, differential also includes not cholestatic acute hepatitis, of unclear source.   Case was discussed with the on-call gastroenterologist, Dr. Haig Prophet, who feels like cholestatic process is less likely in the absence of significant alkaline phosphatase elevation, and consequently feels that non-cholestatic hepatitis is more likely source for these findings.  Recommends MRCP for further evaluation, with GI to follow and additional associated recs pending at this time.  In the setting of the patient's report of daily consumption of alcohol, with potential underreporting of associated volume, differential includes acute alcoholic hepatitis, particularly given on cholestatic process as well as transaminitis and a 2-1 AST to ALT predominance. Additional GI recs to follow, including rec for steroid administration. Discriminant fxn calculation pending.  Differential still includes toxin mediated hepatitis, and will check urinary drug screen as well as LDH to further evaluate.  Of note, patient reports that he was hospitalized approximately 1 year  ago for an "infection in my liver, that took a long time to resolve", but is unsure as to additional details relating to this infection or hospitalization, which warrants further chart review / evaluation.   No other overt source of underlying infection identified at this time, including chest x-ray and CTA chest demonstrating no evidence of acute cardiopulmonary process, including no evidence of infiltrate or acute pulmonary embolism.  COVID-19/influenza PCR found to be negative.  No evidence of meningeal signs to suggest meningitis.  Urinalysis has been ordered, with result currently pending.  Given that differential appears to favor intra-abdominal source at this time, Zosyn was initiated in the ED following collection of blood cultures x2.   Plan: Continue Zosyn.  Monitor for results blood cultures x2.  Repeat CBC with differential in the morning.  Gastroenterology consulted, as above.  MRCP has been ordered.  Repeat CMP in the morning.  Check GGT and direct bilirubin.  Check LDH.  Check urinary drug screen.  Add on serum ethanol level.  Check acute  viral hepatitis panel.  Follow for results of urinalysis.  Monitor on telemetry.  Additional chart review to evaluate patient's report of previous hepatic infection.        #) Nausea/vomiting: 1 to 2 days of intermittent nausea resulting in several episodes of nonbloody, nonbilious emesis, as further quantified above.  Source not totally clear at this time, the differential includes potential intra-abdominal source of infection, as further outlined above.  Also check urinalysis and urinary drug screen.  Plan: Further evaluation management of severe sepsis with suspected underlying intra-abdominal infection, as above, including MRCP per gastroenterology consultation additional IV fluids given as further detailed below.  As needed Zofran.  Check urinalysis.  Add on serum magnesium level.  Repeat CMP and CBC in the morning.        #) Generalized  weakness: The patient reports 1 to 2 days of generalized weakness in the absence of any acute focal weakness.  Acute ischemic CVA is felt to be less likely, and presenting noncontrast CT that shows no evidence of acute intracranial process.  Suspect that his generalized weakness is multifactorial, with contributions from physiologic stress stemming from severe sepsis, as above, as well is relative dehydration in setting of concomitant nausea/vomiting.  Of note, TSH performed today was found to be within normal limits.   Plan: Further evaluation and management of severe sepsis, as above, including IV antibiotics.  Follow-up result urinalysis.  Check MMA.  Physical therapy consult has been ordered for the morning.  Repeat CMP and CBC in the morning.      #) Chronic Alcohol Abuse: Potential chronic alcohol abuse, potential underreporting of daily alcohol consumption, with the patient currently conveying that he consumes 2 cocktails per day, each containing 1 shot of liquor.  Of note, presenting labs notable for acute transaminitis without overt evidence of cholestatic pattern, with liver enzymes and a 2-1 AST to ALT predominance.  Most recent alcohol consumption occurred on 11/07/2020, the patient denies any history of alcohol withdrawal.  No evidence of overt alcohol withdrawal symptoms at this time, will closely monitor for development of such, including close attention to trend and vital signs, as well as close monitoring of ensuing electrolytes, as further detailed below.   Plan: consider consult to transition of care team placed. Close monitoring of ensuing BP and HR via routine VS. telemetry. Add-on serum Mg level. Check serum phosphorus level. Repeat CMP in the morning. Check INR. Add-on serum ethanol level. UDS. Banana bag x 1 has been ordered. Will refrain from initiation of CIWA protocol for now.        #) Essential hypertension: Outpatient hypertensive regimen includes HCTZ as well as  triamterene.   Plan: In the setting of presenting severe sepsis, will hold home antihypertensive medications for now.  Close monitoring of ensuing blood pressure via routine vital signs.      DVT prophylaxis: scd's  Code Status: DNR/DNI Family Communication: none Disposition Plan: Per Rounding Team Consults called: case/imaging were discussed with the on-call gastroenterologist, Dr. Haig Prophet, as further detailed above;   Admission status: Inpatient; PCU     Of note, this patient was added by me to the following Admit List/Treatment Team: [facility]mits.      PLEASE NOTE THAT DRAGON DICTATION SOFTWARE WAS USED IN THE CONSTRUCTION OF THIS NOTE.   [facility] Pager [phone] From 6PM - 6AM  Otherwise, please contact night-coverage  [email or web address] Password [facility]   11/08/2020, 9:34 PM

## 2020-11-08 NOTE — ED Notes (Signed)
Patient to MRI.

## 2020-11-09 ENCOUNTER — Other Ambulatory Visit: Payer: Self-pay

## 2020-11-09 DIAGNOSIS — K8050 Calculus of bile duct without cholangitis or cholecystitis without obstruction: Secondary | ICD-10-CM

## 2020-11-09 LAB — CBC WITH DIFFERENTIAL/PLATELET
Abs Immature Granulocytes: 0.07 10*3/uL (ref 0.00–0.07)
Basophils Absolute: 0 10*3/uL (ref 0.0–0.1)
Basophils Relative: 0 %
Eosinophils Absolute: 0.1 10*3/uL (ref 0.0–0.5)
Eosinophils Relative: 0 %
HCT: 41.6 % (ref 39.0–52.0)
Hemoglobin: 14.7 g/dL (ref 13.0–17.0)
Immature Granulocytes: 1 %
Lymphocytes Relative: 8 %
Lymphs Abs: 1 10*3/uL (ref 0.7–4.0)
MCH: 33.4 pg (ref 26.0–34.0)
MCHC: 35.3 g/dL (ref 30.0–36.0)
MCV: 94.5 fL (ref 80.0–100.0)
Monocytes Absolute: 1.2 10*3/uL — ABNORMAL HIGH (ref 0.1–1.0)
Monocytes Relative: 9 %
Neutro Abs: 11.1 10*3/uL — ABNORMAL HIGH (ref 1.7–7.7)
Neutrophils Relative %: 82 %
Platelets: 145 10*3/uL — ABNORMAL LOW (ref 150–400)
RBC: 4.4 MIL/uL (ref 4.22–5.81)
RDW: 12.5 % (ref 11.5–15.5)
WBC: 13.5 10*3/uL — ABNORMAL HIGH (ref 4.0–10.5)
nRBC: 0 % (ref 0.0–0.2)

## 2020-11-09 LAB — BLOOD CULTURE ID PANEL (REFLEXED) - BCID2

## 2020-11-09 LAB — COMPREHENSIVE METABOLIC PANEL
ALT: 376 U/L — ABNORMAL HIGH (ref 0–44)
AST: 367 U/L — ABNORMAL HIGH (ref 15–41)
Albumin: 3 g/dL — ABNORMAL LOW (ref 3.5–5.0)
Alkaline Phosphatase: 112 U/L (ref 38–126)
Anion gap: 7 (ref 5–15)
BUN: 14 mg/dL (ref 8–23)
CO2: 28 mmol/L (ref 22–32)
Calcium: 8.6 mg/dL — ABNORMAL LOW (ref 8.9–10.3)
Chloride: 104 mmol/L (ref 98–111)
Creatinine, Ser: 0.74 mg/dL (ref 0.61–1.24)
GFR, Estimated: >60 mL/min (ref >60)
Glucose, Bld: 113 mg/dL — ABNORMAL HIGH (ref 70–99)
Potassium: 3 mmol/L — ABNORMAL LOW (ref 3.5–5.1)
Sodium: 139 mmol/L (ref 135–145)
Total Bilirubin: 8.1 mg/dL — ABNORMAL HIGH (ref 0.3–1.2)
Total Protein: 5.9 g/dL — ABNORMAL LOW (ref 6.5–8.1)

## 2020-11-09 LAB — PROTIME-INR
INR: 1.3 — ABNORMAL HIGH (ref 0.8–1.2)
Prothrombin Time: 16.3 seconds — ABNORMAL HIGH (ref 11.4–15.2)

## 2020-11-09 LAB — BILIRUBIN, DIRECT: Bilirubin, Direct: 5.2 mg/dL — ABNORMAL HIGH (ref 0.0–0.2)

## 2020-11-09 LAB — LACTATE DEHYDROGENASE
LDH: 192 U/L (ref 98–192)
LDH: 201 U/L — ABNORMAL HIGH (ref 98–192)

## 2020-11-09 LAB — HEPATITIS PANEL, ACUTE
HCV Ab: NONREACTIVE
Hep A IgM: NONREACTIVE
Hep B C IgM: NONREACTIVE
Hepatitis B Surface Ag: NONREACTIVE

## 2020-11-09 LAB — PHOSPHORUS: Phosphorus: 3.1 mg/dL (ref 2.5–4.6)

## 2020-11-09 LAB — LACTIC ACID, PLASMA: Lactic Acid, Venous: 2.3 mmol/L (ref 0.5–1.9)

## 2020-11-09 LAB — ETHANOL: Alcohol, Ethyl (B): <10 mg/dL (ref <10)

## 2020-11-09 LAB — GAMMA GT: GGT: 219 U/L — ABNORMAL HIGH (ref 7–50)

## 2020-11-09 LAB — MAGNESIUM: Magnesium: 2 mg/dL (ref 1.7–2.4)

## 2020-11-09 MED ORDER — VANCOMYCIN HCL IN DEXTROSE 1-5 GM/200ML-% IV SOLN
1000.0000 mg | Freq: Two times a day (BID) | INTRAVENOUS | Status: DC
  Administered 2020-11-10 – 2020-11-11 (×3): 1000 mg via INTRAVENOUS
  Filled 2020-11-09 (×5): qty 200

## 2020-11-09 MED ORDER — FOLIC ACID 5 MG/ML IJ SOLN
1.0000 mg | Freq: Once | INTRAMUSCULAR | Status: AC
  Administered 2020-11-09: 1 mg via INTRAVENOUS
  Filled 2020-11-09 (×2): qty 0.2

## 2020-11-09 MED ORDER — THIAMINE HCL 100 MG/ML IJ SOLN
100.0000 mg | Freq: Every day | INTRAMUSCULAR | Status: DC
  Administered 2020-11-09 – 2020-11-14 (×6): 100 mg via INTRAVENOUS
  Filled 2020-11-09 (×6): qty 2

## 2020-11-09 MED ORDER — KCL-LACTATED RINGERS-D5W 20 MEQ/L IV SOLN
INTRAVENOUS | Status: DC
  Filled 2020-11-09 (×5): qty 1000

## 2020-11-09 MED ORDER — SODIUM CHLORIDE 0.9 % IV SOLN: 1.0000 mg | Freq: Once | INTRAVENOUS | Status: DC

## 2020-11-09 MED ORDER — LORAZEPAM 2 MG/ML IJ SOLN: 0.5000 mg | INTRAMUSCULAR | Status: DC | PRN

## 2020-11-09 MED ORDER — VANCOMYCIN HCL 2000 MG/400ML IV SOLN
2000.0000 mg | Freq: Once | INTRAVENOUS | Status: AC
  Administered 2020-11-09: 2000 mg via INTRAVENOUS
  Filled 2020-11-09: qty 400

## 2020-11-09 MED ORDER — POTASSIUM CHLORIDE 10 MEQ/100ML IV SOLN
10.0000 meq | INTRAVENOUS | Status: AC
  Administered 2020-11-09 (×2): 10 meq via INTRAVENOUS
  Filled 2020-11-09: qty 100

## 2020-11-09 MED ORDER — POTASSIUM CHLORIDE 2 MEQ/ML IV SOLN: INTRAVENOUS | Status: DC

## 2020-11-09 NOTE — Evaluation (Signed)
Physical Therapy Evaluation Patient Details Name: Brad Reed MRN: [id number] DOB: 14-Aug-1934 Today's Date: 11/09/2020   History of Present Illness  Pt is an 86 y/o M admitted on 11/08/20 with c/c of N&V. Pt being treated for severe sepsis. Imaging revealed choledocholithiasis & cholelithiasis. PMH: HTN, diverticulosis, knee arthritis, macular degeneration, obesity, peripheral neuropathy, sleep apnea  Clinical Impression  Pt seen for PT evaluation with pt reporting he was mod I with rollator in home, RW in community, still driving, living with his sister at [facility] IL[facility]. Pt reports he was receiving HHPT from [facility] prior to admission. On this date pt requires supervision overall for mobility with pt able to ambulate with RW. Will continue to follow pt acutely to progress endurance & balance. Pt can resume HHPT services he was receiving prior to admission.     Follow Up Recommendations Home health PT;Supervision for mobility/OOB    Equipment Recommendations  None recommended by PT    Recommendations for Other Services       Precautions / Restrictions Precautions Precautions: Fall Restrictions Weight Bearing Restrictions: No      Mobility  Bed Mobility Overal bed mobility: Needs Assistance Bed Mobility: Supine to Sit;Sit to Supine     Supine to sit: Supervision;HOB elevated Sit to supine: Supervision;HOB elevated   General bed mobility comments: extra time, use of 1 bed rail    Transfers Overall transfer level: Needs assistance Equipment used: Rolling walker (2 wheeled) Transfers: Sit to/from Stand Sit to Stand: Supervision         General transfer comment: poor safety awareness as pt transfers sit>stand with BUE on RW  Ambulation/Gait Ambulation/Gait assistance: Supervision Gait Distance (Feet): 100 Feet Assistive device: Rolling walker (2 wheeled) Gait Pattern/deviations: Decreased stride length Gait velocity: decreased      Stairs             Wheelchair Mobility    Modified Rankin (Stroke Patients Only)       Balance Overall balance assessment: Needs assistance Sitting-balance support: Bilateral upper extremity supported;Feet supported Sitting balance-Leahy Scale: Good       Standing balance-Leahy Scale: Fair Standing balance comment: BUE on RW but no LOB noted                             Pertinent Vitals/Pain Pain Assessment: No/denies pain    Home Living Family/patient expects to be discharged to::  (ILF at [facility])                      Prior Function Level of Independence: Independent with assistive device(s)         Comments: mod I with rollator in home, RW in community, denies falls, lives with sister     Hand Dominance        Extremity/Trunk Assessment   Upper Extremity Assessment Upper Extremity Assessment: Overall WFL for tasks assessed    Lower Extremity Assessment Lower Extremity Assessment: Generalized weakness       Communication   Communication: No difficulties  Cognition Arousal/Alertness: Awake/alert Behavior During Therapy: WFL for tasks assessed/performed Overall Cognitive Status: Within Functional Limits for tasks assessed                                        General Comments General comments (skin integrity, edema, etc.): Poor pleth on telemetry  O2 monitor (reading as low as 69% sometimes but pt in no apparent distress), SpO2 >90% at end of session on room air per finger pulse ox; pt noted to have bleeding from IV site & nurse made aware. PT assisted pt with donning clean gown & doffing brief saturated with urine.    Exercises     Assessment/Plan    PT Assessment Patient needs continued PT services  PT Problem List Decreased strength;Decreased mobility;Decreased activity tolerance;Decreased balance       PT Treatment Interventions DME instruction;Therapeutic exercise;Gait training;Balance training;Neuromuscular  re-education;Functional mobility training;Therapeutic activities;Patient/family education    PT Goals (Current goals can be found in the Care Plan section)  Acute Rehab PT Goals Patient Stated Goal: return to ILF/PLOF PT Goal Formulation: With patient Time For Goal Achievement: 11/23/20 Potential to Achieve Goals: Good    Frequency Min 2X/week   Barriers to discharge        Co-evaluation               AM-PAC PT "6 Clicks" Mobility  Outcome Measure Help needed turning from your back to your side while in a flat bed without using bedrails?: A Little Help needed moving from lying on your back to sitting on the side of a flat bed without using bedrails?: A Little Help needed moving to and from a bed to a chair (including a wheelchair)?: A Little Help needed standing up from a chair using your arms (e.g., wheelchair or bedside chair)?: A Little Help needed to walk in hospital room?: A Little Help needed climbing 3-5 steps with a railing? : A Little 6 Click Score: 18    End of Session Equipment Utilized During Treatment: Gait belt Activity Tolerance: Patient tolerated treatment well Patient left: in bed;with call bell/phone within reach Nurse Communication: Mobility status PT Visit Diagnosis: Muscle weakness (generalized) (M62.81);Unsteadiness on feet (R26.81)    Time: 4431-5400 PT Time Calculation (min) (ACUTE ONLY): 25 min   Charges:   PT Evaluation $PT Eval Low Complexity: 1 Low PT Treatments $Therapeutic Activity: 8-22 mins        Brad Reed, PT, DPT 11/09/20, 4:12 PM   Sandi Mariscal 11/09/2020, 4:11 PM

## 2020-11-09 NOTE — Progress Notes (Signed)
PROGRESS NOTE    Brad Reed  YIR:[id number] DOB: 06-22-1934 DOA: 11/08/2020 PCP: Venia Carbon, MD   Chief complaint.  Nausea vomiting. Brief Narrative:  Brad Reed is a 86 y.o. male with medical history significant for essential hypertension, diverticulosis, who is admitted to [facility] on 11/08/2020 with severe sepsis in the setting of acute transaminitis after presenting from home to [facility] ED complaining of nausea/vomiting.  Patient states that that he had a liver abscess a year ago which has since resolved. Upon arrival to emergency room, patient had a significant jaundice with a bilirubin of 8.4, MRCP showed stone in the common bile duct.  Patient has been evaluated by GI, scheduled for ERCP on 6/28.   Assessment & Plan:   Principal Problem:   Severe sepsis ([place]) Active Problems:   Essential hypertension, benign   Transaminitis   Nausea & vomiting   Generalized weakness   Lactic acidosis  #1.  Severe sepsis. Choledocholithiasis. Patient met severe sepsis criteria at time admission.  His lactic acid level was 2.3, which quickly improved after fluids.  He does not have a fever. Patient had only 1 set of blood cultures drawn in the emergency room, positive for Staph epidermidis.  Patient has received Zosyn in the emergency room, never received vancomycin.  I will redraw blood cultures x2 and cover with vancomycin. Staph epidermidis is unlikely from cholangitis. Patient is also covered with Zosyn in case patient has cholangitis. GI has seen the patient, scheduled for ERCP tomorrow.  Currently patient is n.p.o., I will start IV fluids.  #2.  Generalized weakness. Obtain PT/OT.  Patient may need nursing home placement.  3.  Chronic alcohol abuse. Hypokalemia. I will supplement potassium.  Due to old age, I will give lower dose as needed Ativan.  Start thiamine and folic acid.  4.  Essential hypertension Blood pressure medicine on hold due to  severe sepsis.    DVT prophylaxis: SCDs Code Status: DNR Family Communication:  Disposition Plan:    Status is: Inpatient  Remains inpatient appropriate because:Inpatient level of care appropriate due to severity of illness  Dispo: The patient is from: Home              Anticipated d/c is to: SNF              Patient currently is not medically stable to d/c.   Difficult to place patient No        No intake/output data recorded. Total I/O In: 1250 [I.V.:1000; IV Piggyback:250] Out: -      Consultants:  GI  Procedures: Pending  Antimicrobials: Vancomycin and Zosyn  Subjective: Patient feels better today, still has some abdominal discomfort, but no nausea vomiting.  Currently n.p.o. No fever or chills. No short of breath or cough. No dysuria hematuria  No chest pain or palpitation.  Objective: Vitals:   11/09/20 1100 11/09/20 1130 11/09/20 1200 11/09/20 1400  BP: (!) 166/88 (!) 146/82 (!) 146/78 138/76  Pulse: 70 74 75 73  Resp: (!) 22 (!) 24 20 (!) 23  Temp:      TempSrc:      SpO2: 96% 94% 100% 94%  Weight:      Height:        Intake/Output Summary (Last 24 hours) at 11/09/2020 1615 Last data filed at 11/09/2020 1343 Gross per 24 hour  Intake 1250 ml  Output --  Net 1250 ml   Filed Weights   11/08/20 1804  Weight:  125 kg    Examination:  General exam: Appears calm and comfortable  Respiratory system: Clear to auscultation. Respiratory effort normal. Cardiovascular system: S1 & S2 heard, RRR. No JVD, murmurs, rubs, gallops or clicks. No pedal edema. Gastrointestinal system: Abdomen is nondistended, soft and nontender. No organomegaly or masses felt. Normal bowel sounds heard. Central nervous system: Alert and oriented x2. No focal neurological deficits. Extremities: Symmetric 5 x 5 power. Skin: No rashes, lesions or ulcers Psychiatry: Judgement and insight appear normal. Mood & affect appropriate.     Data Reviewed: I have personally  reviewed following labs and imaging studies  CBC: Recent Labs  Lab 11/08/20 1810 11/09/20 0702  WBC 12.6* 13.5*  NEUTROABS 11.3* 11.1*  HGB 15.2 14.7  HCT 43.9 41.6  MCV 96.5 94.5  PLT 171 322*   Basic Metabolic Panel: Recent Labs  Lab 11/08/20 1810 11/09/20 0702  NA 137 139  K 3.5 3.0*  CL 102 104  CO2 27 28  GLUCOSE 128* 113*  BUN 14 14  CREATININE 1.04 0.74  CALCIUM 8.9 8.6*  MG 1.8 2.0  PHOS  --  3.1   GFR: Estimated Creatinine Clearance: 91.2 mL/min (by C-G formula based on SCr of 0.74 mg/dL). Liver Function Tests: Recent Labs  Lab 11/08/20 1810 11/09/20 0702  AST 784* 367*  ALT 476* 376*  ALKPHOS 132* 112  BILITOT 4.2* 8.1*  PROT 6.4* 5.9*  ALBUMIN 3.2* 3.0*   Recent Labs  Lab 11/08/20 1810  LIPASE 29   No results for input(s): AMMONIA in the last 168 hours. Coagulation Profile: Recent Labs  Lab 11/08/20 1810 11/09/20 0702  INR 1.2 1.3*   Cardiac Enzymes: No results for input(s): CKTOTAL, CKMB, CKMBINDEX, TROPONINI in the last 168 hours. BNP (last 3 results) No results for input(s): PROBNP in the last 8760 hours. HbA1C: No results for input(s): HGBA1C in the last 72 hours. CBG: No results for input(s): GLUCAP in the last 168 hours. Lipid Profile: No results for input(s): CHOL, HDL, LDLCALC, TRIG, CHOLHDL, LDLDIRECT in the last 72 hours. Thyroid Function Tests: Recent Labs    11/08/20 1810  TSH 2.882   Anemia Panel: No results for input(s): VITAMINB12, FOLATE, FERRITIN, TIBC, IRON, RETICCTPCT in the last 72 hours. Sepsis Labs: Recent Labs  Lab 11/08/20 1810 11/08/20 1848 11/08/20 2104  PROCALCITON 0.88  --   --   LATICACIDVEN  --  2.3* 1.8    Recent Results (from the past 240 hour(s))  Resp Panel by RT-PCR (Flu A&B, Covid) Nasopharyngeal Swab     Status: None   Collection Time: 11/08/20  6:17 PM   Specimen: Nasopharyngeal Swab; Nasopharyngeal(NP) swabs in vial transport medium  Result Value Ref Range Status   SARS  Coronavirus 2 by RT PCR NEGATIVE NEGATIVE Final    Comment: (NOTE) SARS-CoV-2 target nucleic acids are NOT DETECTED.  The SARS-CoV-2 RNA is generally detectable in upper respiratory specimens during the acute phase of infection. The lowest concentration of SARS-CoV-2 viral copies this assay can detect is 138 copies/mL. A negative result does not preclude SARS-Cov-2 infection and should not be used as the sole basis for treatment or other patient management decisions. A negative result may occur with  improper specimen collection/handling, submission of specimen other than nasopharyngeal swab, presence of viral mutation(s) within the areas targeted by this assay, and inadequate number of viral copies(<138 copies/mL). A negative result must be combined with clinical observations, patient history, and epidemiological information. The expected result is Negative.  Fact Sheet  for Patients:  EntrepreneurPulse.com.au  Fact Sheet for Healthcare Providers:  IncredibleEmployment.be  This test is no t yet approved or cleared by the [place] FDA and  has been authorized for detection and/or diagnosis of SARS-CoV-2 by FDA under an Emergency Use Authorization (EUA). This EUA will remain  in effect (meaning this test can be used) for the duration of the COVID-19 declaration under Section 564(b)(1) of the Act, 21 U.S.C.section 360bbb-3(b)(1), unless the authorization is terminated  or revoked sooner.       Influenza A by PCR NEGATIVE NEGATIVE Final   Influenza B by PCR NEGATIVE NEGATIVE Final    Comment: (NOTE) The Xpert Xpress SARS-CoV-2/FLU/RSV plus assay is intended as an aid in the diagnosis of influenza from Nasopharyngeal swab specimens and should not be used as a sole basis for treatment. Nasal washings and aspirates are unacceptable for Xpert Xpress SARS-CoV-2/FLU/RSV testing.  Fact Sheet for  Patients: EntrepreneurPulse.com.au  Fact Sheet for Healthcare Providers: IncredibleEmployment.be  This test is not yet approved or cleared by the [place] FDA and has been authorized for detection and/or diagnosis of SARS-CoV-2 by FDA under an Emergency Use Authorization (EUA). This EUA will remain in effect (meaning this test can be used) for the duration of the COVID-19 declaration under Section 564(b)(1) of the Act, 21 U.S.C. section 360bbb-3(b)(1), unless the authorization is terminated or revoked.  Performed at [facility], [address] [place], [place] [address]   Blood culture (routine single)     Status: None (Preliminary result)   Collection Time: 11/08/20  6:48 PM   Specimen: BLOOD  Result Value Ref Range Status   Specimen Description BLOOD  RT [facility]  Final   Special Requests   Final    BOTTLES DRAWN AEROBIC AND ANAEROBIC Blood Culture adequate volume   Culture  Setup Time   Final    Organism ID to follow IN BOTH AEROBIC AND ANAEROBIC BOTTLES GRAM POSITIVE COCCI CRITICAL RESULT CALLED TO, READ BACK BY AND VERIFIED WITH: MORGAN HICKS 11/09/20 1255 KLW Performed at [facility], [address] [place], [place] [address]    Culture GRAM POSITIVE COCCI  Final   Report Status PENDING  Incomplete  Blood Culture ID Panel (Reflexed)     Status: Abnormal   Collection Time: 11/08/20  6:48 PM  Result Value Ref Range Status   Enterococcus faecalis NOT DETECTED NOT DETECTED Final   Enterococcus Faecium NOT DETECTED NOT DETECTED Final   Listeria monocytogenes NOT DETECTED NOT DETECTED Final   Staphylococcus species DETECTED (A) NOT DETECTED Final    Comment: CRITICAL RESULT CALLED TO, READ BACK BY AND VERIFIED WITH: MORGAN HICKS 11/09/20 1255 KLW    Staphylococcus aureus (BCID) NOT DETECTED NOT DETECTED Final   Staphylococcus epidermidis DETECTED (A) NOT DETECTED Final    Comment: Methicillin (oxacillin) resistant  coagulase negative staphylococcus. Possible blood culture contaminant (unless isolated from more than one blood culture draw or clinical case suggests pathogenicity). No antibiotic treatment is indicated for blood  culture contaminants. CRITICAL RESULT CALLED TO, READ BACK BY AND VERIFIED WITH: MORGAN HICKS 11/09/20 1255 KLW    Staphylococcus lugdunensis NOT DETECTED NOT DETECTED Final   Streptococcus species NOT DETECTED NOT DETECTED Final   Streptococcus agalactiae NOT DETECTED NOT DETECTED Final   Streptococcus pneumoniae NOT DETECTED NOT DETECTED Final   Streptococcus pyogenes NOT DETECTED NOT DETECTED Final   A.calcoaceticus-baumannii NOT DETECTED NOT DETECTED Final   Bacteroides fragilis NOT DETECTED NOT DETECTED Final   Enterobacterales NOT DETECTED  NOT DETECTED Final   Enterobacter cloacae complex NOT DETECTED NOT DETECTED Final   Escherichia coli NOT DETECTED NOT DETECTED Final   Klebsiella aerogenes NOT DETECTED NOT DETECTED Final   Klebsiella oxytoca NOT DETECTED NOT DETECTED Final   Klebsiella pneumoniae NOT DETECTED NOT DETECTED Final   Proteus species NOT DETECTED NOT DETECTED Final   Salmonella species NOT DETECTED NOT DETECTED Final   Serratia marcescens NOT DETECTED NOT DETECTED Final   Haemophilus influenzae NOT DETECTED NOT DETECTED Final   Neisseria meningitidis NOT DETECTED NOT DETECTED Final   Pseudomonas aeruginosa NOT DETECTED NOT DETECTED Final   Stenotrophomonas maltophilia NOT DETECTED NOT DETECTED Final   Candida albicans NOT DETECTED NOT DETECTED Final   Candida auris NOT DETECTED NOT DETECTED Final   Candida glabrata NOT DETECTED NOT DETECTED Final   Candida krusei NOT DETECTED NOT DETECTED Final   Candida parapsilosis NOT DETECTED NOT DETECTED Final   Candida tropicalis NOT DETECTED NOT DETECTED Final   Cryptococcus neoformans/gattii NOT DETECTED NOT DETECTED Final   Methicillin resistance mecA/C DETECTED (A) NOT DETECTED Final    Comment: CRITICAL  RESULT CALLED TO, READ BACK BY AND VERIFIED WITH: MORGAN HICKS 11/09/20 1255 KLW Performed at [facility], [address] [place], [place] [address]          Radiology Studies: CT Head Wo Contrast  Result Date: 11/08/2020 CLINICAL DATA:  Neuro deficit, acute, stroke suspected EXAM: CT HEAD WITHOUT CONTRAST TECHNIQUE: Contiguous axial images were obtained from the base of the skull through the vertex without intravenous contrast. COMPARISON:  None. FINDINGS: Brain: There is atrophy and chronic small vessel disease changes. No acute intracranial abnormality. Specifically, no hemorrhage, hydrocephalus, mass lesion, acute infarction, or significant intracranial injury. Vascular: No hyperdense vessel or unexpected calcification. Skull: No acute calvarial abnormality. Sinuses/Orbits: Visualized paranasal sinuses and mastoids clear. Orbital soft tissues unremarkable. Other: None IMPRESSION: Atrophy, chronic microvascular disease. No acute intracranial abnormality. Electronically Signed   By: Rolm Baptise M.D.   On: 11/08/2020 19:50   CT Angio Chest PE W and/or Wo Contrast  Result Date: 11/08/2020 CLINICAL DATA:  PE suspected, high probability EXAM: CT ANGIOGRAPHY CHEST WITH CONTRAST TECHNIQUE: Multidetector CT imaging of the chest was performed using the standard protocol during bolus administration of intravenous contrast. Multiplanar CT image reconstructions and MIPs were obtained to evaluate the vascular anatomy. CONTRAST:  168m OMNIPAQUE IOHEXOL 350 MG/ML SOLN COMPARISON:  None. FINDINGS: Cardiovascular: No filling defects in the pulmonary arteries to suggest pulmonary emboli. Mild cardiomegaly. Moderate coronary artery and aortic calcifications. Mild aneurysmal dilatation aorta, 4.2 cm. Of the ascending thoracic Mediastinum/Nodes: No mediastinal, hilar, or axillary adenopathy. Trachea and esophagus are unremarkable. Thyroid unremarkable. Lungs/Pleura: Lungs are clear. No focal airspace  opacities or suspicious nodules. No effusions. Upper Abdomen: Imaging into the upper abdomen demonstrates no acute findings. Musculoskeletal: 2 Chest wall soft tissues are unremarkable. No acute bony abnormality. Review of the MIP images confirms the above findings. IMPRESSION: No evidence of pulmonary embolus. Cardiomegaly, coronary artery disease. 4.2 cm ascending thoracic aortic aneurysm. Recommend annual imaging followup by CTA or MRA. This recommendation follows 2010 ACCF/AHA/AATS/ACR/ASA/SCA/SCAI/SIR/STS/SVM Guidelines for the Diagnosis and Management of Patients with Thoracic Aortic Disease. Circulation. 2010; 121:: Q762-U633 Aortic aneurysm NOS (ICD10-I71.9) Aortic Atherosclerosis (ICD10-I70.0). Electronically Signed   By: KRolm BaptiseM.D.   On: 11/08/2020 19:58   CT ABDOMEN PELVIS W CONTRAST  Result Date: 11/08/2020 CLINICAL DATA:  Abdominal pain EXAM: CT ABDOMEN AND PELVIS WITH CONTRAST TECHNIQUE: Multidetector CT imaging of  the abdomen and pelvis was performed using the standard protocol following bolus administration of intravenous contrast. CONTRAST:  120m OMNIPAQUE IOHEXOL 350 MG/ML SOLN COMPARISON:  None. FINDINGS: Lower chest: No acute abnormality.  Aortic atherosclerosis. Hepatobiliary: No focal hepatic abnormality. Gallbladder unremarkable. Pancreas: No focal abnormality or ductal dilatation. Spleen: No focal abnormality.  Normal size. Adrenals/Urinary Tract: No adrenal abnormality. No focal renal abnormality. No stones or hydronephrosis. Urinary bladder is unremarkable. Stomach/Bowel: Stomach, large and small bowel grossly unremarkable. Vascular/Lymphatic: Aortic atherosclerosis. No evidence of aneurysm or adenopathy. Reproductive: Mild prostate enlargement with calcifications. Other: No free fluid or free air. Musculoskeletal: No acute bony abnormality. IMPRESSION: No acute findings in the abdomen or pelvis. Aortic atherosclerosis. Prostate enlargement. Electronically Signed   By: KRolm BaptiseM.D.   On: 11/08/2020 20:01   DG Chest Portable 1 View  Result Date: 11/08/2020 CLINICAL DATA:  Weakness. EXAM: PORTABLE CHEST 1 VIEW COMPARISON:  July 02, 2018 FINDINGS: Stable cardiomegaly. The hila and mediastinum are unchanged. No pneumothorax. No nodules or masses. No focal infiltrates. No overt edema. IMPRESSION: No active disease. Electronically Signed   By: DDorise BullionIII M.D   On: 11/08/2020 18:54   MR ABDOMEN WITH MRCP W CONTRAST  Result Date: 11/08/2020 CLINICAL DATA:  Right upper quadrant abdominal pain, transaminitis EXAM: MRI ABDOMEN WITH CONTRAST (WITH MRCP) TECHNIQUE: Multiplanar multisequence MR imaging of the abdomen was performed following the administration of intravenous contrast. Heavily T2-weighted images of the biliary and pancreatic ducts were obtained, and three-dimensional MRCP images were rendered by post processing. CONTRAST:  159mGADAVIST GADOBUTROL 1 MMOL/ML IV SOLN COMPARISON:  None. FINDINGS: Lower chest: No pleural effusion.  Mild global cardiomegaly. Hepatobiliary: There is limited evaluation of the hepatic parenchyma due to respiratory motion artifact with particular limitation of the a in opposed phase images. No focal intrahepatic masses identified. There is no intrahepatic biliary ductal dilation. The main and intrahepatic portal venous structures are patent. Cholelithiasis noted. Additionally, there are at least 3 intraluminal filling defects identified measuring up to 6 mm within the distal common duct in keeping with changes of cholelithiasis. These are best noted on image # 24/3 and image # 17 and 20/4. There is mild peribiliary edema identified involving the distal duct and within the pancreatico duodenal groove in keeping with mild cholangitis. The gallbladder itself is contracted and no pericholecystic inflammatory changes identified. Pancreas: Inflammatory changes within common duct distally and within the pancreatico duodenal groove do not  appear to extend to involve the head of the pancreas. The pancreas is otherwise unremarkable. Spleen:  Unremarkable Adrenals/Urinary Tract: The adrenal glands are unremarkable. Multiple simple tiny cortical cysts are seen arising from the right kidney. The kidneys are otherwise unremarkable. Stomach/Bowel: Visualized portions within the abdomen are unremarkable. Vascular/Lymphatic: Retroaortic left renal vein. The abdominal vasculature is otherwise unremarkable. No pathologic adenopathy within the upper abdomen. Other:  None. Musculoskeletal: Multiple lesions are identified within the visualized thoracolumbar spine most in keeping with intraosseous hemangioma, the largest seen within the T9 vertebral body. Additional smaller lesions are noted within T10 T12 and L2. No suspicious focal lesions are identified. IMPRESSION: Choledocholithiasis with at least 3 intraluminal calculi within the distal common duct measuring up to 7 mm in greatest dimension. Mild peribiliary inflammatory change involving the distal common duct and extending into the pancreatico duodenal groove. Cholelithiasis. Mild cardiomegaly. Electronically Signed   By: AsFidela SalisburyD   On: 11/08/2020 23:19   [place]OMEN LIMITED RUQ (LIVER/GB)  Result Date: 11/08/2020 CLINICAL  DATA:  Transaminitis EXAM: ULTRASOUND ABDOMEN LIMITED RIGHT UPPER QUADRANT COMPARISON:  None. FINDINGS: Gallbladder: Gallbladder is contracted with several stones measuring up to 1.5 cm. Gallbladder wall mildly thickened at 4 mm. Common bile duct: Diameter: Normal caliber, 5 mm Liver: No focal lesion identified. Within normal limits in parenchymal echogenicity. Portal vein is patent on color Doppler imaging with normal direction of blood flow towards the liver. Other: None. IMPRESSION: Cholelithiasis.  No sonographic evidence of acute cholecystitis. Electronically Signed   By: Rolm Baptise M.D.   On: 11/08/2020 20:04        Scheduled Meds: Continuous Infusions:   dextrose 5 % lactated ringers with KCl/Additives Pediatric custom IV fluid     piperacillin-tazobactam (ZOSYN)  IV 3.375 g (11/09/20 1222)   [START ON 11/10/2020] vancomycin     vancomycin       LOS: 1 day    Time spent: 34 minutes    Sharen Hones, MD [facility]   To contact the attending provider between 7A-7P or the covering provider during after hours 7P-7A, please log into the web site [email or web address] and access using universal [place] password for that web site. If you do not have the password, please call the hospital operator.  11/09/2020, 4:15 PM

## 2020-11-09 NOTE — ED Notes (Signed)
Pt IV leaking around the site. IV does not seem infiltrated. Reinforced IV dressing at this time.

## 2020-11-09 NOTE — Consult Note (Signed)
Consultation  Referring Provider:  ED    Admit date: 6/26 Consult date: 6/27         Reason for Consultation:    Abnormal liver enzymes         HPI:   Brad Reed is a 86 y.o. gentleman with history of obesity and moderate alcohol use who presented with nausea and found to have abnormal liver enzymes with subsequent MRCP showing choledocholithiasis. He is no on any blood thinners and on my evaluation he was feeling better, just sleepy. He states he was hospitalized 1 year ago for "liver infection" at [facility] but on my chart review I can't find evidence of this.  Past Medical History:  Diagnosis Date  . Angiodysplasia 02/13/2006  . Arthritis    knees  . Benign prostatic hypertrophy   . Colon polyps   . Diverticulosis of colon   . Hypertension   . Macular degeneration, wet (HCC)    Dr Chyrl CivatteApenzeller--left  . Obesity   . Peripheral neuropathy    feet  . Sleep apnea    DX'd when younger. declined CPAP.    Past Surgical History:  Procedure Laterality Date  . APPENDECTOMY    . CATARACT EXTRACTION W/PHACO Left 03/05/2018   Procedure: CATARACT EXTRACTION PHACO AND INTRAOCULAR LENS PLACEMENT (IOC) LEFT;  Surgeon: Nevada CraneKing, Bradley Mark, MD;  [facility];  Service: Ophthalmology;  Laterality: Left;  sleep apnea  . CATARACT EXTRACTION W/PHACO Right 03/19/2018   Procedure: CATARACT EXTRACTION PHACO AND INTRAOCULAR LENS PLACEMENT (IOC);  Surgeon: Nevada CraneKing, Bradley Mark, MD;  [facility];  Service: Ophthalmology;  Laterality: Right;  . IR GUIDED DRAIN W CATHETER PLACEMENT  07/03/2018  . IR RADIOLOGIST EVAL & MGMT  07/18/2018  . KNEE ARTHROSCOPY     BOTH KNEES  . MASTOID DEBRIDEMENT    . PILONIDAL CYST EXCISION  1960  . TONSILLECTOMY      Family History  Problem Relation Age of Onset  . Cancer Mother   . Obesity Sister   . Diabetes Sister   . Diabetes Maternal Uncle     Social History   Tobacco Use  . Smoking status: Former    Pack years: 0.00     Types: Cigarettes    Quit date: 05/17/1983    Years since quitting: 37.5  . Smokeless tobacco: Never  Vaping Use  . Vaping Use: Never used  Substance Use Topics  . Alcohol use: Yes    Alcohol/week: 14.0 standard drinks    Types: 14 Shots of liquor per week    Comment: Drinks 2 cocktails /day  . Drug use: No    Prior to Admission medications   Medication Sig Start Date End Date Taking? Authorizing Provider  multivitamin-lutein (OCUVITE-LUTEIN) CAPS capsule Take 1 capsule by mouth daily.   Yes [provider]  triamterene-hydrochlorothiazide (DYAZIDE) 37.5-25 MG capsule Take 1 each (1 capsule total) by mouth daily. 09/11/18  Yes Karie SchwalbeLetvak, Richard I, MD  potassium chloride (K-DUR) 10 MEQ tablet Take 1 tablet (10 mEq total) by mouth daily. Patient not taking: Reported on 11/09/2020 09/13/18   Karie SchwalbeLetvak, Richard I, MD    Current Facility-Administered Medications  Medication Dose Route Frequency Provider Last Rate Last Admin  . acetaminophen (TYLENOL) tablet 650 mg  650 mg Oral Q6H PRN Howerter, Justin B, DO       Or  . acetaminophen (TYLENOL) suppository 650 mg  650 mg Rectal Q6H PRN Howerter, Justin B, DO      .  dextrose 5% in lactated ringers with KCl 20 mEq/L infusion   Intravenous Continuous Tressie Ellis, RPH      . folic acid injection 1 mg  1 mg Intravenous Once Tressie Ellis, RPH      . LORazepam (ATIVAN) injection 0.5 mg  0.5 mg Intravenous Q4H PRN Marrion Coy, MD      . ondansetron [facility]) injection 4 mg  4 mg Intravenous Q6H PRN Howerter, Justin B, DO      . piperacillin-tazobactam (ZOSYN) IVPB 3.375 g  3.375 g Intravenous Q8H Sharen Hones, RPH   Stopped at 11/09/20 1624  . thiamine (B-1) injection 100 mg  100 mg Intravenous Daily Marrion Coy, MD      . Melene Muller ON 11/10/2020] vancomycin (VANCOCIN) IVPB 1000 mg/200 mL premix  1,000 mg Intravenous Q12H Aleda Grana, RPH      . vancomycin (VANCOREADY) IVPB 2000 mg/400 mL  2,000 mg Intravenous Once Aleda Grana, [facility]       Current Outpatient Medications  Medication Sig Dispense Refill  . multivitamin-lutein (OCUVITE-LUTEIN) CAPS capsule Take 1 capsule by mouth daily.    Marland Kitchen triamterene-hydrochlorothiazide (DYAZIDE) 37.5-25 MG capsule Take 1 each (1 capsule total) by mouth daily. 90 capsule 3  . potassium chloride (K-DUR) 10 MEQ tablet Take 1 tablet (10 mEq total) by mouth daily. (Patient not taking: Reported on 11/09/2020) 90 tablet 3    Allergies as of 11/08/2020 - Review Complete 11/08/2020  Allergen Reaction Noted  . Ampicillin Hives 07/15/2018     Review of Systems:    All systems reviewed and negative except where noted in HPI.  Review of Systems  Constitutional:  Positive for malaise/fatigue. Negative for chills and fever.  Respiratory:  Negative for cough.   Cardiovascular:  Negative for chest pain.  Gastrointestinal:  Negative for abdominal pain, nausea and vomiting.  Musculoskeletal:  Negative for joint pain.  Skin:  Negative for itching and rash.  Neurological:  Negative for focal weakness.  Psychiatric/Behavioral:  Negative for substance abuse.   All other systems reviewed and are negative.     Physical Exam:  Vital signs in last 24 hours: Temp:  [98.5 F (36.9 C)] 98.5 F (36.9 C) (06/26 1803) Pulse Rate:  [70-120] 73 (06/27 1400) Resp:  [13-36] 23 (06/27 1400) BP: (125-166)/(63-95) 138/76 (06/27 1400) SpO2:  [90 %-100 %] 94 % (06/27 1400) Weight:  [944 kg] 125 kg (06/26 1804)   General:   In NAD Head:  Normocephalic and atraumatic. Eyes:   No icterus.   Conjunctiva pink. Mouth: Mucosa pink moist, no lesions. Neck:  Supple; no masses felt Lungs:  No respiratory distress Heart:  RRR Abdomen:   non-tender, non-distended Msk:  MAEW x4, No clubbing or cyanosis. Strength 5/5. Symmetrical without gross deformities. Neurologic:  No focal deficits Skin:  Warm, dry, pink without significant lesions or rashes. Psych:  Alert and cooperative. Normal affect.  LAB  RESULTS: Recent Labs    11/08/20 1810 11/09/20 0702  WBC 12.6* 13.5*  HGB 15.2 14.7  HCT 43.9 41.6  PLT 171 145*   BMET Recent Labs    11/08/20 1810 11/09/20 0702  NA 137 139  K 3.5 3.0*  CL 102 104  CO2 27 28  GLUCOSE 128* 113*  BUN 14 14  CREATININE 1.04 0.74  CALCIUM 8.9 8.6*   LFT Recent Labs    11/09/20 0702  PROT 5.9*  ALBUMIN 3.0*  AST 367*  ALT 376*  ALKPHOS 112  BILITOT 8.1*  BILIDIR 5.2*   PT/INR Recent Labs    11/08/20 1810 11/09/20 0702  LABPROT 14.7 16.3*  INR 1.2 1.3*    STUDIES: CT Head Wo Contrast  Result Date: 11/08/2020 CLINICAL DATA:  Neuro deficit, acute, stroke suspected EXAM: CT HEAD WITHOUT CONTRAST TECHNIQUE: Contiguous axial images were obtained from the base of the skull through the vertex without intravenous contrast. COMPARISON:  None. FINDINGS: Brain: There is atrophy and chronic small vessel disease changes. No acute intracranial abnormality. Specifically, no hemorrhage, hydrocephalus, mass lesion, acute infarction, or significant intracranial injury. Vascular: No hyperdense vessel or unexpected calcification. Skull: No acute calvarial abnormality. Sinuses/Orbits: Visualized paranasal sinuses and mastoids clear. Orbital soft tissues unremarkable. Other: None IMPRESSION: Atrophy, chronic microvascular disease. No acute intracranial abnormality. Electronically Signed   By: Charlett Nose M.D.   On: 11/08/2020 19:50   CT Angio Chest PE W and/or Wo Contrast  Result Date: 11/08/2020 CLINICAL DATA:  PE suspected, high probability EXAM: CT ANGIOGRAPHY CHEST WITH CONTRAST TECHNIQUE: Multidetector CT imaging of the chest was performed using the standard protocol during bolus administration of intravenous contrast. Multiplanar CT image reconstructions and MIPs were obtained to evaluate the vascular anatomy. CONTRAST:  OMNIPAQUE IOHEXOL 350 MG/ML SOLN COMPARISON:  None. FINDINGS: Cardiovascular: No filling defects in the pulmonary arteries to  suggest pulmonary emboli. Mild cardiomegaly. Moderate coronary artery and aortic calcifications. Mild aneurysmal dilatation aorta, 4.2 cm. Of the ascending thoracic Mediastinum/Nodes: No mediastinal, hilar, or axillary adenopathy. Trachea and esophagus are unremarkable. Thyroid unremarkable. Lungs/Pleura: Lungs are clear. No focal airspace opacities or suspicious nodules. No effusions. Upper Abdomen: Imaging into the upper abdomen demonstrates no acute findings. Musculoskeletal: 2 Chest wall soft tissues are unremarkable. No acute bony abnormality. Review of the MIP images confirms the above findings. IMPRESSION: No evidence of pulmonary embolus. Cardiomegaly, coronary artery disease. 4.2 cm ascending thoracic aortic aneurysm. Recommend annual imaging followup by CTA or MRA. This recommendation follows 2010 ACCF/AHA/AATS/ACR/ASA/SCA/SCAI/SIR/STS/SVM Guidelines for the Diagnosis and Management of Patients with Thoracic Aortic Disease. Circulation. 2010; 121: T701-X793. Aortic aneurysm NOS (ICD10-I71.9) Aortic Atherosclerosis (ICD10-I70.0). Electronically Signed   By: Charlett Nose M.D.   On: 11/08/2020 19:58   CT ABDOMEN PELVIS W CONTRAST  Result Date: 11/08/2020 CLINICAL DATA:  Abdominal pain EXAM: CT ABDOMEN AND PELVIS WITH CONTRAST TECHNIQUE: Multidetector CT imaging of the abdomen and pelvis was performed using the standard protocol following bolus administration of intravenous contrast. CONTRAST:  OMNIPAQUE IOHEXOL 350 MG/ML SOLN COMPARISON:  None. FINDINGS: Lower chest: No acute abnormality.  Aortic atherosclerosis. Hepatobiliary: No focal hepatic abnormality. Gallbladder unremarkable. Pancreas: No focal abnormality or ductal dilatation. Spleen: No focal abnormality.  Normal size. Adrenals/Urinary Tract: No adrenal abnormality. No focal renal abnormality. No stones or hydronephrosis. Urinary bladder is unremarkable. Stomach/Bowel: Stomach, large and small bowel grossly unremarkable. Vascular/Lymphatic:  Aortic atherosclerosis. No evidence of aneurysm or adenopathy. Reproductive: Mild prostate enlargement with calcifications. Other: No free fluid or free air. Musculoskeletal: No acute bony abnormality. IMPRESSION: No acute findings in the abdomen or pelvis. Aortic atherosclerosis. Prostate enlargement. Electronically Signed   By: Charlett Nose M.D.   On: 11/08/2020 20:01   DG Chest Portable 1 View  Result Date: 11/08/2020 CLINICAL DATA:  Weakness. EXAM: PORTABLE CHEST 1 VIEW COMPARISON:  July 02, 2018 FINDINGS: Stable cardiomegaly. The hila and mediastinum are unchanged. No pneumothorax. No nodules or masses. No focal infiltrates. No overt edema. IMPRESSION: No active disease. Electronically Signed   By: Gerome Sam III M.D   On: 11/08/2020 18:54  MR ABDOMEN WITH MRCP W CONTRAST  Result Date: 11/08/2020 CLINICAL DATA:  Right upper quadrant abdominal pain, transaminitis EXAM: MRI ABDOMEN WITH CONTRAST (WITH MRCP) TECHNIQUE: Multiplanar multisequence MR imaging of the abdomen was performed following the administration of intravenous contrast. Heavily T2-weighted images of the biliary and pancreatic ducts were obtained, and three-dimensional MRCP images were rendered by post processing. CONTRAST:  80mL GADAVIST GADOBUTROL 1 MMOL/ML IV SOLN COMPARISON:  None. FINDINGS: Lower chest: No pleural effusion.  Mild global cardiomegaly. Hepatobiliary: There is limited evaluation of the hepatic parenchyma due to respiratory motion artifact with particular limitation of the a in opposed phase images. No focal intrahepatic masses identified. There is no intrahepatic biliary ductal dilation. The main and intrahepatic portal venous structures are patent. Cholelithiasis noted. Additionally, there are at least 3 intraluminal filling defects identified measuring up to 6 mm within the distal common duct in keeping with changes of cholelithiasis. These are best noted on image # 24/3 and image # 17 and 20/4. There is mild  peribiliary edema identified involving the distal duct and within the pancreatico duodenal groove in keeping with mild cholangitis. The gallbladder itself is contracted and no pericholecystic inflammatory changes identified. Pancreas: Inflammatory changes within common duct distally and within the pancreatico duodenal groove do not appear to extend to involve the head of the pancreas. The pancreas is otherwise unremarkable. Spleen:  Unremarkable Adrenals/Urinary Tract: The adrenal glands are unremarkable. Multiple simple tiny cortical cysts are seen arising from the right kidney. The kidneys are otherwise unremarkable. Stomach/Bowel: Visualized portions within the abdomen are unremarkable. Vascular/Lymphatic: Retroaortic left renal vein. The abdominal vasculature is otherwise unremarkable. No pathologic adenopathy within the upper abdomen. Other:  None. Musculoskeletal: Multiple lesions are identified within the visualized thoracolumbar spine most in keeping with intraosseous hemangioma, the largest seen within the T9 vertebral body. Additional smaller lesions are noted within T10 T12 and L2. No suspicious focal lesions are identified. IMPRESSION: Choledocholithiasis with at least 3 intraluminal calculi within the distal common duct measuring up to 7 mm in greatest dimension. Mild peribiliary inflammatory change involving the distal common duct and extending into the pancreatico duodenal groove. Cholelithiasis. Mild cardiomegaly. Electronically Signed   By: Helyn Numbers MD   On: 11/08/2020 23:19   US ABDOMEN LIMITED RUQ (LIVER/GB)  Result Date: 11/08/2020 CLINICAL DATA:  Transaminitis EXAM: ULTRASOUND ABDOMEN LIMITED RIGHT UPPER QUADRANT COMPARISON:  None. FINDINGS: Gallbladder: Gallbladder is contracted with several stones measuring up to 1.5 cm. Gallbladder wall mildly thickened at 4 mm. Common bile duct: Diameter: Normal caliber, 5 mm Liver: No focal lesion identified. Within normal limits in parenchymal  echogenicity. Portal vein is patent on color Doppler imaging with normal direction of blood flow towards the liver. Other: None. IMPRESSION: Cholelithiasis.  No sonographic evidence of acute cholecystitis. Electronically Signed   By: Charlett Nose M.D.   On: 11/08/2020 20:04       Impression / Plan:   86 y/o gentleman with obesity found have abnormal liver enzymes and choledocholithiasis on MRCP  - discussed with performing endoscopist and will plan on ERCP tomorrow - NPO at midnight - hold any heparin products - continue IV antibiotics - will need surgical consult for consideration of cholecystectomy  Please call with any questions or concerns.  Merlyn Lot MD, MPH [facility] GI

## 2020-11-09 NOTE — ED Notes (Signed)
Continues to rest quietly. 

## 2020-11-09 NOTE — ED Notes (Signed)
Pt able to stand and pivot from the stretcher to the commode.

## 2020-11-09 NOTE — ED Notes (Signed)
Pt's son, Dr. Renee Ramus and POA spoke to this RN. Son had concerns about the pt's level of care at home. Pt is currently living at [facility] currently. Son is concerned that pt need increased LOC. Son states that the pt is more confused, weak, and cannot take care of himself. States that his living conditions are poor and would like to get a SW or CM involved. Dr. Chipper Herb MD made aware and consult to [facility] order.

## 2020-11-09 NOTE — Progress Notes (Signed)
PHARMACY - PHYSICIAN COMMUNICATION CRITICAL VALUE ALERT - BLOOD CULTURE IDENTIFICATION (BCID)  Brad Reed is an 86 y.o. male who presented to [facility] on 11/08/2020 with a chief complaint of nausea/vomiting  Assessment:  6/26 blood culture (single set) with GPC, BCID = MRSE.  He is on pip/tazo for SIRS/sepsis.  His LFTS are elevated and imaging reveals choledocholithiasis.  Per notes, admits to daily alcohol consumption.   Name of physician (or Provider) Contacted: Dr Chipper Herb  Current antibiotics: piperacillin/tazobactam  Changes to prescribed antibiotics recommended:  Recommendations accepted by provider -repeat blood cultures and start vancomycin if clinically warrants  Results for orders placed or performed during the hospital encounter of 11/08/20  Blood Culture ID Panel (Reflexed) (Collected: 11/08/2020  6:48 PM)  Result Value Ref Range   Enterococcus faecalis NOT DETECTED NOT DETECTED   Enterococcus Faecium NOT DETECTED NOT DETECTED   Listeria monocytogenes NOT DETECTED NOT DETECTED   Staphylococcus species DETECTED (A) NOT DETECTED   Staphylococcus aureus (BCID) NOT DETECTED NOT DETECTED   Staphylococcus epidermidis DETECTED (A) NOT DETECTED   Staphylococcus lugdunensis NOT DETECTED NOT DETECTED   Streptococcus species NOT DETECTED NOT DETECTED   Streptococcus agalactiae NOT DETECTED NOT DETECTED   Streptococcus pneumoniae NOT DETECTED NOT DETECTED   Streptococcus pyogenes NOT DETECTED NOT DETECTED   A.calcoaceticus-baumannii NOT DETECTED NOT DETECTED   Bacteroides fragilis NOT DETECTED NOT DETECTED   Enterobacterales NOT DETECTED NOT DETECTED   Enterobacter cloacae complex NOT DETECTED NOT DETECTED   Escherichia coli NOT DETECTED NOT DETECTED   Klebsiella aerogenes NOT DETECTED NOT DETECTED   Klebsiella oxytoca NOT DETECTED NOT DETECTED   Klebsiella pneumoniae NOT DETECTED NOT DETECTED   Proteus species NOT DETECTED NOT DETECTED   Salmonella species NOT DETECTED NOT  DETECTED   Serratia marcescens NOT DETECTED NOT DETECTED   Haemophilus influenzae NOT DETECTED NOT DETECTED   Neisseria meningitidis NOT DETECTED NOT DETECTED   Pseudomonas aeruginosa NOT DETECTED NOT DETECTED   Stenotrophomonas maltophilia NOT DETECTED NOT DETECTED   Candida albicans NOT DETECTED NOT DETECTED   Candida auris NOT DETECTED NOT DETECTED   Candida glabrata NOT DETECTED NOT DETECTED   Candida krusei NOT DETECTED NOT DETECTED   Candida parapsilosis NOT DETECTED NOT DETECTED   Candida tropicalis NOT DETECTED NOT DETECTED   Cryptococcus neoformans/gattii NOT DETECTED NOT DETECTED   Methicillin resistance mecA/C DETECTED (A) NOT DETECTED    Juliette Alcide, PharmD, BCPS.   Work Cell: [phone] 11/09/2020 1:44 PM

## 2020-11-09 NOTE — ED Notes (Signed)
Called lab to obtain blood work at this time.  

## 2020-11-09 NOTE — Consult Note (Signed)
Pharmacy Antibiotic Note  DOVER HEAD is a 86 y.o. male admitted on 11/08/2020 withnausea/vomiting and generalized weakness/ Sepsis. Initial concern was for intra-abdominal infection. Pharmacy consulted for Zosyn dosing. Imaging reveals choledocholithiasis.  Blood cultures from 6/26 with GPC in both bottles of a SINGLE set.  BCID = MRSE.  No vancomycin given prior to 6/27   Plan: Start vancomycin 2gm IV x 1 then 1gm IV q12h for possible bacteremia.   AUC = 505 using SCr 0.8 and Vd = 0.5 L/kg Goal AUC 400-600 Since no vancomycin given, repeat blood cultures now Check levels if remains on vancomycin > 4 days Zosyn 3.375g IV q8h (4 hour infusion). Follow renal function closely with above combination.  Follow-up ability to de-escalate or stop above therapy based on clinic findings/suspicion for infection and repeat blood cultures Suspect can stop vancomycin if repeat blood cultures are unrevealing    Height: 5\' 10"  (177.8 cm) Weight: 125 kg (275 lb 9.2 oz) IBW/kg (Calculated) : 73  Temp (24hrs), Avg:98.5 F (36.9 C), Min:98.5 F (36.9 C), Max:98.5 F (36.9 C)  Recent Labs  Lab 11/08/20 1810 11/08/20 1848 11/08/20 2104 11/09/20 0702  WBC 12.6*  --   --  13.5*  CREATININE 1.04  --   --  0.74  LATICACIDVEN  --  2.3* 1.8  --      Estimated Creatinine Clearance: 91.2 mL/min (by C-G formula based on SCr of 0.74 mg/dL).    Allergies  Allergen Reactions   Ampicillin Hives    Antimicrobials this admission: zosyn 6/26 >>  Vanco  6/27 >>  Dose adjustments this admission: N/a  Microbiology results: 6/26 BCx (SINGLE): GPC, MRSE 6/16 UCx: sent    Thank you for allowing pharmacy to be a part of this patient's care.  7/16, PharmD, BCPS.   Work Cell: [phone] 11/09/2020 1:58 PM

## 2020-11-09 NOTE — ED Notes (Signed)
Patient assisted to toilet.  2 person assist needed.

## 2020-11-09 NOTE — ED Notes (Signed)
Pt soiled bed with urine at this time. Full linen change including sheets, pads, brief, and gown were changed.

## 2020-11-09 NOTE — ED Notes (Signed)
Informed RN bed assigned 

## 2020-11-10 ENCOUNTER — Inpatient Hospital Stay: Payer: Medicare HMO | Admitting: Anesthesiology

## 2020-11-10 ENCOUNTER — Encounter
Admission: EM | Disposition: A | Discharge status: Skilled Nursing Facility | Payer: Self-pay | Source: Home / Self Care | Attending: Internal Medicine

## 2020-11-10 ENCOUNTER — Encounter: Payer: Self-pay | Admitting: Internal Medicine

## 2020-11-10 ENCOUNTER — Inpatient Hospital Stay: Payer: Medicare HMO

## 2020-11-10 DIAGNOSIS — K8050 Calculus of bile duct without cholangitis or cholecystitis without obstruction: Secondary | ICD-10-CM

## 2020-11-10 HISTORY — PX: ERCP: SHX5425

## 2020-11-10 LAB — URINE CULTURE

## 2020-11-10 LAB — CBC WITH DIFFERENTIAL/PLATELET
Abs Immature Granulocytes: 0.07 10*3/uL (ref 0.00–0.07)
Basophils Absolute: 0.1 10*3/uL (ref 0.0–0.1)
Basophils Relative: 1 %
Eosinophils Absolute: 0.4 10*3/uL (ref 0.0–0.5)
Eosinophils Relative: 4 %
HCT: 40.5 % (ref 39.0–52.0)
Hemoglobin: 14.1 g/dL (ref 13.0–17.0)
Immature Granulocytes: 1 %
Lymphocytes Relative: 7 %
Lymphs Abs: 0.7 10*3/uL (ref 0.7–4.0)
MCH: 33.2 pg (ref 26.0–34.0)
MCHC: 34.8 g/dL (ref 30.0–36.0)
MCV: 95.3 fL (ref 80.0–100.0)
Monocytes Absolute: 0.8 10*3/uL (ref 0.1–1.0)
Monocytes Relative: 7 %
Neutro Abs: 8.5 10*3/uL — ABNORMAL HIGH (ref 1.7–7.7)
Neutrophils Relative %: 80 %
Platelets: 146 10*3/uL — ABNORMAL LOW (ref 150–400)
RBC: 4.25 MIL/uL (ref 4.22–5.81)
RDW: 12.8 % (ref 11.5–15.5)
WBC: 10.5 10*3/uL (ref 4.0–10.5)
nRBC: 0 % (ref 0.0–0.2)

## 2020-11-10 LAB — COMPREHENSIVE METABOLIC PANEL
ALT: 227 U/L — ABNORMAL HIGH (ref 0–44)
AST: 136 U/L — ABNORMAL HIGH (ref 15–41)
Albumin: 2.8 g/dL — ABNORMAL LOW (ref 3.5–5.0)
Alkaline Phosphatase: 108 U/L (ref 38–126)
Anion gap: 9 (ref 5–15)
BUN: 16 mg/dL (ref 8–23)
CO2: 28 mmol/L (ref 22–32)
Calcium: 8.5 mg/dL — ABNORMAL LOW (ref 8.9–10.3)
Chloride: 102 mmol/L (ref 98–111)
Creatinine, Ser: 0.77 mg/dL (ref 0.61–1.24)
GFR, Estimated: >60 mL/min (ref >60)
Glucose, Bld: 96 mg/dL (ref 70–99)
Potassium: 3.2 mmol/L — ABNORMAL LOW (ref 3.5–5.1)
Sodium: 139 mmol/L (ref 135–145)
Total Bilirubin: 9.4 mg/dL — ABNORMAL HIGH (ref 0.3–1.2)
Total Protein: 5.6 g/dL — ABNORMAL LOW (ref 6.5–8.1)

## 2020-11-10 SURGERY — ERCP, WITH INTERVENTION IF INDICATED
Anesthesia: General

## 2020-11-10 MED ORDER — INDOMETHACIN 50 MG RE SUPP
RECTAL | Status: AC
  Filled 2020-11-10: qty 2

## 2020-11-10 MED ORDER — DEXMEDETOMIDINE (PRECEDEX) IN NS 20 MCG/5ML (4 MCG/ML) IV SYRINGE
PREFILLED_SYRINGE | INTRAVENOUS | Status: DC | PRN
Start: 2020-11-10 — End: 2020-11-10
  Administered 2020-11-10: 12 ug via INTRAVENOUS

## 2020-11-10 MED ORDER — FENTANYL CITRATE (PF) 100 MCG/2ML IJ SOLN
INTRAMUSCULAR | Status: AC
  Filled 2020-11-10: qty 2

## 2020-11-10 MED ORDER — POTASSIUM CHLORIDE 10 MEQ/100ML IV SOLN
10.0000 meq | INTRAVENOUS | Status: AC
  Administered 2020-11-10 (×2): 10 meq via INTRAVENOUS
  Filled 2020-11-10 (×4): qty 100

## 2020-11-10 MED ORDER — DEXMEDETOMIDINE (PRECEDEX) IN NS 20 MCG/5ML (4 MCG/ML) IV SYRINGE
PREFILLED_SYRINGE | INTRAVENOUS | Status: AC
  Filled 2020-11-10: qty 5

## 2020-11-10 MED ORDER — EPHEDRINE 5 MG/ML INJ
INTRAVENOUS | Status: AC
  Filled 2020-11-10: qty 10

## 2020-11-10 MED ORDER — SUCCINYLCHOLINE CHLORIDE 20 MG/ML IJ SOLN
INTRAMUSCULAR | Status: DC | PRN
  Administered 2020-11-10: 140 mg via INTRAVENOUS

## 2020-11-10 MED ORDER — INDOMETHACIN 50 MG RE SUPP
100.0000 mg | Freq: Once | RECTAL | Status: AC
  Administered 2020-11-10: 100 mg via RECTAL

## 2020-11-10 MED ORDER — FENTANYL CITRATE (PF) 100 MCG/2ML IJ SOLN
INTRAMUSCULAR | Status: DC | PRN
  Administered 2020-11-10: 25 ug via INTRAVENOUS
  Administered 2020-11-10: 50 ug via INTRAVENOUS
  Administered 2020-11-10: 25 ug via INTRAVENOUS

## 2020-11-10 MED ORDER — ONDANSETRON HCL 4 MG/2ML IJ SOLN
INTRAMUSCULAR | Status: DC | PRN
  Administered 2020-11-10: 4 mg via INTRAVENOUS

## 2020-11-10 MED ORDER — DEXAMETHASONE SODIUM PHOSPHATE 10 MG/ML IJ SOLN
INTRAMUSCULAR | Status: DC | PRN
  Administered 2020-11-10: 10 mg via INTRAVENOUS

## 2020-11-10 MED ORDER — PROPOFOL 10 MG/ML IV BOLUS
INTRAVENOUS | Status: DC | PRN
  Administered 2020-11-10: 50 mg via INTRAVENOUS
  Administered 2020-11-10: 150 mg via INTRAVENOUS

## 2020-11-10 MED ORDER — LIDOCAINE HCL (CARDIAC) PF 100 MG/5ML IV SOSY
PREFILLED_SYRINGE | INTRAVENOUS | Status: DC | PRN
  Administered 2020-11-10: 100 mg via INTRAVENOUS

## 2020-11-10 MED ORDER — LACTATED RINGERS IV SOLN
INTRAVENOUS | Status: DC
  Administered 2020-11-10: 1000 mL via INTRAVENOUS

## 2020-11-10 NOTE — Progress Notes (Signed)
PROGRESS NOTE    Brad Reed  WUJ:[id number] DOB: 02/12/1935 DOA: 11/08/2020 PCP: Karie SchwalbeLetvak, Richard I, MD   Chief complaint.  Nausea vomiting Brief Narrative:  Brad Reed is a 86 y.o. male with medical history significant for essential hypertension, diverticulosis, who is [facility] on 11/08/2020 with severe sepsis in the setting of acute transaminitis after presenting from home to [facility] ED complaining of nausea/vomiting.  Patient states that that he had a liver abscess a year ago which has since resolved. Upon arrival to emergency room, patient had a significant jaundice with a bilirubin of 8.4, MRCP showed stone in the common bile duct.  Patient has been evaluated by GI, scheduled for ERCP on 6/28.   Assessment & Plan:   Principal Problem:   Severe sepsis (HCC) Active Problems:   Essential hypertension, benign   Transaminitis   Nausea & vomiting   Generalized weakness   Lactic acidosis   Choledocholithiasis  #1.  Severe sepsis. Choledocholithiasis Initial blood culture positive for Staphylococcus epidermis x1, repeat cultures are pending. I will continue Zosyn and vancomycin. Patient is scheduled for ERCP today.  #2.  Generalized weakness. Continue PT/OT. Family is concerned that patient was intermittently confused, concerned for dementia.  Will obtain psych evaluation. Patient will need nursing home placement.  3.  Chronic alcohol abuse. Hypokalemia. Will give another dose of IV potassium. No evidence of alcohol withdrawal, continue thiamine folic acid and as needed Ativan.  #4 essential hypertension.      DVT prophylaxis: SCDs Code Status: DNR Family Communication:  Disposition Plan:    Status is: Inpatient  Remains inpatient appropriate because:Inpatient level of care appropriate due to severity of illness  Dispo: The patient is from: Home              Anticipated d/c is to: SNF              Patient currently is not  medically stable to d/c.   Difficult to place patient No        I/O last 3 completed shifts: In: 1672.3 [I.V.:1099; IV Piggyback:573.3] Out: -  No intake/output data recorded.     Consultants:  GI  Procedures: ERCP  Antimicrobials:  Zosyn and vancomycin  Subjective: Patient doing well this morning, he slept well last night.  Abdominal discomfort has resolved.  No nausea vomiting. Denies any short of breath or cough, no hypoxia. No chest pain or palpitation. No fever or chills. No dysuria or hematuria  Objective: Vitals:   11/09/20 1927 11/10/20 0430 11/10/20 0706 11/10/20 1106  BP: 137/66 (!) 141/70 123/68 (!) 146/83  Pulse: 75 75  77  Resp: 18 16 20 18   Temp: 97.8 F (36.6 C) 97.9 F (36.6 C) (!) 97.5 F (36.4 C) 98.1 F (36.7 C)  TempSrc:      SpO2: 96% 96%  97%  Weight:  121.6 kg    Height:        Intake/Output Summary (Last 24 hours) at 11/10/2020 1211 Last data filed at 11/10/2020 0643 Gross per 24 hour  Intake 1522.34 ml  Output --  Net 1522.34 ml   Filed Weights   11/08/20 1804 11/10/20 0430  Weight: 125 kg 121.6 kg    Examination:  General exam: Appears calm and comfortable  Respiratory system: Clear to auscultation. Respiratory effort normal. Cardiovascular system: S1 & S2 heard, RRR. No JVD, murmurs, rubs, gallops or clicks. No pedal edema. Gastrointestinal system: Abdomen is nondistended, soft and nontender. No  organomegaly or masses felt. Normal bowel sounds heard. Central nervous system: Alert and oriented. No focal neurological deficits. Extremities: Symmetric. Skin: No rashes, lesions or ulcers Psychiatry: Mood & affect appropriate.     Data Reviewed: I have personally reviewed following labs and imaging studies  CBC: Recent Labs  Lab 11/08/20 1810 11/09/20 0702 11/10/20 0650  WBC 12.6* 13.5* 10.5  NEUTROABS 11.3* 11.1* 8.5*  HGB 15.2 14.7 14.1  HCT 43.9 41.6 40.5  MCV 96.5 94.5 95.3  PLT 171 145* 146*   Basic  Metabolic Panel: Recent Labs  Lab 11/08/20 1810 11/09/20 0702 11/10/20 0650  NA 137 139 139  K 3.5 3.0* 3.2*  CL 102 104 102  CO2 27 28 28   GLUCOSE 128* 113* 96  BUN 14 14 16   CREATININE 1.04 0.74 0.77  CALCIUM 8.9 8.6* 8.5*  MG 1.8 2.0  --   PHOS  --  3.1  --    GFR: Estimated Creatinine Clearance: 89.8 mL/min (by C-G formula based on SCr of 0.77 mg/dL). Liver Function Tests: Recent Labs  Lab 11/08/20 1810 11/09/20 0702 11/10/20 0650  AST 784* 367* 136*  ALT 476* 376* 227*  ALKPHOS 132* 112 108  BILITOT 4.2* 8.1* 9.4*  PROT 6.4* 5.9* 5.6*  ALBUMIN 3.2* 3.0* 2.8*   Recent Labs  Lab 11/08/20 1810  LIPASE 29   No results for input(s): AMMONIA in the last 168 hours. Coagulation Profile: Recent Labs  Lab 11/08/20 1810 11/09/20 0702  INR 1.2 1.3*   Cardiac Enzymes: No results for input(s): CKTOTAL, CKMB, CKMBINDEX, TROPONINI in the last 168 hours. BNP (last 3 results) No results for input(s): PROBNP in the last 8760 hours. HbA1C: No results for input(s): HGBA1C in the last 72 hours. CBG: No results for input(s): GLUCAP in the last 168 hours. Lipid Profile: No results for input(s): CHOL, HDL, LDLCALC, TRIG, CHOLHDL, LDLDIRECT in the last 72 hours. Thyroid Function Tests: Recent Labs    11/08/20 1810  TSH 2.882   Anemia Panel: No results for input(s): VITAMINB12, FOLATE, FERRITIN, TIBC, IRON, RETICCTPCT in the last 72 hours. Sepsis Labs: Recent Labs  Lab 11/08/20 1810 11/08/20 1848 11/08/20 2104  PROCALCITON 0.88  --   --   LATICACIDVEN  --  2.3* 1.8    Recent Results (from the past 240 hour(s))  Resp Panel by RT-PCR (Flu A&B, Covid) Nasopharyngeal Swab     Status: None   Collection Time: 11/08/20  6:17 PM   Specimen: Nasopharyngeal Swab; Nasopharyngeal(NP) swabs in vial transport medium  Result Value Ref Range Status   SARS Coronavirus 2 by RT PCR NEGATIVE NEGATIVE Final    Comment: (NOTE) SARS-CoV-2 target nucleic acids are NOT  DETECTED.  The SARS-CoV-2 RNA is generally detectable in upper respiratory specimens during the acute phase of infection. The lowest concentration of SARS-CoV-2 viral copies this assay can detect is 138 copies/mL. A negative result does not preclude SARS-Cov-2 infection and should not be used as the sole basis for treatment or other patient management decisions. A negative result may occur with  improper specimen collection/handling, submission of specimen other than nasopharyngeal swab, presence of viral mutation(s) within the areas targeted by this assay, and inadequate number of viral copies(<138 copies/mL). A negative result must be combined with clinical observations, patient history, and epidemiological information. The expected result is Negative.  Fact Sheet for Patients:  2105  Fact Sheet for Healthcare Providers:  11/10/20  This test is no t yet approved or cleared by the [employer]  FDA and  has been authorized for detection and/or diagnosis of SARS-CoV-2 by FDA under an Emergency Use Authorization (EUA). This EUA will remain  in effect (meaning this test can be used) for the duration of the COVID-19 declaration under Section 564(b)(1) of the Act, 21 U.S.C.section 360bbb-3(b)(1), unless the authorization is terminated  or revoked sooner.       Influenza A by PCR NEGATIVE NEGATIVE Final   Influenza B by PCR NEGATIVE NEGATIVE Final    Comment: (NOTE) The Xpert Xpress SARS-CoV-2/FLU/RSV plus assay is intended as an aid in the diagnosis of influenza from Nasopharyngeal swab specimens and should not be used as a sole basis for treatment. Nasal washings and aspirates are unacceptable for Xpert Xpress SARS-CoV-2/FLU/RSV testing.  Fact Sheet for Patients: [employer]  Fact Sheet for Healthcare Providers: [employer]ious[employer]ker[employer]it  This test is not yet  approved or cleared by the [place] FDA and has been authorized for detection and/or diagnosis of SARS-CoV-2 by FDA under an Emergency Use Authorization (EUA). This EUA will remain in effect (meaning this test can be used) for the duration of the COVID-19 declaration under Section 564(b)(1) of the Act, 21 U.S.C. section 360bbb-3(b)(1), unless the authorization is terminated or revoked.  Performed at [facility], [address] [place], [place] [address]   Blood culture (routine single)     Status: Abnormal (Preliminary result)   Collection Time: 11/08/20  6:48 PM   Specimen: BLOOD  Result Value Ref Range Status   Specimen Description   Final    BLOOD  RT [facility] Performed at [facility], [address] [place], [place] [address]    Special Requests   Final    BOTTLES DRAWN AEROBIC AND ANAEROBIC Blood Culture adequate volume Performed at [facility], [address] [place], [place] [address]    Culture  Setup Time   Final    IN BOTH AEROBIC AND ANAEROBIC BOTTLES GRAM POSITIVE COCCI CRITICAL RESULT CALLED TO, READ BACK BY AND VERIFIED WITH: MORGAN HICKS 11/09/20 1255 KLW    Culture (A)  Final    STAPHYLOCOCCUS EPIDERMIDIS THE SIGNIFICANCE OF ISOLATING THIS ORGANISM FROM A SINGLE SET OF BLOOD CULTURES WHEN MULTIPLE SETS ARE DRAWN IS UNCERTAIN. PLEASE NOTIFY THE MICROBIOLOGY DEPARTMENT WITHIN ONE WEEK IF SPECIATION AND SENSITIVITIES ARE REQUIRED. Performed at [facility], [address], [place], [place] [address]    Report Status PENDING  Incomplete  Blood Culture ID Panel (Reflexed)     Status: Abnormal   Collection Time: 11/08/20  6:48 PM  Result Value Ref Range Status   Enterococcus faecalis NOT DETECTED NOT DETECTED Final   Enterococcus Faecium NOT DETECTED NOT DETECTED Final   Listeria monocytogenes NOT DETECTED NOT DETECTED Final   Staphylococcus species DETECTED (A) NOT DETECTED Final    Comment: CRITICAL RESULT CALLED TO, READ  BACK BY AND VERIFIED WITH: MORGAN HICKS 11/09/20 1255 KLW    Staphylococcus aureus (BCID) NOT DETECTED NOT DETECTED Final   Staphylococcus epidermidis DETECTED (A) NOT DETECTED Final    Comment: Methicillin (oxacillin) resistant coagulase negative staphylococcus. Possible blood culture contaminant (unless isolated from more than one blood culture draw or clinical case suggests pathogenicity). No antibiotic treatment is indicated for blood  culture contaminants. CRITICAL RESULT CALLED TO, READ BACK BY AND VERIFIED WITH: MORGAN HICKS 11/09/20 1255 KLW    Staphylococcus lugdunensis NOT DETECTED NOT DETECTED Final   Streptococcus species NOT DETECTED NOT DETECTED Final   Streptococcus agalactiae NOT DETECTED NOT DETECTED Final  Streptococcus pneumoniae NOT DETECTED NOT DETECTED Final   Streptococcus pyogenes NOT DETECTED NOT DETECTED Final   A.calcoaceticus-baumannii NOT DETECTED NOT DETECTED Final   Bacteroides fragilis NOT DETECTED NOT DETECTED Final   Enterobacterales NOT DETECTED NOT DETECTED Final   Enterobacter cloacae complex NOT DETECTED NOT DETECTED Final   Escherichia coli NOT DETECTED NOT DETECTED Final   Klebsiella aerogenes NOT DETECTED NOT DETECTED Final   Klebsiella oxytoca NOT DETECTED NOT DETECTED Final   Klebsiella pneumoniae NOT DETECTED NOT DETECTED Final   Proteus species NOT DETECTED NOT DETECTED Final   Salmonella species NOT DETECTED NOT DETECTED Final   Serratia marcescens NOT DETECTED NOT DETECTED Final   Haemophilus influenzae NOT DETECTED NOT DETECTED Final   Neisseria meningitidis NOT DETECTED NOT DETECTED Final   Pseudomonas aeruginosa NOT DETECTED NOT DETECTED Final   Stenotrophomonas maltophilia NOT DETECTED NOT DETECTED Final   Candida albicans NOT DETECTED NOT DETECTED Final   Candida auris NOT DETECTED NOT DETECTED Final   Candida glabrata NOT DETECTED NOT DETECTED Final   Candida krusei NOT DETECTED NOT DETECTED Final   Candida parapsilosis NOT  DETECTED NOT DETECTED Final   Candida tropicalis NOT DETECTED NOT DETECTED Final   Cryptococcus neoformans/gattii NOT DETECTED NOT DETECTED Final   Methicillin resistance mecA/C DETECTED (A) NOT DETECTED Final    Comment: CRITICAL RESULT CALLED TO, READ BACK BY AND VERIFIED WITH: MORGAN HICKS 11/09/20 1255 KLW Performed at [facility], [address] [place], [place] [address]   Urine culture     Status: Abnormal   Collection Time: 11/08/20  9:53 PM   Specimen: In/Out Cath Urine  Result Value Ref Range Status   Specimen Description   Final    IN/OUT CATH URINE Performed at [facility], [address] [place], [place] [address]    Special Requests   Final    NONE Performed at [facility], [address] [place], [place] [address]    Culture MULTIPLE SPECIES PRESENT, SUGGEST RECOLLECTION (A)  Final   Report Status 11/10/2020 FINAL  Final  CULTURE, BLOOD (ROUTINE X 2) w Reflex to ID Panel     Status: None (Preliminary result)   Collection Time: 11/09/20  2:22 PM   Specimen: BLOOD RIGHT HAND  Result Value Ref Range Status   Specimen Description BLOOD RIGHT HAND  Final   Special Requests   Final    BOTTLES DRAWN AEROBIC AND ANAEROBIC Blood Culture results may not be optimal due to an inadequate volume of blood received in culture bottles   Culture   Final    NO GROWTH < 24 HOURS Performed at [facility], [address] [place], [place] [address]    Report Status PENDING  Incomplete  CULTURE, BLOOD (ROUTINE X 2) w Reflex to ID Panel     Status: None (Preliminary result)   Collection Time: 11/09/20  2:22 PM   Specimen: BLOOD LEFT HAND  Result Value Ref Range Status   Specimen Description BLOOD LEFT HAND  Final   Special Requests   Final    BOTTLES DRAWN AEROBIC AND ANAEROBIC Blood Culture results may not be optimal due to an inadequate volume of blood received in culture bottles   Culture   Final    NO GROWTH < 24 HOURS Performed at  [facility], [address] [place], [place] [address]    Report Status PENDING  Incomplete         Radiology Studies: CT Head Wo Contrast  Result Date: 11/08/2020 CLINICAL DATA:  Neuro deficit, acute, stroke suspected EXAM: CT HEAD WITHOUT CONTRAST TECHNIQUE: Contiguous axial images were obtained from the base of the skull through the vertex without intravenous contrast. COMPARISON:  None. FINDINGS: Brain: There is atrophy and chronic small vessel disease changes. No acute intracranial abnormality. Specifically, no hemorrhage, hydrocephalus, mass lesion, acute infarction, or significant intracranial injury. Vascular: No hyperdense vessel or unexpected calcification. Skull: No acute calvarial abnormality. Sinuses/Orbits: Visualized paranasal sinuses and mastoids clear. Orbital soft tissues unremarkable. Other: None IMPRESSION: Atrophy, chronic microvascular disease. No acute intracranial abnormality. Electronically Signed   By: Charlett Nose M.D.   On: 11/08/2020 19:50   CT Angio Chest PE W and/or Wo Contrast  Result Date: 11/08/2020 CLINICAL DATA:  PE suspected, high probability EXAM: CT ANGIOGRAPHY CHEST WITH CONTRAST TECHNIQUE: Multidetector CT imaging of the chest was performed using the standard protocol during bolus administration of intravenous contrast. Multiplanar CT image reconstructions and MIPs were obtained to evaluate the vascular anatomy. CONTRAST:  OMNIPAQUE IOHEXOL 350 MG/ML SOLN COMPARISON:  None. FINDINGS: Cardiovascular: No filling defects in the pulmonary arteries to suggest pulmonary emboli. Mild cardiomegaly. Moderate coronary artery and aortic calcifications. Mild aneurysmal dilatation aorta, 4.2 cm. Of the ascending thoracic Mediastinum/Nodes: No mediastinal, hilar, or axillary adenopathy. Trachea and esophagus are unremarkable. Thyroid unremarkable. Lungs/Pleura: Lungs are clear. No focal airspace opacities or suspicious nodules. No effusions. Upper Abdomen:  Imaging into the upper abdomen demonstrates no acute findings. Musculoskeletal: 2 Chest wall soft tissues are unremarkable. No acute bony abnormality. Review of the MIP images confirms the above findings. IMPRESSION: No evidence of pulmonary embolus. Cardiomegaly, coronary artery disease. 4.2 cm ascending thoracic aortic aneurysm. Recommend annual imaging followup by CTA or MRA. This recommendation follows 2010 ACCF/AHA/AATS/ACR/ASA/SCA/SCAI/SIR/STS/SVM Guidelines for the Diagnosis and Management of Patients with Thoracic Aortic Disease. Circulation. 2010; 121: W098-J191. Aortic aneurysm NOS (ICD10-I71.9) Aortic Atherosclerosis (ICD10-I70.0). Electronically Signed   By: Charlett Nose M.D.   On: 11/08/2020 19:58   CT ABDOMEN PELVIS W CONTRAST  Result Date: 11/08/2020 CLINICAL DATA:  Abdominal pain EXAM: CT ABDOMEN AND PELVIS WITH CONTRAST TECHNIQUE: Multidetector CT imaging of the abdomen and pelvis was performed using the standard protocol following bolus administration of intravenous contrast. CONTRAST:  OMNIPAQUE IOHEXOL 350 MG/ML SOLN COMPARISON:  None. FINDINGS: Lower chest: No acute abnormality.  Aortic atherosclerosis. Hepatobiliary: No focal hepatic abnormality. Gallbladder unremarkable. Pancreas: No focal abnormality or ductal dilatation. Spleen: No focal abnormality.  Normal size. Adrenals/Urinary Tract: No adrenal abnormality. No focal renal abnormality. No stones or hydronephrosis. Urinary bladder is unremarkable. Stomach/Bowel: Stomach, large and small bowel grossly unremarkable. Vascular/Lymphatic: Aortic atherosclerosis. No evidence of aneurysm or adenopathy. Reproductive: Mild prostate enlargement with calcifications. Other: No free fluid or free air. Musculoskeletal: No acute bony abnormality. IMPRESSION: No acute findings in the abdomen or pelvis. Aortic atherosclerosis. Prostate enlargement. Electronically Signed   By: Charlett Nose M.D.   On: 11/08/2020 20:01   DG Chest Portable 1  View  Result Date: 11/08/2020 CLINICAL DATA:  Weakness. EXAM: PORTABLE CHEST 1 VIEW COMPARISON:  July 02, 2018 FINDINGS: Stable cardiomegaly. The hila and mediastinum are unchanged. No pneumothorax. No nodules or masses. No focal infiltrates. No overt edema. IMPRESSION: No active disease. Electronically Signed   By: Gerome Sam III M.D   On: 11/08/2020 18:54   MR ABDOMEN WITH MRCP W CONTRAST  Result Date: 11/08/2020 CLINICAL DATA:  Right upper quadrant abdominal pain, transaminitis EXAM: MRI ABDOMEN WITH CONTRAST (WITH MRCP) TECHNIQUE: Multiplanar multisequence  MR imaging of the abdomen was performed following the administration of intravenous contrast. Heavily T2-weighted images of the biliary and pancreatic ducts were obtained, and three-dimensional MRCP images were rendered by post processing. CONTRAST:  10mL GADAVIST GADOBUTROL 1 MMOL/ML IV SOLN COMPARISON:  None. FINDINGS: Lower chest: No pleural effusion.  Mild global cardiomegaly. Hepatobiliary: There is limited evaluation of the hepatic parenchyma due to respiratory motion artifact with particular limitation of the a in opposed phase images. No focal intrahepatic masses identified. There is no intrahepatic biliary ductal dilation. The main and intrahepatic portal venous structures are patent. Cholelithiasis noted. Additionally, there are at least 3 intraluminal filling defects identified measuring up to 6 mm within the distal common duct in keeping with changes of cholelithiasis. These are best noted on image # 24/3 and image # 17 and 20/4. There is mild peribiliary edema identified involving the distal duct and within the pancreatico duodenal groove in keeping with mild cholangitis. The gallbladder itself is contracted and no pericholecystic inflammatory changes identified. Pancreas: Inflammatory changes within common duct distally and within the pancreatico duodenal groove do not appear to extend to involve the head of the pancreas. The  pancreas is otherwise unremarkable. Spleen:  Unremarkable Adrenals/Urinary Tract: The adrenal glands are unremarkable. Multiple simple tiny cortical cysts are seen arising from the right kidney. The kidneys are otherwise unremarkable. Stomach/Bowel: Visualized portions within the abdomen are unremarkable. Vascular/Lymphatic: Retroaortic left renal vein. The abdominal vasculature is otherwise unremarkable. No pathologic adenopathy within the upper abdomen. Other:  None. Musculoskeletal: Multiple lesions are identified within the visualized thoracolumbar spine most in keeping with intraosseous hemangioma, the largest seen within the T9 vertebral body. Additional smaller lesions are noted within T10 T12 and L2. No suspicious focal lesions are identified. IMPRESSION: Choledocholithiasis with at least 3 intraluminal calculi within the distal common duct measuring up to 7 mm in greatest dimension. Mild peribiliary inflammatory change involving the distal common duct and extending into the pancreatico duodenal groove. Cholelithiasis. Mild cardiomegaly. Electronically Signed   By: Helyn Numbers MD   On: 11/08/2020 23:19   US ABDOMEN LIMITED RUQ (LIVER/GB)  Result Date: 11/08/2020 CLINICAL DATA:  Transaminitis EXAM: ULTRASOUND ABDOMEN LIMITED RIGHT UPPER QUADRANT COMPARISON:  None. FINDINGS: Gallbladder: Gallbladder is contracted with several stones measuring up to 1.5 cm. Gallbladder wall mildly thickened at 4 mm. Common bile duct: Diameter: Normal caliber, 5 mm Liver: No focal lesion identified. Within normal limits in parenchymal echogenicity. Portal vein is patent on color Doppler imaging with normal direction of blood flow towards the liver. Other: None. IMPRESSION: Cholelithiasis.  No sonographic evidence of acute cholecystitis. Electronically Signed   By: Charlett Nose M.D.   On: 11/08/2020 20:04        Scheduled Meds:  indomethacin  100 mg Rectal Once   indomethacin       [MAR Hold] thiamine injection   100 mg Intravenous Daily   Continuous Infusions:  dextrose 5% lactated ringers with KCl 20 mEq/L Stopped (11/10/20 0450)   lactated ringers 10 mL/hr at 11/10/20 1153   [MAR Hold] piperacillin-tazobactam (ZOSYN)  IV 12.5 mL/hr at 11/10/20 0643   [facility] Hold] potassium chloride 10 mEq (11/10/20 1058)   [MAR Hold] vancomycin Stopped (11/10/20 0434)     LOS: 2 days    Time spent: 28 minutes    Marrion Coy, MD [facility]   To contact the attending provider between 7A-7P or the covering provider during after hours 7P-7A, please log into the web site [email or web address] and access  using universal [place] password for that web site. If you do not have the password, please call the hospital operator.  11/10/2020, 12:11 PM

## 2020-11-10 NOTE — [place] FL2 (Signed)
[place] MEDICAID FL2 LEVEL OF CARE SCREENING TOOL     IDENTIFICATION  Patient Name: Brad Reed Birthdate: 1935/03/20 Sex: male Admission Date (Current Location): 11/08/2020  [place] and [place] Number:  Chiropodist and Address:  [facility], [address] [place], [place] [address]      Provider Number: [id number]  Attending Physician Name and Address:  Marrion Coy, MD  Relative Name and Phone Number:  Sherrine Maples (son) [phone]    Current Level of Care: Hospital Recommended Level of Care: Assisted Living Facility Prior Approval Number:    Date Approved/Denied:   PASRR Number: [id number]  Discharge Plan: Other (Comment) (ALF)    Current Diagnoses: Patient Active Problem List   Diagnosis Date Noted   Choledocholithiasis 11/09/2020   Severe sepsis (HCC) 11/08/2020   Transaminitis 11/08/2020   Nausea & vomiting 11/08/2020   Generalized weakness 11/08/2020   Lactic acidosis 11/08/2020   PAD (peripheral artery disease) (HCC) 10/08/2020   Contusion of left hand 07/16/2020   Mood disorder (HCC) 04/06/2020   Osteoarthritis of right knee 04/01/2019   Sensorineural hearing loss (SNHL), bilateral 10/01/2018   Impacted cerumen of both ears 10/01/2018   Memory loss 09/11/2018   Hearing loss 09/11/2018   Urinary incontinence 08/03/2018   Arrhythmia 09/14/2016   Impaired fasting glucose 08/26/2014   Advance directive discussed with patient 08/26/2014   Peripheral neuropathy    Routine general medical examination at a health care facility 08/16/2011   Macular degeneration, wet (HCC)    Obesity, Class III, BMI 40-49.9 (morbid obesity) (HCC) 08/03/2010   BPH with obstruction/lower urinary tract symptoms 12/24/2007   Essential hypertension, benign 12/13/2006   DIVERTICULOSIS, COLON 12/13/2006    Orientation RESPIRATION BLADDER Height & Weight     Self, Time, Situation, Place  Normal Incontinent, External catheter Weight: 268 lb 1.3  oz (121.6 kg) Height:  5\' 10"  (177.8 cm)  BEHAVIORAL SYMPTOMS/MOOD NEUROLOGICAL BOWEL NUTRITION STATUS      Continent Diet (see discharge summary)  AMBULATORY STATUS COMMUNICATION OF NEEDS Skin   Limited Assist (rolling walker) Verbally Normal                       Personal Care Assistance Level of Assistance  Bathing, Feeding, Dressing, Total care Bathing Assistance: Limited assistance (needs reminder at times) Feeding assistance: Independent Dressing Assistance: Limited assistance Total Care Assistance: Limited assistance   Functional Limitations Info  Sight, Hearing, Speech Sight Info: Impaired Hearing Info: Adequate Speech Info: Adequate    SPECIAL CARE FACTORS FREQUENCY  PT (By licensed PT)     PT Frequency: home health PT rec              Contractures Contractures Info: Not present    Additional Factors Info  Code Status, Allergies Code Status Info: DNR Allergies Info: Ampicillin           Current Medications (11/10/2020):  This is the current hospital active medication list Current Facility-Administered Medications  Medication Dose Route Frequency Provider Last Rate Last Admin   acetaminophen (TYLENOL) tablet 650 mg  650 mg Oral Q6H PRN Howerter, Justin B, DO       Or   acetaminophen (TYLENOL) suppository 650 mg  650 mg Rectal Q6H PRN Howerter, Justin B, DO       dextrose 5% in lactated ringers with KCl 20 mEq/L infusion   Intravenous Continuous 11/12/2020, MD   Stopped at 11/10/20 0450   LORazepam (ATIVAN) injection 0.5 mg  0.5 mg Intravenous Q4H PRN Marrion Coy, MD       ondansetron [facility]) injection 4 mg  4 mg Intravenous Q6H PRN Howerter, Justin B, DO       piperacillin-tazobactam (ZOSYN) IVPB 3.375 g  3.375 g Intravenous Q8H Cheron Every E, RPH 12.5 mL/hr at 11/10/20 3559 Infusion Verify at 11/10/20 0643   potassium chloride 10 mEq in 100 mL IVPB  10 mEq Intravenous Q1 Hr x 4 Beers, Brandon D, RPH       thiamine (B-1) injection 100 mg  100  mg Intravenous Daily Marrion Coy, MD   100 mg at 11/09/20 1838   vancomycin (VANCOCIN) IVPB 1000 mg/200 mL premix  1,000 mg Intravenous Q12H Aleda Grana, [facility]   Stopped at 11/10/20 7416     Discharge Medications: Please see discharge summary for a list of discharge medications.  Relevant Imaging Results:  Relevant Lab Results:   Additional Information SSN:[id number]  Gildardo Griffes, LCSW

## 2020-11-10 NOTE — Op Note (Signed)
[facility] Patient Name: Brad Reed Procedure Date: 11/10/2020 11:59 AM MRN: [id number] Account #: [id number] Date of Birth: [birth year] Admit Type: Inpatient Age: 86 Room: [facility] ENDO ROOM 4 Gender: Male Note Status: Finalized Procedure:             ERCP Indications:           Common bile duct stone(s) Providers:             Midge Minium MD, MD Medicines:             General Anesthesia Complications:         No immediate complications. Procedure:             Pre-Anesthesia Assessment:                        - Prior to the procedure, a History and Physical was                         performed, and patient medications and allergies were                         reviewed. The patient's tolerance of previous                         anesthesia was also reviewed. The risks and benefits                         of the procedure and the sedation options and risks                         were discussed with the patient. All questions were                         answered, and informed consent was obtained. Prior                         Anticoagulants: The patient has taken no previous                         anticoagulant or antiplatelet agents. ASA Grade                         Assessment: III - A patient with severe systemic                         disease. After reviewing the risks and benefits, the                         patient was deemed in satisfactory condition to                         undergo the procedure.                        After obtaining informed consent, the scope was passed                         under direct vision. Throughout the procedure, the  patient's blood pressure, pulse, and oxygen                         saturations were monitored continuously. The [employer] D single use duodenoscope was                         introduced through the mouth, and used to inject                          contrast into and used to inject contrast into the                         bile duct. The ERCP was accomplished without                         difficulty. The patient tolerated the procedure well. Findings:      The scout film was normal. The esophagus was successfully intubated       under direct vision. The scope was advanced to a normal major papilla in       the descending duodenum without detailed examination of the pharynx,       larynx and associated structures, and upper GI tract. The upper GI tract       was grossly normal. The bile duct was deeply cannulated with the       short-nosed traction sphincterotome. Contrast was injected. I personally       interpreted the bile duct images. There was brisk flow of contrast       through the ducts. Image quality was excellent. Contrast extended to the       entire biliary tree. The main bile duct contained 5. A wire was passed       into the biliary tree. A 7 mm biliary sphincterotomy was made with a       traction (standard) sphincterotome using ERBE electrocautery. There was       no post-sphincterotomy bleeding. The biliary tree was swept with a 15 mm       balloon starting at the bifurcation. All stones were removed. Impression:            - Choledocholithiasis was found. Complete removal was                         accomplished by biliary sphincterotomy and balloon                         extraction.                        - A biliary sphincterotomy was performed.                        - The biliary tree was swept. Recommendation:        - Return patient to hospital ward for ongoing care.                        - Clear liquid diet today.                        -  Watch for pancreatitis, bleeding, perforation, and                         cholangitis.                        - Continue present medications. Procedure Code(s):     --- Professional ---                        [phone], Endoscopic retrograde  cholangiopancreatography                         (ERCP); with removal of calculi/debris from                         biliary/pancreatic duct(s)                        43262, Endoscopic retrograde cholangiopancreatography                         (ERCP); with sphincterotomy/papillotomy                        [phone], Endoscopic catheterization of the biliary                         ductal system, radiological supervision and                         interpretation Diagnosis Code(s):     --- Professional ---                        K80.50, Calculus of bile duct without cholangitis or                         cholecystitis without obstruction CPT copyright 2019 American Medical Association. All rights reserved. The codes documented in this report are preliminary and upon coder review may  be revised to meet current compliance requirements. Midge Minium MD, MD 11/10/2020 12:56:57 PM This report has been signed electronically. Number of Addenda: 0 Note Initiated On: 11/10/2020 11:59 AM Estimated Blood Loss:  Estimated blood loss: none.      [facility]

## 2020-11-10 NOTE — Transfer of Care (Signed)
Immediate Anesthesia Transfer of Care Note  Patient: Brad Reed  Procedure(s) Performed: ENDOSCOPIC RETROGRADE CHOLANGIOPANCREATOGRAPHY (ERCP)  Patient Location: PACU  Anesthesia Type:General  Level of Consciousness: awake and alert   Airway & Oxygen Therapy: Patient Spontanous Breathing and Patient connected to face mask oxygen  Post-op Assessment: Report given to RN and Post -op Vital signs reviewed and stable  Post vital signs: Reviewed  Last Vitals:  Vitals Value Taken Time  BP 129/72 11/10/20 1305  Temp    Pulse 82 11/10/20 1306  Resp 24 11/10/20 1307  SpO2 94 % 11/10/20 1306  Vitals shown include unvalidated device data.  Last Pain:  Vitals:   11/10/20 0950  TempSrc:   PainSc: 0-No pain         Complications: No notable events documented.

## 2020-11-10 NOTE — Anesthesia Procedure Notes (Signed)
Procedure Name: Intubation Date/Time: 11/10/2020 12:20 PM Performed by: Irving Burton, CRNA Pre-anesthesia Checklist: Patient identified, Patient being monitored, Timeout performed, Emergency Drugs available and Suction available Patient Re-evaluated:Patient Re-evaluated prior to induction Oxygen Delivery Method: Circle system utilized Preoxygenation: Pre-oxygenation with 100% oxygen Induction Type: IV induction Ventilation: Mask ventilation without difficulty Laryngoscope Size: 4 and McGraph Grade View: Grade I Tube type: Oral Tube size: 7.5 mm Number of attempts: 1 Airway Equipment and Method: Stylet and Video-laryngoscopy Placement Confirmation: ETT inserted through vocal cords under direct vision, positive ETCO2 and breath sounds checked- equal and bilateral Secured at: 23 cm Tube secured with: Tape Dental Injury: Teeth and Oropharynx as per pre-operative assessment

## 2020-11-10 NOTE — Anesthesia Postprocedure Evaluation (Signed)
Anesthesia Post Note  Patient: Brad Reed  Procedure(s) Performed: ENDOSCOPIC RETROGRADE CHOLANGIOPANCREATOGRAPHY (ERCP)  Patient location during evaluation: PACU Anesthesia Type: General Level of consciousness: awake and alert Pain management: pain level controlled Vital Signs Assessment: post-procedure vital signs reviewed and stable Respiratory status: spontaneous breathing, nonlabored ventilation, respiratory function stable and patient connected to nasal cannula oxygen Cardiovascular status: blood pressure returned to baseline and stable Postop Assessment: no apparent nausea or vomiting Anesthetic complications: no   No notable events documented.   Last Vitals:  Vitals:   11/10/20 1603 11/10/20 1611  BP: (!) 151/88   Pulse: 69   Resp: 20   Temp:  36.8 C  SpO2: 95%     Last Pain:  Vitals:   11/10/20 1611  TempSrc: Oral  PainSc:                  Lenard Simmer

## 2020-11-10 NOTE — TOC Initial Note (Addendum)
[facility]) - Initial/Assessment Note    Patient Details  Name: Brad Reed MRN: [id number] Date of Birth: 03-03-1935  Transition of Care [facility]) CM/SW Contact:    Gildardo Griffes, LCSW Phone Number: 11/10/2020, 9:19 AM  Clinical Narrative:                  CSW spoke with patient's son Spero Geralds regarding discharge planning. He reports patient has been at [facility] IL[facility] however has been having poor decision making skills and appears to need a higher level of care than ILF.   CSW discussed ALF vs SNF, and PT recs of home health. CSW informed of [facility] rates per son's request, CSW will reach out to [facility] to inquire if able to accept, as son reports he is paying similar monthly rates at [facility] however identifies patient needs a higher level of care than independent living at this time as he is often forgetful in taking his meds, cleaning, etc.   Son expressed concerns with patient's ability to make his decisions due ot hx of poor decision making. CSW has informed MD, curious of psych consult warranted. Requested OT consult.   Son provided with Medicaid link for future long term nursing facility placement if needed, reports he will complete.   Referral to [facility] faxed to Rosalie Doctor at [phone]. She reports she will call son to discuss.   Expected Discharge Plan:  (TBD)     Patient Goals and CMS Choice   CMS Medicare.gov Compare Post Acute Care list provided to:: Patient Represenative (must comment) (son Sherrine Maples) Choice offered to / list presented to : Adult Children  Expected Discharge Plan and Services Expected Discharge Plan:  (TBD)     Post Acute Care Choice:  (TBD) Living arrangements for the past 2 months: Independent Living Facility                                      Prior Living Arrangements/Services Living arrangements for the past 2 months: Independent Living Facility Lives with:: Self, Facility Resident Patient  language and need for interpreter reviewed:: Yes        Need for Family Participation in Patient Care: Yes (Comment) Care giver support system in place?: Yes (comment) Current home services: Home PT (through [employer]) Criminal Activity/Legal Involvement Pertinent to Current Situation/Hospitalization: No - Comment as needed  Activities of Daily Living Home Assistive Devices/Equipment: Environmental consultant (specify type) ADL Screening (condition at time of admission) Patient's cognitive ability adequate to safely complete daily activities?: Yes Is the patient deaf or have difficulty hearing?: No Does the patient have difficulty seeing, even when wearing glasses/contacts?: No Does the patient have difficulty concentrating, remembering, or making decisions?: No Patient able to express need for assistance with ADLs?: Yes Does the patient have difficulty dressing or bathing?: No Independently performs ADLs?: Yes (appropriate for developmental age) Does the patient have difficulty walking or climbing stairs?: Yes Weakness of Legs: Both Weakness of Arms/Hands: None  Permission Sought/Granted   Permission granted to share information with : Yes, Verbal Permission Granted  Share Information with NAME: Sherrine Maples  Permission granted to share info w AGENCY: SNF/ALF/HH  Permission granted to share info w Relationship: son  Permission granted to share info w Contact Information: [phone]  Emotional Assessment Appearance:: Appears stated age       Alcohol / Substance Use: Not Applicable Psych Involvement: No (  comment)  Admission diagnosis:  Lactic acid acidosis [E87.2] Transaminitis [R74.01] Severe sepsis (HCC) [A41.9, R65.20] High bilirubin [R17] Leukocytosis, unspecified type [D72.829] Sepsis, due to unspecified organism, unspecified whether acute organ dysfunction present [facility]) [A41.9] Patient Active Problem List   Diagnosis Date Noted   Choledocholithiasis 11/09/2020   Severe sepsis (HCC)  11/08/2020   Transaminitis 11/08/2020   Nausea & vomiting 11/08/2020   Generalized weakness 11/08/2020   Lactic acidosis 11/08/2020   PAD (peripheral artery disease) (HCC) 10/08/2020   Contusion of left hand 07/16/2020   Mood disorder (HCC) 04/06/2020   Osteoarthritis of right knee 04/01/2019   Sensorineural hearing loss (SNHL), bilateral 10/01/2018   Impacted cerumen of both ears 10/01/2018   Memory loss 09/11/2018   Hearing loss 09/11/2018   Urinary incontinence 08/03/2018   Arrhythmia 09/14/2016   Impaired fasting glucose 08/26/2014   Advance directive discussed with patient 08/26/2014   Peripheral neuropathy    Routine general medical examination at a health care facility 08/16/2011   Macular degeneration, wet (HCC)    Obesity, Class III, BMI 40-49.9 (morbid obesity) (HCC) 08/03/2010   BPH with obstruction/lower urinary tract symptoms 12/24/2007   Essential hypertension, benign 12/13/2006   DIVERTICULOSIS, COLON 12/13/2006   PCP:  Karie Schwalbe, MD Pharmacy:   [facility] [id number] [place] Rough, [place] - [address] [place] [address] Phone: [phone] Fax: [phone]  [facility] (Now [facility]) - [place], [place] - [address] [place] [address] Phone: [phone] Fax: [phone]     Social Determinants of Health (SDOH) Interventions    Readmission Risk Interventions No flowsheet data found.

## 2020-11-10 NOTE — Anesthesia Preprocedure Evaluation (Signed)
Anesthesia Evaluation  Patient identified by MRN, date of birth, ID band Patient awake    Reviewed: Allergy & Precautions, H&P , NPO status , Patient's Chart, lab work & pertinent test results, reviewed documented beta blocker date and time   History of Anesthesia Complications Negative for: history of anesthetic complications  Airway Mallampati: III  TM Distance: >3 FB Neck ROM: full    Dental  (+) Missing, Dental Advidsory Given, Poor Dentition   Pulmonary neg shortness of breath, sleep apnea , neg COPD, neg recent URI, former smoker,    Pulmonary exam normal breath sounds clear to auscultation       Cardiovascular Exercise Tolerance: Good hypertension, (-) angina(-) Past MI and (-) Cardiac Stents Normal cardiovascular exam(-) dysrhythmias (-) Valvular Problems/Murmurs Rhythm:regular Rate:Normal     Neuro/Psych PSYCHIATRIC DISORDERS negative neurological ROS     GI/Hepatic negative GI ROS, Neg liver ROS,   Endo/Other  neg diabetesMorbid obesity  Renal/GU negative Renal ROS  negative genitourinary   Musculoskeletal   Abdominal   Peds  Hematology negative hematology ROS (+)   Anesthesia Other Findings Past Medical History: 02/13/2006: Angiodysplasia No date: Arthritis     Comment:  knees No date: Benign prostatic hypertrophy No date: Colon polyps No date: Diverticulosis of colon No date: Hypertension No date: Macular degeneration, wet (HCC)     Comment:  Dr Chyrl [place] No date: Obesity No date: Peripheral neuropathy     Comment:  feet No date: Sleep apnea     Comment:  DX'd when younger. declined CPAP.   Reproductive/Obstetrics negative OB ROS                             Anesthesia Physical Anesthesia Plan  ASA: 3  Anesthesia Plan: General   Post-op Pain Management:    Induction: Intravenous  PONV Risk Score and Plan: 2 and Ondansetron, Dexamethasone and Treatment  may vary due to age or medical condition  Airway Management Planned: Oral ETT  Additional Equipment:   Intra-op Plan:   Post-operative Plan: Extubation in OR  Informed Consent: I have reviewed the patients History and Physical, chart, labs and discussed the procedure including the risks, benefits and alternatives for the proposed anesthesia with the patient or authorized representative who has indicated his/her understanding and acceptance.     Dental Advisory Given  Plan Discussed with: Anesthesiologist, CRNA and Surgeon  Anesthesia Plan Comments:         Anesthesia Quick Evaluation

## 2020-11-11 ENCOUNTER — Inpatient Hospital Stay: Payer: Medicare HMO | Admitting: Anesthesiology

## 2020-11-11 ENCOUNTER — Encounter: Payer: Self-pay | Admitting: Gastroenterology

## 2020-11-11 ENCOUNTER — Encounter
Admission: EM | Disposition: A | Discharge status: Skilled Nursing Facility | Payer: Self-pay | Source: Home / Self Care | Attending: Internal Medicine

## 2020-11-11 LAB — COMPREHENSIVE METABOLIC PANEL
ALT: 154 U/L — ABNORMAL HIGH (ref 0–44)
AST: 62 U/L — ABNORMAL HIGH (ref 15–41)
Albumin: 2.6 g/dL — ABNORMAL LOW (ref 3.5–5.0)
Alkaline Phosphatase: 101 U/L (ref 38–126)
Anion gap: 7 (ref 5–15)
BUN: 18 mg/dL (ref 8–23)
CO2: 30 mmol/L (ref 22–32)
Calcium: 9.1 mg/dL (ref 8.9–10.3)
Chloride: 99 mmol/L (ref 98–111)
Creatinine, Ser: 1.04 mg/dL (ref 0.61–1.24)
GFR, Estimated: >60 mL/min (ref >60)
Glucose, Bld: 181 mg/dL — ABNORMAL HIGH (ref 70–99)
Potassium: 4.1 mmol/L (ref 3.5–5.1)
Sodium: 136 mmol/L (ref 135–145)
Total Bilirubin: 5.5 mg/dL — ABNORMAL HIGH (ref 0.3–1.2)
Total Protein: 5.7 g/dL — ABNORMAL LOW (ref 6.5–8.1)

## 2020-11-11 LAB — CULTURE, BLOOD (SINGLE): Special Requests: ADEQUATE

## 2020-11-11 LAB — CBC WITH DIFFERENTIAL/PLATELET
Abs Immature Granulocytes: 0.04 10*3/uL (ref 0.00–0.07)
Basophils Absolute: 0 10*3/uL (ref 0.0–0.1)
Basophils Relative: 0 %
Eosinophils Absolute: 0 10*3/uL (ref 0.0–0.5)
Eosinophils Relative: 0 %
HCT: 40.9 % (ref 39.0–52.0)
Hemoglobin: 14.3 g/dL (ref 13.0–17.0)
Immature Granulocytes: 1 %
Lymphocytes Relative: 8 %
Lymphs Abs: 0.6 10*3/uL — ABNORMAL LOW (ref 0.7–4.0)
MCH: 33.3 pg (ref 26.0–34.0)
MCHC: 35 g/dL (ref 30.0–36.0)
MCV: 95.1 fL (ref 80.0–100.0)
Monocytes Absolute: 0.5 10*3/uL (ref 0.1–1.0)
Monocytes Relative: 6 %
Neutro Abs: 6.5 10*3/uL (ref 1.7–7.7)
Neutrophils Relative %: 85 %
Platelets: 174 10*3/uL (ref 150–400)
RBC: 4.3 MIL/uL (ref 4.22–5.81)
RDW: 12.3 % (ref 11.5–15.5)
WBC: 7.7 10*3/uL (ref 4.0–10.5)
nRBC: 0 % (ref 0.0–0.2)

## 2020-11-11 SURGERY — CHOLECYSTECTOMY, ROBOT-ASSISTED, LAPAROSCOPIC
Anesthesia: General

## 2020-11-11 MED ORDER — BUPIVACAINE HCL (PF) 0.5 % IJ SOLN
INTRAMUSCULAR | Status: AC
  Filled 2020-11-11: qty 30

## 2020-11-11 MED ORDER — DEXAMETHASONE SODIUM PHOSPHATE 10 MG/ML IJ SOLN
INTRAMUSCULAR | Status: DC | PRN
  Administered 2020-11-11: 5 mg via INTRAVENOUS

## 2020-11-11 MED ORDER — OXYCODONE HCL 5 MG/5ML PO SOLN
5.0000 mg | Freq: Once | ORAL | Status: DC | PRN
Start: 2020-11-11 — End: 2020-11-11

## 2020-11-11 MED ORDER — AMOXICILLIN-POT CLAVULANATE 875-125 MG PO TABS
1.0000 | ORAL_TABLET | Freq: Two times a day (BID) | ORAL | Status: DC
  Administered 2020-11-11 – 2020-11-12 (×2): 1 via ORAL
  Filled 2020-11-11 (×2): qty 1

## 2020-11-11 MED ORDER — SUGAMMADEX SODIUM 200 MG/2ML IV SOLN
INTRAVENOUS | Status: DC | PRN
  Administered 2020-11-11: 260 mg via INTRAVENOUS

## 2020-11-11 MED ORDER — ONDANSETRON HCL 4 MG/2ML IJ SOLN
INTRAMUSCULAR | Status: DC | PRN
  Administered 2020-11-11: 4 mg via INTRAVENOUS

## 2020-11-11 MED ORDER — DEXAMETHASONE SODIUM PHOSPHATE 10 MG/ML IJ SOLN
INTRAMUSCULAR | Status: AC
  Filled 2020-11-11: qty 1

## 2020-11-11 MED ORDER — HYDROCODONE-ACETAMINOPHEN 5-325 MG PO TABS
1.0000 | ORAL_TABLET | ORAL | Status: DC | PRN
  Administered 2020-11-14: 2 via ORAL
  Administered 2020-11-15: 1 via ORAL
  Administered 2020-11-15: 2 via ORAL
  Filled 2020-11-11: qty 1
  Filled 2020-11-11 (×2): qty 2

## 2020-11-11 MED ORDER — FENTANYL CITRATE (PF) 100 MCG/2ML IJ SOLN
INTRAMUSCULAR | Status: AC
  Filled 2020-11-11: qty 2

## 2020-11-11 MED ORDER — PROPOFOL 10 MG/ML IV BOLUS
INTRAVENOUS | Status: DC | PRN
  Administered 2020-11-11: 120 mg via INTRAVENOUS

## 2020-11-11 MED ORDER — TRAMADOL HCL 50 MG PO TABS
50.0000 mg | ORAL_TABLET | Freq: Four times a day (QID) | ORAL | Status: DC | PRN
Start: 2020-11-11 — End: 2020-11-19

## 2020-11-11 MED ORDER — LIDOCAINE HCL (CARDIAC) PF 100 MG/5ML IV SOSY
PREFILLED_SYRINGE | INTRAVENOUS | Status: DC | PRN
  Administered 2020-11-11: 100 mg via INTRAVENOUS

## 2020-11-11 MED ORDER — FENTANYL CITRATE (PF) 100 MCG/2ML IJ SOLN
INTRAMUSCULAR | Status: DC | PRN
  Administered 2020-11-11: 50 ug via INTRAVENOUS

## 2020-11-11 MED ORDER — ENOXAPARIN SODIUM 60 MG/0.6ML IJ SOSY
0.5000 mg/kg | PREFILLED_SYRINGE | INTRAMUSCULAR | Status: DC
  Filled 2020-11-11: qty 0.63

## 2020-11-11 MED ORDER — LIDOCAINE HCL (PF) 2 % IJ SOLN
INTRAMUSCULAR | Status: AC
  Filled 2020-11-11: qty 5

## 2020-11-11 MED ORDER — OXYCODONE HCL 5 MG PO TABS: 5.0000 mg | ORAL_TABLET | Freq: Once | ORAL | Status: DC | PRN

## 2020-11-11 MED ORDER — ROCURONIUM BROMIDE 100 MG/10ML IV SOLN
INTRAVENOUS | Status: DC | PRN
  Administered 2020-11-11: 20 mg via INTRAVENOUS
  Administered 2020-11-11: 40 mg via INTRAVENOUS

## 2020-11-11 MED ORDER — SODIUM CHLORIDE 0.9 % IR SOLN
Status: DC | PRN
  Administered 2020-11-11: 1000 mL

## 2020-11-11 MED ORDER — SODIUM CHLORIDE 0.9 % IV SOLN: INTRAVENOUS | Status: DC | PRN

## 2020-11-11 MED ORDER — INDOCYANINE GREEN 25 MG IV SOLR
1.2500 mg | INTRAVENOUS | Status: AC
  Administered 2020-11-11: 1.25 mg via INTRAVENOUS
  Filled 2020-11-11: qty 0.5

## 2020-11-11 MED ORDER — PROPOFOL 10 MG/ML IV BOLUS
INTRAVENOUS | Status: AC
  Filled 2020-11-11: qty 20

## 2020-11-11 MED ORDER — SUCCINYLCHOLINE CHLORIDE 20 MG/ML IJ SOLN
INTRAMUSCULAR | Status: DC | PRN
  Administered 2020-11-11: 120 mg via INTRAVENOUS

## 2020-11-11 MED ORDER — SODIUM CHLORIDE 0.9 % IV SOLN: INTRAVENOUS | Status: DC

## 2020-11-11 MED ORDER — LIDOCAINE-EPINEPHRINE 1 %-1:100000 IJ SOLN
INTRAMUSCULAR | Status: AC
  Filled 2020-11-11: qty 1

## 2020-11-11 MED ORDER — MORPHINE SULFATE (PF) 2 MG/ML IV SOLN: 1.0000 mg | INTRAVENOUS | Status: DC | PRN

## 2020-11-11 MED ORDER — SUCCINYLCHOLINE CHLORIDE 200 MG/10ML IV SOSY
PREFILLED_SYRINGE | INTRAVENOUS | Status: AC
  Filled 2020-11-11: qty 10

## 2020-11-11 MED ORDER — FENTANYL CITRATE (PF) 100 MCG/2ML IJ SOLN
25.0000 ug | INTRAMUSCULAR | Status: DC | PRN
  Administered 2020-11-11: 25 ug via INTRAVENOUS

## 2020-11-11 MED ORDER — ENOXAPARIN SODIUM 80 MG/0.8ML IJ SOSY
0.5000 mg/kg | PREFILLED_SYRINGE | INTRAMUSCULAR | Status: DC
  Administered 2020-11-12 – 2020-11-19 (×8): 62.5 mg via SUBCUTANEOUS
  Filled 2020-11-11 (×8): qty 0.8

## 2020-11-11 MED ORDER — ONDANSETRON HCL 4 MG/2ML IJ SOLN
INTRAMUSCULAR | Status: AC
  Filled 2020-11-11: qty 2

## 2020-11-11 MED ORDER — LIDOCAINE-EPINEPHRINE (PF) 1 %-1:200000 IJ SOLN
INTRAMUSCULAR | Status: DC | PRN
  Administered 2020-11-11: 17 mL via INTRAMUSCULAR

## 2020-11-11 MED ORDER — ROCURONIUM BROMIDE 10 MG/ML (PF) SYRINGE
PREFILLED_SYRINGE | INTRAVENOUS | Status: AC
  Filled 2020-11-11: qty 10

## 2020-11-11 MED ORDER — FENTANYL CITRATE (PF) 100 MCG/2ML IJ SOLN
INTRAMUSCULAR | Status: AC
  Administered 2020-11-11: 25 ug via INTRAVENOUS
  Filled 2020-11-11: qty 2

## 2020-11-11 SURGICAL SUPPLY — 66 items
ADH SKN CLS APL DERMABOND .7 (GAUZE/BANDAGES/DRESSINGS) ×1
ANCHOR TIS RET SYS 235ML (MISCELLANEOUS) ×2 IMPLANT
APL PRP STRL LF DISP 70% ISPRP (MISCELLANEOUS) ×1
BAG INFUSER PRESSURE 100CC (MISCELLANEOUS) ×1 IMPLANT
BAG TISS RTRVL C235 10X14 (MISCELLANEOUS) ×1
BLADE SURG SZ11 CARB STEEL (BLADE) ×2 IMPLANT
BULB RESERV EVAC DRAIN JP 100C (MISCELLANEOUS) ×1 IMPLANT
CANISTER SUCT 1200ML W/VALVE (MISCELLANEOUS) ×2 IMPLANT
CANNULA REDUC XI 12-8 STAPL (CANNULA) ×2
CANNULA REDUCER 12-8 DVNC XI (CANNULA) ×1 IMPLANT
CATH REDDICK CHOLANGI 4FR 50CM (CATHETERS) IMPLANT
CHLORAPREP W/TINT 26 (MISCELLANEOUS) ×2 IMPLANT
CLIP VESOLOCK MED LG 6/CT (CLIP) ×2 IMPLANT
COVER TIP SHEARS 8 DVNC (MISCELLANEOUS) IMPLANT
COVER TIP SHEARS 8MM DA VINCI (MISCELLANEOUS) ×2
DECANTER SPIKE VIAL GLASS SM (MISCELLANEOUS) ×4 IMPLANT
DEFOGGER SCOPE WARMER CLEARIFY (MISCELLANEOUS) ×2 IMPLANT
DERMABOND ADVANCED (GAUZE/BANDAGES/DRESSINGS) ×1
DERMABOND ADVANCED .7 DNX12 (GAUZE/BANDAGES/DRESSINGS) ×1 IMPLANT
DRAIN CHANNEL JP 15F RND 16 (MISCELLANEOUS) ×1 IMPLANT
DRAPE ARM DVNC X/XI (DISPOSABLE) ×4 IMPLANT
DRAPE C-ARM XRAY 36X54 (DRAPES) IMPLANT
DRAPE COLUMN DVNC XI (DISPOSABLE) ×1 IMPLANT
DRAPE DA VINCI XI ARM (DISPOSABLE) ×8
DRAPE DA VINCI XI COLUMN (DISPOSABLE) ×2
DRSG TEGADERM 4X4.75 (GAUZE/BANDAGES/DRESSINGS) ×1 IMPLANT
ELECT CAUTERY BLADE 6.4 (BLADE) ×2 IMPLANT
ELECT REM PT RETURN 9FT ADLT (ELECTROSURGICAL) ×2
ELECTRODE REM PT RTRN 9FT ADLT (ELECTROSURGICAL) ×1 IMPLANT
GAUZE 4X4 16PLY ~~LOC~~+RFID DBL (SPONGE) ×2 IMPLANT
GLOVE SURG SYN 6.5 ES PF (GLOVE) ×4 IMPLANT
GLOVE SURG SYN 6.5 PF PI (GLOVE) ×2 IMPLANT
GLOVE SURG UNDER POLY LF SZ7 (GLOVE) ×4 IMPLANT
GOWN STRL REUS W/ TWL LRG LVL3 (GOWN DISPOSABLE) ×3 IMPLANT
GOWN STRL REUS W/TWL LRG LVL3 (GOWN DISPOSABLE) ×6
GRASPER SUT TROCAR 14GX15 (MISCELLANEOUS) IMPLANT
IRRIGATOR SUCT 8 DISP DVNC XI (IRRIGATION / IRRIGATOR) IMPLANT
IRRIGATOR SUCTION 8MM XI DISP (IRRIGATION / IRRIGATOR) ×2
IV NS 1000ML (IV SOLUTION) ×2
IV NS 1000ML BAXH (IV SOLUTION) IMPLANT
LABEL OR SOLS (LABEL) ×2 IMPLANT
MANIFOLD NEPTUNE II (INSTRUMENTS) ×2 IMPLANT
NDL INSUFFLATION 14GA 120MM (NEEDLE) ×1 IMPLANT
NEEDLE HYPO 22GX1.5 SAFETY (NEEDLE) ×2 IMPLANT
NEEDLE INSUFFLATION 14GA 120MM (NEEDLE) ×2 IMPLANT
NS IRRIG 500ML POUR BTL (IV SOLUTION) ×2 IMPLANT
OBTURATOR OPTICAL STANDARD 8MM (TROCAR) ×2
OBTURATOR OPTICAL STND 8 DVNC (TROCAR) ×1
OBTURATOR OPTICALSTD 8 DVNC (TROCAR) ×1 IMPLANT
PACK LAP CHOLECYSTECTOMY (MISCELLANEOUS) ×2 IMPLANT
PENCIL ELECTRO HAND CTR (MISCELLANEOUS) ×2 IMPLANT
SEAL CANN UNIV 5-8 DVNC XI (MISCELLANEOUS) ×3 IMPLANT
SEAL XI 5MM-8MM UNIVERSAL (MISCELLANEOUS) ×6
SEALER VESSEL DA VINCI XI (MISCELLANEOUS) ×2
SEALER VESSEL EXT DVNC XI (MISCELLANEOUS) IMPLANT
SET TUBE SMOKE EVAC HIGH FLOW (TUBING) ×2 IMPLANT
SOLUTION ELECTROLUBE (MISCELLANEOUS) ×2 IMPLANT
SPONGE DRAIN TRACH 4X4 STRL 2S (GAUZE/BANDAGES/DRESSINGS) ×1 IMPLANT
STAPLER CANNULA SEAL DVNC XI (STAPLE) ×1 IMPLANT
STAPLER CANNULA SEAL XI (STAPLE) ×2
SUT MNCRL 4-0 (SUTURE) ×4
SUT MNCRL 4-0 27XMFL (SUTURE) ×2
SUT SILK 3 0 SH 30 (SUTURE) ×1 IMPLANT
SUT VICRYL 0 AB UR-6 (SUTURE) ×2 IMPLANT
SUTURE MNCRL 4-0 27XMF (SUTURE) ×2 IMPLANT
SYR 30ML LL (SYRINGE) IMPLANT

## 2020-11-11 NOTE — Progress Notes (Signed)
PROGRESS NOTE    Brad Reed  LPF:[id number] DOB: 16-Oct-1934 DOA: 11/08/2020 PCP: Karie Schwalbe, MD   Chief complaint.  Nausea vomiting. Brief Narrative:  Brad Reed is a 86 y.o. male with medical history significant for essential hypertension, diverticulosis, who is admitted to [facility] on 11/08/2020 with severe sepsis in the setting of acute transaminitis after presenting from home to [facility] ED complaining of nausea/vomiting.  Patient states that that he had a liver abscess a year ago which has since resolved. Upon arrival to emergency room, patient had a significant jaundice with a bilirubin of 8.4, MRCP showed stone in the common bile duct.  Patient has been evaluated by GI, successful ERCP on 6/28.   Assessment & Plan:   Principal Problem:   Severe sepsis (HCC) Active Problems:   Essential hypertension, benign   Transaminitis   Nausea & vomiting   Generalized weakness   Lactic acidosis   Choledocholithiasis  #1.  Severe sepsis. Choledocholithiasis Initial blood culture positive for Staphylococcus epidermis in 1 bottle, repeat culture was negative.  Most likely this is a skin flora. Patient condition has been improving after successful ERCP.  I will change antibiotics to Augmentin for 5 days. General surgery for possible cholecystectomy.  2.  Generalized weakness. Continue PT/OT. Patient most likely will be discharged to assisted living facility.  3.  Chronic alcohol abuse. Hypokalemia Patient has no evidence of alcohol withdrawal.  Continue thiamine and folic acid.  Continue as needed Ativan. Potassium has normalized     DVT prophylaxis: SCDs Code Status: DNR Family Communication:  Disposition Plan:    Status is: Inpatient  Remains inpatient appropriate because:Inpatient level of care appropriate due to severity of illness  Dispo: The patient is from: Home              Anticipated d/c is to: ALF              Patient currently  is not medically stable to d/c.   Difficult to place patient No        I/O last 3 completed shifts: In: 1149.2 [P.O.:120; I.V.:729.1; IV Piggyback:300.1] Out: 200 [Urine:200] No intake/output data recorded.     Consultants:  GI, GS  Procedures: ERCP  Antimicrobials: Augmentin.  Subjective: Patient feels much better today, no abdominal discomfort or pain.  No nausea vomiting.  Good appetite this morning. Denies any short of breath or cough. No fever or chills. No dysuria hematuria. No headache or dizziness. No chest pain palpitation.  Objective: Vitals:   11/10/20 2344 11/11/20 0345 11/11/20 0527 11/11/20 1200  BP: 128/70 (!) 148/77  (!) 153/82  Pulse:  (!) 54  65  Resp:      Temp: 97.8 F (36.6 C) 97.8 F (36.6 C)  97.6 F (36.4 C)  TempSrc: Oral Oral  Oral  SpO2: 93% 97%  91%  Weight:   124.2 kg   Height:        Intake/Output Summary (Last 24 hours) at 11/11/2020 1220 Last data filed at 11/11/2020 0400 Gross per 24 hour  Intake 776.9 ml  Output 200 ml  Net 576.9 ml   Filed Weights   11/08/20 1804 11/10/20 0430 11/11/20 0527  Weight: 125 kg 121.6 kg 124.2 kg    Examination:  General exam: Appears calm and comfortable  Respiratory system: Clear to auscultation. Respiratory effort normal. Cardiovascular system: S1 & S2 heard, RRR. No JVD, murmurs, rubs, gallops or clicks. No pedal edema. Gastrointestinal system: Abdomen is  nondistended, soft and nontender. No organomegaly or masses felt. Normal bowel sounds heard. Central nervous system: Alert and oriented x3. No focal neurological deficits. Extremities: Symmetric 5 x 5 power. Skin: No rashes, lesions or ulcers Psychiatry: Judgement and insight appear normal. Mood & affect appropriate.     Data Reviewed: I have personally reviewed following labs and imaging studies  CBC: Recent Labs  Lab 11/08/20 1810 11/09/20 0702 11/10/20 0650 11/11/20 0705  WBC 12.6* 13.5* 10.5 7.7  NEUTROABS 11.3* 11.1*  8.5* 6.5  HGB 15.2 14.7 14.1 14.3  HCT 43.9 41.6 40.5 40.9  MCV 96.5 94.5 95.3 95.1  PLT 171 145* 146* 174   Basic Metabolic Panel: Recent Labs  Lab 11/08/20 1810 11/09/20 0702 11/10/20 0650 11/11/20 0705  NA 137 139 139 136  K 3.5 3.0* 3.2* 4.1  CL 102 104 102 99  CO2 27 28 28 30   GLUCOSE 128* 113* 96 181*  BUN 14 14 16 18   CREATININE 1.04 0.74 0.77 1.04  CALCIUM 8.9 8.6* 8.5* 9.1  MG 1.8 2.0  --   --   PHOS  --  3.1  --   --    GFR: Estimated Creatinine Clearance: 69.9 mL/min (by C-G formula based on SCr of 1.04 mg/dL). Liver Function Tests: Recent Labs  Lab 11/08/20 1810 11/09/20 0702 11/10/20 0650 11/11/20 0705  AST 784* 367* 136* 62*  ALT 476* 376* 227* 154*  ALKPHOS 132* 112 108 101  BILITOT 4.2* 8.1* 9.4* 5.5*  PROT 6.4* 5.9* 5.6* 5.7*  ALBUMIN 3.2* 3.0* 2.8* 2.6*   Recent Labs  Lab 11/08/20 1810  LIPASE 29   No results for input(s): AMMONIA in the last 168 hours. Coagulation Profile: Recent Labs  Lab 11/08/20 1810 11/09/20 0702  INR 1.2 1.3*   Cardiac Enzymes: No results for input(s): CKTOTAL, CKMB, CKMBINDEX, TROPONINI in the last 168 hours. BNP (last 3 results) No results for input(s): PROBNP in the last 8760 hours. HbA1C: No results for input(s): HGBA1C in the last 72 hours. CBG: No results for input(s): GLUCAP in the last 168 hours. Lipid Profile: No results for input(s): CHOL, HDL, LDLCALC, TRIG, CHOLHDL, LDLDIRECT in the last 72 hours. Thyroid Function Tests: Recent Labs    11/08/20 1810  TSH 2.882   Anemia Panel: No results for input(s): VITAMINB12, FOLATE, FERRITIN, TIBC, IRON, RETICCTPCT in the last 72 hours. Sepsis Labs: Recent Labs  Lab 11/08/20 1810 11/08/20 1848 11/08/20 2104  PROCALCITON 0.88  --   --   LATICACIDVEN  --  2.3* 1.8    Recent Results (from the past 240 hour(s))  Resp Panel by RT-PCR (Flu A&B, Covid) Nasopharyngeal Swab     Status: None   Collection Time: 11/08/20  6:17 PM   Specimen: Nasopharyngeal  Swab; Nasopharyngeal(NP) swabs in vial transport medium  Result Value Ref Range Status   SARS Coronavirus 2 by RT PCR NEGATIVE NEGATIVE Final    Comment: (NOTE) SARS-CoV-2 target nucleic acids are NOT DETECTED.  The SARS-CoV-2 RNA is generally detectable in upper respiratory specimens during the acute phase of infection. The lowest concentration of SARS-CoV-2 viral copies this assay can detect is 138 copies/mL. A negative result does not preclude SARS-Cov-2 infection and should not be used as the sole basis for treatment or other patient management decisions. A negative result may occur with  improper specimen collection/handling, submission of specimen other than nasopharyngeal swab, presence of viral mutation(s) within the areas targeted by this assay, and inadequate number of viral copies(<138 copies/mL). A  negative result must be combined with clinical observations, patient history, and epidemiological information. The expected result is Negative.  Fact Sheet for Patients:  BloggerCourse.com  Fact Sheet for Healthcare Providers:  SeriousBroker.it  This test is no t yet approved or cleared by the [place] FDA and  has been authorized for detection and/or diagnosis of SARS-CoV-2 by FDA under an Emergency Use Authorization (EUA). This EUA will remain  in effect (meaning this test can be used) for the duration of the COVID-19 declaration under Section 564(b)(1) of the Act, 21 U.S.C.section 360bbb-3(b)(1), unless the authorization is terminated  or revoked sooner.       Influenza A by PCR NEGATIVE NEGATIVE Final   Influenza B by PCR NEGATIVE NEGATIVE Final    Comment: (NOTE) The Xpert Xpress SARS-CoV-2/FLU/RSV plus assay is intended as an aid in the diagnosis of influenza from Nasopharyngeal swab specimens and should not be used as a sole basis for treatment. Nasal washings and aspirates are unacceptable for Xpert Xpress  SARS-CoV-2/FLU/RSV testing.  Fact Sheet for Patients: BloggerCourse.com  Fact Sheet for Healthcare Providers: SeriousBroker.it  This test is not yet approved or cleared by the [place] FDA and has been authorized for detection and/or diagnosis of SARS-CoV-2 by FDA under an Emergency Use Authorization (EUA). This EUA will remain in effect (meaning this test can be used) for the duration of the COVID-19 declaration under Section 564(b)(1) of the Act, 21 U.S.C. section 360bbb-3(b)(1), unless the authorization is terminated or revoked.  Performed at [facility], [address] [place], [place] [address]   Blood culture (routine single)     Status: Abnormal   Collection Time: 11/08/20  6:48 PM   Specimen: BLOOD  Result Value Ref Range Status   Specimen Description   Final    BLOOD  RT [facility] Performed at [facility], [address], [place], [place] [address]    Special Requests   Final    BOTTLES DRAWN AEROBIC AND ANAEROBIC Blood Culture adequate volume Performed at [facility], [address] [place], [place] [address]    Culture  Setup Time   Final    IN BOTH AEROBIC AND ANAEROBIC BOTTLES GRAM POSITIVE COCCI CRITICAL RESULT CALLED TO, READ BACK BY AND VERIFIED WITH: MORGAN HICKS 11/09/20 1255 KLW    Culture (A)  Final    STAPHYLOCOCCUS EPIDERMIDIS THE SIGNIFICANCE OF ISOLATING THIS ORGANISM FROM A SINGLE SET OF BLOOD CULTURES WHEN MULTIPLE SETS ARE DRAWN IS UNCERTAIN. PLEASE NOTIFY THE MICROBIOLOGY DEPARTMENT WITHIN ONE WEEK IF SPECIATION AND SENSITIVITIES ARE REQUIRED. Performed at [facility], [address], [place], [place] [address]    Report Status 11/11/2020 FINAL  Final  Blood Culture ID Panel (Reflexed)     Status: Abnormal   Collection Time: 11/08/20  6:48 PM  Result Value Ref Range Status   Enterococcus faecalis NOT DETECTED NOT DETECTED Final   Enterococcus Faecium NOT  DETECTED NOT DETECTED Final   Listeria monocytogenes NOT DETECTED NOT DETECTED Final   Staphylococcus species DETECTED (A) NOT DETECTED Final    Comment: CRITICAL RESULT CALLED TO, READ BACK BY AND VERIFIED WITH: MORGAN HICKS 11/09/20 1255 KLW    Staphylococcus aureus (BCID) NOT DETECTED NOT DETECTED Final   Staphylococcus epidermidis DETECTED (A) NOT DETECTED Final    Comment: Methicillin (oxacillin) resistant coagulase negative staphylococcus. Possible blood culture contaminant (unless isolated from more than one blood culture draw or clinical case suggests pathogenicity). No antibiotic treatment is indicated for blood  culture contaminants. CRITICAL RESULT CALLED TO, READ BACK BY AND VERIFIED WITH: MORGAN HICKS 11/09/20 1255 KLW    Staphylococcus lugdunensis NOT DETECTED NOT DETECTED Final   Streptococcus species NOT DETECTED NOT DETECTED Final   Streptococcus agalactiae NOT DETECTED NOT DETECTED Final   Streptococcus pneumoniae NOT DETECTED NOT DETECTED Final   Streptococcus pyogenes NOT DETECTED NOT DETECTED Final   A.calcoaceticus-baumannii NOT DETECTED NOT DETECTED Final   Bacteroides fragilis NOT DETECTED NOT DETECTED Final   Enterobacterales NOT DETECTED NOT DETECTED Final   Enterobacter cloacae complex NOT DETECTED NOT DETECTED Final   Escherichia coli NOT DETECTED NOT DETECTED Final   Klebsiella aerogenes NOT DETECTED NOT DETECTED Final   Klebsiella oxytoca NOT DETECTED NOT DETECTED Final   Klebsiella pneumoniae NOT DETECTED NOT DETECTED Final   Proteus species NOT DETECTED NOT DETECTED Final   Salmonella species NOT DETECTED NOT DETECTED Final   Serratia marcescens NOT DETECTED NOT DETECTED Final   Haemophilus influenzae NOT DETECTED NOT DETECTED Final   Neisseria meningitidis NOT DETECTED NOT DETECTED Final   Pseudomonas aeruginosa NOT DETECTED NOT DETECTED Final   Stenotrophomonas maltophilia NOT DETECTED NOT DETECTED Final   Candida albicans NOT DETECTED NOT DETECTED  Final   Candida auris NOT DETECTED NOT DETECTED Final   Candida glabrata NOT DETECTED NOT DETECTED Final   Candida krusei NOT DETECTED NOT DETECTED Final   Candida parapsilosis NOT DETECTED NOT DETECTED Final   Candida tropicalis NOT DETECTED NOT DETECTED Final   Cryptococcus neoformans/gattii NOT DETECTED NOT DETECTED Final   Methicillin resistance mecA/C DETECTED (A) NOT DETECTED Final    Comment: CRITICAL RESULT CALLED TO, READ BACK BY AND VERIFIED WITH: MORGAN HICKS 11/09/20 1255 KLW Performed at [facility], [address] [place], [place] [address]   Urine culture     Status: Abnormal   Collection Time: 11/08/20  9:53 PM   Specimen: In/Out Cath Urine  Result Value Ref Range Status   Specimen Description   Final    IN/OUT CATH URINE Performed at [facility], [address] [place], [place] [address]    Special Requests   Final    NONE Performed at [facility], [address] [place], [place] [address]    Culture MULTIPLE SPECIES PRESENT, SUGGEST RECOLLECTION (A)  Final   Report Status 11/10/2020 FINAL  Final  CULTURE, BLOOD (ROUTINE X 2) w Reflex to ID Panel     Status: None (Preliminary result)   Collection Time: 11/09/20  2:22 PM   Specimen: BLOOD RIGHT HAND  Result Value Ref Range Status   Specimen Description BLOOD RIGHT HAND  Final   Special Requests   Final    BOTTLES DRAWN AEROBIC AND ANAEROBIC Blood Culture results may not be optimal due to an inadequate volume of blood received in culture bottles   Culture   Final    NO GROWTH 2 DAYS Performed at [facility], [address] [place], [place] [address]    Report Status PENDING  Incomplete  CULTURE, BLOOD (ROUTINE X 2) w Reflex to ID Panel     Status: None (Preliminary result)   Collection Time: 11/09/20  2:22 PM   Specimen: BLOOD LEFT HAND  Result Value Ref Range Status   Specimen Description BLOOD LEFT HAND  Final   Special Requests   Final    BOTTLES DRAWN  AEROBIC AND ANAEROBIC Blood Culture results may not be optimal due to an inadequate volume of blood received in culture bottles   Culture  Final    NO GROWTH 2 DAYS Performed at [facility], [address] [place], [place] [id number]    Report Status PENDING  Incomplete         Radiology Studies: DG C-Arm 1-60 Min-No Report  Result Date: 11/10/2020 Fluoroscopy was utilized by the requesting physician.  No radiographic interpretation.        Scheduled Meds:  amoxicillin-clavulanate  1 tablet Oral Q12H   thiamine injection  100 mg Intravenous Daily   Continuous Infusions:  piperacillin-tazobactam (ZOSYN)  IV 3.375 g (11/11/20 0522)     LOS: 3 days    Time spent: 28 minutes    Marrion Coyekui Katie Faraone, MD [facility]   To contact the attending provider between 7A-7P or the covering provider during after hours 7P-7A, please log into the web site [email or web address] and access using universal [place] password for that web site. If you do not have the password, please call the hospital operator.  11/11/2020, 12:20 PM

## 2020-11-11 NOTE — Anesthesia Preprocedure Evaluation (Signed)
Anesthesia Evaluation  Patient identified by MRN, date of birth, ID band Patient awake    Reviewed: Allergy & Precautions, NPO status , Patient's Chart, lab work & pertinent test results  History of Anesthesia Complications Negative for: history of anesthetic complications  Airway Mallampati: III  TM Distance: <3 FB Neck ROM: limited    Dental  (+) Chipped, Poor Dentition, Missing   Pulmonary sleep apnea , former smoker,    Pulmonary exam normal        Cardiovascular Exercise Tolerance: Good hypertension, + Peripheral Vascular Disease  (-) Past MI Normal cardiovascular exam     Neuro/Psych PSYCHIATRIC DISORDERS  Neuromuscular disease negative psych ROS   GI/Hepatic negative GI ROS, Neg liver ROS, neg GERD  ,  Endo/Other  negative endocrine ROS  Renal/GU      Musculoskeletal   Abdominal   Peds  Hematology negative hematology ROS (+)   Anesthesia Other Findings Past Medical History: 02/13/2006: Angiodysplasia No date: Arthritis     Comment:  knees No date: Benign prostatic hypertrophy No date: Colon polyps No date: Diverticulosis of colon No date: Hypertension No date: Macular degeneration, wet (HCC)     Comment:  Dr Chyrl [place]atte No date: Obesity No date: Peripheral neuropathy     Comment:  feet No date: Sleep apnea     Comment:  DX'd when younger. declined CPAP.  Past Surgical History: No date: APPENDECTOMY 03/05/2018: CATARACT EXTRACTION W/PHACO; Left     Comment:  Procedure: CATARACT EXTRACTION PHACO AND INTRAOCULAR               LENS PLACEMENT (IOC) LEFT;  Surgeon: Nevada [place],               MD;  Location: [facility];  Service:               Ophthalmology;  Laterality: Left;  sleep apnea 03/19/2018: CATARACT EXTRACTION W/PHACO; Right     Comment:  Procedure: CATARACT EXTRACTION PHACO AND INTRAOCULAR               LENS PLACEMENT (IOC);  Surgeon: Nevada [place], MD;                 Location: [facility];  Service: Ophthalmology;                Laterality: Right; 11/10/2020: ERCP; N/A     Comment:  Procedure: ENDOSCOPIC RETROGRADE               CHOLANGIOPANCREATOGRAPHY (ERCP);  Surgeon: Midge Minium,               MD;  Location: [facility];  Service: Endoscopy;                Laterality: N/A; 07/03/2018: IR GUIDED DRAIN W CATHETER PLACEMENT 07/18/2018: IR RADIOLOGIST EVAL & MGMT No date: KNEE ARTHROSCOPY     Comment:  BOTH KNEES No date: MASTOID DEBRIDEMENT 1960: PILONIDAL CYST EXCISION No date: TONSILLECTOMY  BMI    Body Mass Index: 39.29 kg/m      Reproductive/Obstetrics negative OB ROS                             Anesthesia Physical Anesthesia Plan  ASA: 3  Anesthesia Plan: General ETT   Post-op Pain Management:    Induction: Intravenous  PONV Risk Score and Plan: Ondansetron, Dexamethasone, Midazolam and Treatment may vary due to age or medical condition  Airway Management Planned: Oral ETT  Additional Equipment:   Intra-op Plan:   Post-operative Plan: Extubation in OR  Informed Consent: I have reviewed the patients History and Physical, chart, labs and discussed the procedure including the risks, benefits and alternatives for the proposed anesthesia with the patient or authorized representative who has indicated his/her understanding and acceptance.   Patient has DNR.  Suspend DNR and Discussed DNR with patient.   Dental Advisory Given  Plan Discussed with: Anesthesiologist, CRNA and Surgeon  Anesthesia Plan Comments: (Patient consented for risks of anesthesia including but not limited to:  - adverse reactions to medications - damage to eyes, teeth, lips or other oral mucosa - nerve damage due to positioning  - sore throat or hoarseness - Damage to heart, brain, nerves, lungs, other parts of body or loss of life  Patient voiced understanding.)        Anesthesia Quick Evaluation

## 2020-11-11 NOTE — Plan of Care (Signed)
  Problem: Clinical Measurements: Goal: Ability to maintain clinical measurements within normal limits will improve Outcome: Progressing   Problem: Clinical Measurements: Goal: Will remain free from infection Outcome: Progressing   Problem: Clinical Measurements: Goal: Diagnostic test results will improve Outcome: Progressing   

## 2020-11-11 NOTE — Evaluation (Signed)
Occupational Therapy Evaluation Patient Details Name: Brad Reed MRN: [id number] DOB: 03-23-1935 Today's Date: 11/11/2020    History of Present Illness Brad Reed is an 86yoM admitted on 11/08/20 with c/c of N&V. Pt being treated for severe sepsis. Imaging revealed choledocholithiasis & cholelithiasis. PMH: HTN, diverticulosis, knee arthritis, macular degeneration, obesity, peripheral neuropathy, sleep apnea. Pt underwent ERCP on 6/28 with continuation of orders.   Clinical Impression   Brad Reed was seen for OT evaluation this date. Prior to hospital admission, pt was MOD I for mobility and ADLs. Pt lives at [facility] ILF with his sister who provides meals. Pt presents to acute OT demonstrating impaired ADL performance and functional mobility 2/2 decreased activity tolerance, functional strength/balance deficits, and poor insight into deficits. Pt currently requires MIN A for LB access seated EOC. SETUP perihygiene with lateral leans - pt reports using bidet at baseline. SBA + RW for ADL t/f.   Pt completed the SLUMS, a 30-point screening questionnaire that tests orientation, memory, attention, problem solving, and executive function. 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 This indicates the need for further assessment. Pt with noted impairments in short term memory limiting ability to participate functionally in medication mgmt. Pt completed Pill Box Test with a failing score. The Pill Box test assesses a pt's ability to accurately follow common medication bottle instructions to fill a 1 week pill box. It assesses a pt's ability to plan, self-monitor, volition, and executive function. Pt demonstrated deficits in self-monitoring. Of note, pt reports requiring glasses to read but did not have in room therefore OT read instruction labels.   Pt would benefit from skilled OT to address noted impairments and functional limitations (see below for any additional details) in order to  maximize safety and independence while minimizing falls risk and caregiver burden. Upon hospital discharge, recommend HHOT to maximize pt safety and return to functional independence during meaningful occupations of daily life.     Follow Up Recommendations  Home health OT;Supervision - Intermittent    Equipment Recommendations  3 in 1 bedside commode    Recommendations for Other Services       Precautions / Restrictions Precautions Precautions: Fall Restrictions Weight Bearing Restrictions: No      Mobility Bed Mobility Overal bed mobility: Modified Independent             General bed mobility comments: pt received and left in chair    Transfers Overall transfer level: Needs assistance Equipment used: Rolling walker (2 wheeled) Transfers: Sit to/from Stand Sit to Stand: Min guard;From elevated surface (SBA)         General transfer comment: suopervision with grab bar/toilet, arm rests/recliner; needs elevated EOB to rise with RW    Balance Overall balance assessment: Needs assistance Sitting-balance support: Bilateral upper extremity supported;Feet supported Sitting balance-Leahy Scale: Good     Standing balance support: No upper extremity supported;During functional activity Standing balance-Leahy Scale: Fair                             ADL either performed or assessed with clinical judgement   ADL Overall ADL's : Needs assistance/impaired                                       General ADL Comments: MIN A for LB access seated EOC. SETUP perihygiene with lateral leans -  pt reports using bidet at baseline. SBA + RW for ADL t/f.      Pertinent Vitals/Pain Pain Assessment: No/denies pain     Hand Dominance     Extremity/Trunk Assessment Upper Extremity Assessment Upper Extremity Assessment: Generalized weakness   Lower Extremity Assessment Lower Extremity Assessment: Generalized weakness       Communication  Communication Communication: No difficulties   Cognition Arousal/Alertness: Awake/alert Behavior During Therapy: WFL for tasks assessed/performed Overall Cognitive Status: Within Functional Limits for tasks assessed                                        Exercises Exercises: Other exercises Other Exercises Other Exercises: Pt educated re: OT role, DME recs, d/c recs, falls prevention, ECS Other Exercises: LBD, toileting, sit<>stnd x2, sitting/standing balance/tolerance, pill box test, SLUMS   Shoulder Instructions      Home Living Family/patient expects to be discharged to::  (ILF at [facility]) Living Arrangements: Other relatives                               Additional Comments: reports living with his sister      Prior Functioning/Environment Level of Independence: Independent with assistive device(s)        Comments: mod I with rollator in home, RW in community, denies falls, lives with sister who assists with meals        OT Problem List: Decreased strength;Decreased range of motion;Decreased activity tolerance;Impaired balance (sitting and/or standing);Decreased safety awareness;Decreased knowledge of use of DME or AE      OT Treatment/Interventions: Self-care/ADL training;Therapeutic exercise;DME and/or AE instruction;Energy conservation;Therapeutic activities;Patient/family education;Balance training    OT Goals(Current goals can be found in the care plan section) Acute Rehab OT Goals Patient Stated Goal: return to ILF/PLOF OT Goal Formulation: With patient Time For Goal Achievement: 11/25/20 Potential to Achieve Goals: Good ADL Goals Pt Will Perform Grooming: standing;with modified independence (c LRAD PRN and no cues for sequencing) Pt Will Perform Lower Body Dressing: Independently;sit to/from stand Pt Will Perform Toileting - Clothing Manipulation and hygiene: with modified independence;sitting/lateral leans  OT Frequency: Min  1X/week    AM-PAC OT "6 Clicks" Daily Activity     Outcome Measure Help from another person eating meals?: A Little Help from another person taking care of personal grooming?: A Little Help from another person toileting, which includes using toliet, bedpan, or urinal?: A Little Help from another person bathing (including washing, rinsing, drying)?: A Little Help from another person to put on and taking off regular upper body clothing?: A Little Help from another person to put on and taking off regular lower body clothing?: A Little 6 Click Score: 18   End of Session Equipment Utilized During Treatment: Rolling walker  Activity Tolerance: Patient tolerated treatment well Patient left: in chair;with call bell/phone within reach  OT Visit Diagnosis: Other abnormalities of gait and mobility (R26.89);Muscle weakness (generalized) (M62.81)                Time: 1100-1140 OT Time Calculation (min): 40 min Charges:  OT General Charges $OT Visit: 1 Visit OT Evaluation $OT Eval Low Complexity: 1 Low OT Treatments $Self Care/Home Management : 23-37 mins  Kathie Dike, M.S. OTR/L  11/11/20, 1:38 PM  ascom [phone]

## 2020-11-11 NOTE — Care Plan (Signed)
Patient without pain this morning. Needs surgery consult for consideration of  cholecystectomy. Liver enzymes with great improvement with removal of CBD stones. GI will sign-off at this time.  Merlyn Lot MD, MPH [facility]

## 2020-11-11 NOTE — Consult Note (Signed)
  Psychiatry: Came by to see the patient to perform consult today at exactly the moment he was being transferred to the operating room.  We will continue to follow-up and try and do the consult at the earliest possible time.

## 2020-11-11 NOTE — Anesthesia Procedure Notes (Signed)
Procedure Name: Intubation Date/Time: 11/11/2020 2:28 PM Performed by: Karoline Caldwell, CRNA Pre-anesthesia Checklist: Patient identified, Patient being monitored, Timeout performed, Emergency Drugs available and Suction available Patient Re-evaluated:Patient Re-evaluated prior to induction Oxygen Delivery Method: Circle system utilized Preoxygenation: Pre-oxygenation with 100% oxygen Induction Type: IV induction and Rapid sequence Laryngoscope Size: 4 and McGraph Grade View: Grade I Tube type: Oral Tube size: 7.5 mm Number of attempts: 1 Airway Equipment and Method: Stylet and Video-laryngoscopy Placement Confirmation: ETT inserted through vocal cords under direct vision, positive ETCO2 and breath sounds checked- equal and bilateral Secured at: 22 cm Tube secured with: Tape Dental Injury: Teeth and Oropharynx as per pre-operative assessment

## 2020-11-11 NOTE — Progress Notes (Signed)
PHARMACIST - PHYSICIAN COMMUNICATION  CONCERNING:  Enoxaparin (Lovenox) for DVT Prophylaxis    RECOMMENDATION: Patient was prescribed enoxaparin 40mg  q24 hours for VTE prophylaxis.   Filed Weights   11/08/20 1804 11/10/20 0430 11/11/20 0527  Weight: 125 kg (275 lb 9.2 oz) 121.6 kg (268 lb 1.3 oz) 124.2 kg (273 lb 13 oz)    Body mass index is 39.29 kg/m.  Estimated Creatinine Clearance: 69.9 mL/min (by C-G formula based on SCr of 1.04 mg/dL).   Based on [facility] policy patient is candidate for enoxaparin 0.5mg /kg TBW SQ every 24 hours based on BMI being >30.  DESCRIPTION: Pharmacy has adjusted enoxaparin dose per [facility] policy.  Patient is now receiving enoxaparin 62.5 mg every 24 hours   [facility] 11/11/2020 7:33 PM

## 2020-11-11 NOTE — Consult Note (Signed)
Subjective:   CC: choledocolithiasis  HPI:  Brad Reed is a 86 y.o. male who is consulted by Chipper Herb for evaluation of above cc.  Symptoms were first noted a few days ago. Only endorses N/V.  No pain during this episode.  Presented to ED for persistent N/V and weakness. Workup showed above, s/p ERCP.  Currently asymptomatic.   Past Medical History:  has a past medical history of Angiodysplasia (02/13/2006), Arthritis, Benign prostatic hypertrophy, Colon polyps, Diverticulosis of colon, Hypertension, Macular degeneration, wet (HCC), Obesity, Peripheral neuropathy, and Sleep apnea.  Past Surgical History:  has a past surgical history that includes Mastoid debridement; Appendectomy; Tonsillectomy; Pilonidal cyst excision (1960); Knee arthroscopy; Cataract extraction w/PHACO (Left, 03/05/2018); Cataract extraction w/PHACO (Right, 03/19/2018); IR Guided Drain W Catheter Placement (07/03/2018); IR Radiologist Eval & Mgmt (07/18/2018); and ERCP (N/A, 11/10/2020).  Family History: family history includes Cancer in his mother; Diabetes in his maternal uncle and sister; Obesity in his sister.  Social History:  reports that he quit smoking about 37 years ago. His smoking use included cigarettes. He has never used smokeless tobacco. He reports current alcohol use of about 14.0 standard drinks of alcohol per week. He reports that he does not use drugs.  Current Medications:  Prior to Admission medications   Medication Sig Start Date End Date Taking? Authorizing Provider  multivitamin-lutein (OCUVITE-LUTEIN) CAPS capsule Take 1 capsule by mouth daily.   Yes [provider]  triamterene-hydrochlorothiazide (DYAZIDE) 37.5-25 MG capsule Take 1 each (1 capsule total) by mouth daily. 09/11/18  Yes Karie Schwalbe, MD  potassium chloride (K-DUR) 10 MEQ tablet Take 1 tablet (10 mEq total) by mouth daily. Patient not taking: Reported on 11/09/2020 09/13/18   Karie Schwalbe, MD    Allergies:  Allergies  as of 11/08/2020 - Review Complete 11/08/2020  Allergen Reaction Noted   Ampicillin Hives 07/15/2018    ROS:  General: Denies weight loss, weight gain, fatigue, fevers, chills, and night sweats. Eyes: Denies blurry vision, double vision, eye pain, itchy eyes, and tearing. Ears: Denies hearing loss, earache, and ringing in ears. Nose: Denies sinus pain, congestion, infections, runny nose, and nosebleeds. Mouth/throat: Denies hoarseness, sore throat, bleeding gums, and difficulty swallowing. Heart: Denies chest pain, palpitations, racing heart, irregular heartbeat, leg pain or swelling, and decreased activity tolerance. Respiratory: Denies breathing difficulty, shortness of breath, wheezing, cough, and sputum. GI: Denies change in appetite, heartburn, nausea, vomiting, constipation, diarrhea, and blood in stool. GU: Denies difficulty urinating, pain with urinating, urgency, frequency, blood in urine. Musculoskeletal: Denies joint stiffness, pain, swelling, muscle weakness. Skin: Denies rash, itching, mass, tumors, sores, and boils Neurologic: Denies headache, fainting, dizziness, seizures, numbness, and tingling. Psychiatric: Denies depression, anxiety, difficulty sleeping, and memory loss. Endocrine: Denies heat or cold intolerance, and increased thirst or urination. Blood/lymph: Denies easy bruising, and swollen glands     Objective:     BP (!) 153/82 (BP Location: Left Arm)   Pulse 65   Temp 97.6 F (36.4 C) (Oral)   Resp 20   Ht 5\' 10"  (1.778 m)   Wt 124.2 kg   SpO2 91%   BMI 39.29 kg/m    Constitutional :  alert, cooperative, appears stated age, and no distress  Lymphatics/Throat:  no asymmetry, masses, or scars  Respiratory:  clear to auscultation bilaterally  Cardiovascular:  regular rate and rhythm  Gastrointestinal: soft, non-tender; bowel sounds normal; no masses,  no organomegaly.   Musculoskeletal: Steady movement  Skin: Cool and moist  Psychiatric: Normal  affect, non-agitated, not confused       LABS:  CMP Latest Ref Rng & Units 11/11/2020 11/10/2020 11/09/2020  Glucose 70 - 99 mg/dL 408(X) 96 448(J)  BUN 8 - 23 mg/dL 18 16 14   Creatinine 0.61 - 1.24 mg/dL 8.56 3.14  Sodium 135 - 145 mmol/L 136 139 139  Potassium 3.5 - 5.1 mmol/L 4.1 3.2(L) 3.0(L)  Chloride 98 - 111 mmol/L 99 102 104  CO2 22 - 32 mmol/L 30 28 28   Calcium 8.9 - 10.3 mg/dL 9.1 9.70) )  Total Protein 6.5 - 8.1 g/dL 2.6(V) 5.6(L) 5.9(L)  Total Bilirubin 0.3 - 1.2 mg/dL 5.5(H) 9.4(H) 8.1(H)  Alkaline Phos 38 - 126 U/L 101 108 112  AST 15 - 41 U/L 62(H) 136(H) 367(H)  ALT 0 - 44 U/L 154(H) 227(H) 376(H)   CBC Latest Ref Rng & Units 11/11/2020 11/10/2020 11/09/2020  WBC 4.0 - 10.5 K/uL 7.7 10.5 13.5(H)  Hemoglobin 13.0 - 17.0 g/dL 11/12/2020 11/11/2020 02.7  Hematocrit 39.0 - 52.0 % 40.9 40.5 41.6  Platelets 150 - 400 K/uL 174 146(L) 145(L)     RADS: CLINICAL DATA:  Right upper quadrant abdominal pain, transaminitis   EXAM: MRI ABDOMEN WITH CONTRAST (WITH MRCP)   TECHNIQUE: Multiplanar multisequence MR imaging of the abdomen was performed following the administration of intravenous contrast. Heavily T2-weighted images of the biliary and pancreatic ducts were obtained, and three-dimensional MRCP images were rendered by post processing.   CONTRAST:  97mL GADAVIST GADOBUTROL 1 MMOL/ML IV SOLN   COMPARISON:  None.   FINDINGS: Lower chest: No pleural effusion.  Mild global cardiomegaly.   Hepatobiliary: There is limited evaluation of the hepatic parenchyma due to respiratory motion artifact with particular limitation of the a in opposed phase images. No focal intrahepatic masses identified. There is no intrahepatic biliary ductal dilation. The main and intrahepatic portal venous structures are patent.   Cholelithiasis noted. Additionally, there are at least 3 intraluminal filling defects identified measuring up to 6 mm within the distal common duct in keeping with  changes of cholelithiasis. These are best noted on image # 24/3 and image # 17 and 20/4. There is mild peribiliary edema identified involving the distal duct and within the pancreatico duodenal groove in keeping with mild cholangitis. The gallbladder itself is contracted and no pericholecystic inflammatory changes identified.   Pancreas: Inflammatory changes within common duct distally and within the pancreatico duodenal groove do not appear to extend to involve the head of the pancreas. The pancreas is otherwise unremarkable.   Spleen:  Unremarkable   Adrenals/Urinary Tract: The adrenal glands are unremarkable. Multiple simple tiny cortical cysts are seen arising from the right kidney. The kidneys are otherwise unremarkable.   Stomach/Bowel: Visualized portions within the abdomen are unremarkable.   Vascular/Lymphatic: Retroaortic left renal vein. The abdominal vasculature is otherwise unremarkable. No pathologic adenopathy within the upper abdomen.   Other:  None.   Musculoskeletal: Multiple lesions are identified within the visualized thoracolumbar spine most in keeping with intraosseous hemangioma, the largest seen within the T9 vertebral body. Additional smaller lesions are noted within T10 T12 and L2. No suspicious focal lesions are identified.   IMPRESSION: Choledocholithiasis with at least 3 intraluminal calculi within the distal common duct measuring up to 7 mm in greatest dimension. Mild peribiliary inflammatory change involving the distal common duct and extending into the pancreatico duodenal groove.   Cholelithiasis.   Mild cardiomegaly.     Electronically Signed   By: 28.7 MD  On: 11/08/2020 23:19 Assessment:      Choledocolithiasis, likely cholangitis, s/p ERCP Surery consulted for interval lap chole.  Plan:      Discussed the risk of surgery including post-op infxn, seroma, biloma, chronic pain, poor-delayed wound healing, retained  gallstone, conversion to open procedure, post-op SBO or ileus, and need for additional procedures to address said risks.  The risks of general anesthetic including MI, CVA, sudden death or even reaction to anesthetic medications also discussed. Alternatives include continued observation.  Benefits include possible symptom relief, prevention of complications including acute cholecystitis, pancreatitis.  Typical post operative recovery of 3-5 days rest, continued pain in area and incision sites, possible loose stools up to 4-6 weeks, also discussed.  The patient understands the risks, any and all questions were answered to the patient's satisfaction.  To OR for robotic lap chole.  Son, [place], notified and is in agreement as well

## 2020-11-11 NOTE — Progress Notes (Signed)
Physical Therapy Treatment Patient Details Name: Brad Reed MRN: [id number] DOB: January 27, 1935 Today's Date: 11/11/2020    History of Present Illness Brad Reed is an 86yoM admitted on 11/08/20 with c/c of N&V. Pt being treated for severe sepsis. Imaging revealed choledocholithiasis & cholelithiasis. PMH: HTN, diverticulosis, knee arthritis, macular degeneration, obesity, peripheral neuropathy, sleep apnea. Pt underwent ERCP on 6/28 with continuation of orders.    PT Comments    Pt in bed upon entry, agreeable to session. Wall suction not working properly, hence linen soiled with urine, minimal bowel. Pt assisted to BR for urgency of bladder. Pt assisted with pericare twice due to baseline difficulty reaching crevices. Pt able to advance AMB in hall to nearly 429ft but velocity remains appropriate for household distances. Pt denies any frank decline in AMB status, no LOB in session, however has difficulty with transfers somewhat worse than baseline. Pt left up in chair at EOS with breakfast tray (liquids), NA in room. Will continue to follow.     Follow Up Recommendations  Home health PT;Supervision - Intermittent     Equipment Recommendations  None recommended by PT    Recommendations for Other Services       Precautions / Restrictions Precautions Precautions: Fall    Mobility  Bed Mobility Overal bed mobility: Modified Independent                  Transfers Overall transfer level: Needs assistance Equipment used: Rolling walker (2 wheeled) Transfers: Sit to/from Stand Sit to Stand: Min guard;Supervision         General transfer comment: suopervision with grab bar/toilet, arm rests/recliner; needs elevated EOB to rise with RW  Ambulation/Gait Ambulation/Gait assistance: Supervision Gait Distance (Feet): 380 Feet Assistive device: Rolling walker (2 wheeled) Gait Pattern/deviations: Trunk flexed Gait velocity: 0.35m/s       Stairs              Wheelchair Mobility    Modified Rankin (Stroke Patients Only)       Balance                                            Cognition Arousal/Alertness: Awake/alert Behavior During Therapy: WFL for tasks assessed/performed Overall Cognitive Status: Within Functional Limits for tasks assessed                                        Exercises      General Comments        Pertinent Vitals/Pain Pain Assessment: No/denies pain    Home Living                      Prior Function            PT Goals (current goals can now be found in the care plan section) Acute Rehab PT Goals Patient Stated Goal: return to ILF/PLOF PT Goal Formulation: With patient Time For Goal Achievement: 11/23/20 Potential to Achieve Goals: Good Progress towards PT goals: Progressing toward goals    Frequency    Min 2X/week      PT Plan Current plan remains appropriate    Co-evaluation              AM-PAC PT "6 Clicks" Mobility   Outcome Measure  Help needed turning from your back to your side while in a flat bed without using bedrails?: None Help needed moving from lying on your back to sitting on the side of a flat bed without using bedrails?: None Help needed moving to and from a bed to a chair (including a wheelchair)?: A Little Help needed standing up from a chair using your arms (e.g., wheelchair or bedside chair)?: A Little Help needed to walk in hospital room?: A Little Help needed climbing 3-5 steps with a railing? : A Little 6 Click Score: 20    End of Session   Activity Tolerance: Patient tolerated treatment well;No increased pain Patient left: in bed;with call bell/phone within reach Nurse Communication: Mobility status PT Visit Diagnosis: Muscle weakness (generalized) (M62.81);Unsteadiness on feet (R26.81)     Time: 2025-4270 PT Time Calculation (min) (ACUTE ONLY): 43 min  Charges:  $Therapeutic Exercise: 23-37  mins $Therapeutic Activity: 8-22 mins                    9:56 AM, 11/11/20 Rosamaria Lints, PT, DPT Physical Therapist - [facility]  [phone] (ASCOM)    Deania Siguenza C 11/11/2020, 9:52 AM

## 2020-11-11 NOTE — Op Note (Signed)
Preoperative diagnosis:  acute and cholecystitis and choledocholithiasis  Postoperative diagnosis: same as above  Procedure: Robotic assisted Laparoscopic Cholecystectomy.  Lysis of adhesions  Anesthesia: GETA   Surgeon: Sung Amabile  Specimen: Gallbladder  Complications: None  EBL: 63mL  Wound Classification: Clean Contaminated  Indications: see HPI  Findings: Extensive scarring from previous liver abscess formation causing stomach and duodenum to be adhered to the inflamed gallbladder. Cystic duct and cystic artery eventually identified, but due to the inflammation portion of the posterior wall had to be left behind.  Description of procedure:  The patient was placed on the operating table in the supine position. SCDs placed, pre-op abx administered.  General anesthesia was induced and OG tube placed by anesthesia. A time-out was completed verifying correct patient, procedure, site, positioning, and implant(s) and/or special equipment prior to beginning this procedure. The abdomen was prepped and draped in the usual sterile fashion.    Veress needle was placed at the Palmer's point and insufflation was started after confirming a positive saline drop test and no immediate increase in abdominal pressure.  After reaching 15 mm, the Veress needle was removed and a 8 mm port was placed via optiview technique under umbilicus measured 52mm from gallbladder.  The abdomen was inspected and no abnormalities or injuries were found.  Under direct vision, ports were placed in the following locations: One 12 mm patient left of the umbilicus, 8cm from the optiviewed port, one 8 mm port placed to the patient right of the umbilical port 8 cm apart.  1 additional 8 mm port placed lateral to the 22mm port.  Once ports were placed, The table was placed in the reverse Trendelenburg position with the right side up. The Xi platform was brought into the operative field and docked to the ports successfully.  An  endoscope was placed through the umbilical port, fenestrated grasper through the adjacent patient right port, prograsp to the far patient left port, and then a hook cautery in the left port.  Likely from previous liver abscess formation, the stomach and portion of the duodenum was extensively adhered to the area inflamed gallbladder as well as the falciform ligament and left liver lobe.  Approximately 1/3 of the procedure time had to be dedicated towards lysis of adhesions to isolate the duodenum and stomach away from the gallbladder and liver to obtain adequate visualization.  During this portion, a portion of the duodenum was still densely adhered to the gallbladder peritoneum and small serosal tear developed during dissection.  This area was reinforced with 3-0 silk in a running Lembert fashion.  After the lysis of adhesion was completed, the dome of the gallbladder was able to be grasped with prograsp, passed and retracted over the dome of the liver.  The thickened peritoneum was meticulously were lysed via hook cautery. The infundibulum was grasped with the fenestrated grasper and retracted toward the right lower quadrant. This maneuver exposed possible area of Calot's triangle.    However, due to the extensive thick scarring, duct and artery cannot be visualized.  Top-down approach was taken to identify the ducts, but this proved unsuccessful as well and created a hole in the gallbladder.  Through the hole, all stones were extracted and the cystic duct was able to be visualized from within.  By visualizing the cystic duct with from the inside, additional dissection was carried out and the cystic duct was eventually identified.  It was clipped and ligated.  Dissection was then carried out from the duct  towards the dissected portion of the top-down approach noted to separate the gallbladder from the fossa.  During this portion, the cystic artery was encountered.  Attempt was made to isolate the artery to  place clips around the but this proved unsuccessful, therefore vessel sealer was used to ligate the artery.    The gallbladder was then eventually dissected from its peritoneal and liver bed attachments by electrocautery.  A portion of the posterior wall had to be left behind in order to minimize any additional bleeding or injury to the surrounding tissue.  Hemostasis was checked prior to removing the hook cautery and the Endo Catch bag was then placed through the 12 mm port and the gallbladder was removed.  The gallbladder was passed off the table as a specimen. There was no evidence of bleeding from the gallbladder fossa or cystic artery or leakage of the bile from the cystic duct stump.  15 [place] Blake drain was placed through the right lower quadrant port, secured to the skin using 3-0 nylon.    The 12 mm port site closed with PMI using 0 vicryl under direct vision.  Abdomen desufflated and secondary trocars were removed under direct vision. No bleeding was noted. All skin incisions then closed with subcuticular sutures of 4-0 monocryl and dressed with topical skin adhesive. The orogastric tube was removed and patient extubated.  The patient tolerated the procedure well and was taken to the postanesthesia care unit in stable condition.  All sponge and instrument count correct at end of procedure.

## 2020-11-11 NOTE — Transfer of Care (Signed)
Immediate Anesthesia Transfer of Care Note  Patient: Brad Reed  Procedure(s) Performed: XI ROBOTIC ASSISTED LAPAROSCOPIC CHOLECYSTECTOMY  Patient Location: PACU  Anesthesia Type:General  Level of Consciousness: awake and alert   Airway & Oxygen Therapy: Patient Spontanous Breathing and Patient connected to nasal cannula oxygen  Post-op Assessment: Report given to RN and Post -op Vital signs reviewed and stable  Post vital signs: Reviewed and stable  Last Vitals:  Vitals Value Taken Time  BP 156/80 11/11/20 1750  Temp    Pulse 66 11/11/20 1754  Resp 18 11/11/20 1754  SpO2 99 % 11/11/20 1754  Vitals shown include unvalidated device data.  Last Pain:  Vitals:   11/11/20 1508  TempSrc:   PainSc: 0-No pain         Complications: No notable events documented.

## 2020-11-11 NOTE — Care Management Important Message (Signed)
Important Message  Patient Details  Name: Brad Reed MRN: [id number] Date of Birth: [birth year]   Medicare Important Message Given:  Yes     [name] 11/11/2020, 11:14 AM

## 2020-11-12 DIAGNOSIS — K810 Acute cholecystitis: Secondary | ICD-10-CM

## 2020-11-12 DIAGNOSIS — F4329 Adjustment disorder with other symptoms: Secondary | ICD-10-CM

## 2020-11-12 LAB — CBC WITH DIFFERENTIAL/PLATELET
Abs Immature Granulocytes: 0.06 10*3/uL (ref 0.00–0.07)
Basophils Absolute: 0 10*3/uL (ref 0.0–0.1)
Basophils Relative: 0 %
Eosinophils Absolute: 0 10*3/uL (ref 0.0–0.5)
Eosinophils Relative: 0 %
HCT: 40.1 % (ref 39.0–52.0)
Hemoglobin: 13.7 g/dL (ref 13.0–17.0)
Immature Granulocytes: 1 %
Lymphocytes Relative: 7 %
Lymphs Abs: 0.8 10*3/uL (ref 0.7–4.0)
MCH: 33.2 pg (ref 26.0–34.0)
MCHC: 34.2 g/dL (ref 30.0–36.0)
MCV: 97.1 fL (ref 80.0–100.0)
Monocytes Absolute: 0.8 10*3/uL (ref 0.1–1.0)
Monocytes Relative: 7 %
Neutro Abs: 10.4 10*3/uL — ABNORMAL HIGH (ref 1.7–7.7)
Neutrophils Relative %: 85 %
Platelets: 208 10*3/uL (ref 150–400)
RBC: 4.13 MIL/uL — ABNORMAL LOW (ref 4.22–5.81)
RDW: 12.7 % (ref 11.5–15.5)
WBC: 12.1 10*3/uL — ABNORMAL HIGH (ref 4.0–10.5)
nRBC: 0 % (ref 0.0–0.2)

## 2020-11-12 LAB — COMPREHENSIVE METABOLIC PANEL
ALT: 125 U/L — ABNORMAL HIGH (ref 0–44)
AST: 56 U/L — ABNORMAL HIGH (ref 15–41)
Albumin: 2.5 g/dL — ABNORMAL LOW (ref 3.5–5.0)
Alkaline Phosphatase: 93 U/L (ref 38–126)
Anion gap: 6 (ref 5–15)
BUN: 17 mg/dL (ref 8–23)
CO2: 28 mmol/L (ref 22–32)
Calcium: 8.4 mg/dL — ABNORMAL LOW (ref 8.9–10.3)
Chloride: 104 mmol/L (ref 98–111)
Creatinine, Ser: 0.91 mg/dL (ref 0.61–1.24)
GFR, Estimated: >60 mL/min (ref >60)
Glucose, Bld: 133 mg/dL — ABNORMAL HIGH (ref 70–99)
Potassium: 3.7 mmol/L (ref 3.5–5.1)
Sodium: 138 mmol/L (ref 135–145)
Total Bilirubin: 3.3 mg/dL — ABNORMAL HIGH (ref 0.3–1.2)
Total Protein: 5.4 g/dL — ABNORMAL LOW (ref 6.5–8.1)

## 2020-11-12 LAB — MAGNESIUM: Magnesium: 2.1 mg/dL (ref 1.7–2.4)

## 2020-11-12 MED ORDER — PIPERACILLIN-TAZOBACTAM 3.375 G IVPB
3.3750 g | Freq: Three times a day (TID) | INTRAVENOUS | Status: DC
  Administered 2020-11-12 – 2020-11-13 (×3): 3.375 g via INTRAVENOUS
  Filled 2020-11-12 (×3): qty 50

## 2020-11-12 MED ORDER — PHENYLEPHRINE HCL (PRESSORS) 10 MG/ML IV SOLN
INTRAVENOUS | Status: AC
  Filled 2020-11-12: qty 1

## 2020-11-12 NOTE — Anesthesia Postprocedure Evaluation (Signed)
Anesthesia Post Note  Patient: Brad Reed  Procedure(s) Performed: XI ROBOTIC ASSISTED LAPAROSCOPIC CHOLECYSTECTOMY  Patient location during evaluation: PACU Anesthesia Type: General Level of consciousness: awake and alert Pain management: pain level controlled Vital Signs Assessment: post-procedure vital signs reviewed and stable Respiratory status: spontaneous breathing, nonlabored ventilation, respiratory function stable and patient connected to nasal cannula oxygen Cardiovascular status: blood pressure returned to baseline and stable Postop Assessment: no apparent nausea or vomiting Anesthetic complications: no   No notable events documented.   Last Vitals:  Vitals:   11/11/20 1939 11/11/20 2327  BP: (!) 161/71 140/70  Pulse: 65 67  Resp: 17 15  Temp: 36.6 C 36.6 C  SpO2: 94% 97%    Last Pain:  Vitals:   11/11/20 2100  TempSrc:   PainSc: 5                  Cleda Mccreedy Kayley Zeiders

## 2020-11-12 NOTE — Progress Notes (Signed)
Subjective:  CC: Brad Reed is a 86 y.o. male  Hospital stay day 4, 1 Day Post-Op robotic lap chole  HPI: No acute issues overnight.  ROS:  General: Denies weight loss, weight gain, fatigue, fevers, chills, and night sweats. Heart: Denies chest pain, palpitations, racing heart, irregular heartbeat, leg pain or swelling, and decreased activity tolerance. Respiratory: Denies breathing difficulty, shortness of breath, wheezing, cough, and sputum. GI: Denies change in appetite, heartburn, nausea, vomiting, constipation, diarrhea, and blood in stool. GU: Denies difficulty urinating, pain with urinating, urgency, frequency, blood in urine.   Objective:   Temp:  [97.6 F (36.4 C)-98.8 F (37.1 C)] 98.1 F (36.7 C) (06/30 0737) Pulse Rate:  [64-68] 67 (06/30 0737) Resp:  [12-19] 19 (06/30 0737) BP: (134-168)/(68-82) 140/68 (06/30 0737) SpO2:  [91 %-99 %] 95 % (06/30 0737) Weight:  [125 kg] 125 kg (06/30 0500)     Height: 5\' 10"  (177.8 cm) Weight: 125 kg BMI (Calculated): 39.54   Intake/Output this shift:   Intake/Output Summary (Last 24 hours) at 11/12/2020 1016 Last data filed at 11/12/2020 11/14/2020 Gross per 24 hour  Intake 1175.87 ml  Output 860 ml  Net 315.87 ml    Constitutional :  alert, cooperative, appears stated age, and no distress  Respiratory:  clear to auscultation bilaterally  Cardiovascular:  regular rate and rhythm  Gastrointestinal: Soft, no guarding, JP with serosanguinous fluid  TTP in RUQ area.   Skin: Cool and moist. Incisions c/d/I.  Psychiatric: Normal affect, non-agitated, not confused       LABS:  CMP Latest Ref Rng & Units 11/12/2020 11/11/2020 11/10/2020  Glucose 70 - 99 mg/dL 11/12/2020) 960(A) 96  BUN 8 - 23 mg/dL 17 18 16   Creatinine 0.61 - 1.24 mg/dL 540(J 8.11  Sodium 135 - 145 mmol/L 138 136 139  Potassium 3.5 - 5.1 mmol/L 3.7 4.1 3.2(L)  Chloride 98 - 111 mmol/L 104 99 102  CO2 22 - 32 mmol/L 28 30 28   Calcium 8.9 - 10.3 mg/dL 9.14) 9.1  7.82)  Total Protein 6.5 - 8.1 g/dL ) 5.7(L) 5.6(L)  Total Bilirubin 0.3 - 1.2 mg/dL 3.3(H) 5.5(H) 9.4(H)  Alkaline Phos 38 - 126 U/L 93 101 108  AST 15 - 41 U/L 56(H) 62(H) 136(H)  ALT 0 - 44 U/L 125(H) 154(H) 227(H)   CBC Latest Ref Rng & Units 11/12/2020 11/11/2020 11/10/2020  WBC 4.0 - 10.5 K/uL 12.1(H) 7.7 10.5  Hemoglobin 13.0 - 17.0 g/dL 11/14/2020 11/13/2020 11/12/2020  Hematocrit 39.0 - 52.0 % 40.1 40.9 40.5  Platelets 150 - 400 K/uL 208 174 146(L)    RADS: N/a Assessment:   S/p robotic lap chole.  Difficult case with extensive scarring and adhesions to duodenum causing injury.  Recommend continuing NPO for another day to ensure adequate healing of duodenal injury repair.  Continue oral abx as well.

## 2020-11-12 NOTE — Progress Notes (Signed)
PROGRESS NOTE    Brad Reed  SHF:[id number] DOB: 06/16/1934 DOA: 11/08/2020 PCP: Karie Schwalbe, MD    Brief Narrative:  Brad Reed is a 86 y.o. male with medical history significant for essential hypertension, diverticulosis, who is admitted to [facility] on 11/08/2020 with severe sepsis in the setting of acute transaminitis after presenting from home to [facility] ED complaining of nausea/vomiting.  Patient states that that he had a liver abscess a year ago which has since resolved. Upon arrival to emergency room, patient had a significant jaundice with a bilirubin of 8.4, MRCP showed stone in the common bile duct.  Patient has been evaluated by GI, successful ERCP on 6/28. Cholecystectomy was performed on 6/29, showed a significant inflammation and damage to the stomach and duodenum due to adhesion from inflamed gallbladder.   Assessment & Plan:   Principal Problem:   Severe sepsis (HCC) Active Problems:   Essential hypertension, benign   Transaminitis   Nausea & vomiting   Generalized weakness   Lactic acidosis   Choledocholithiasis  #1.  Severe sepsis. Choledocholithiasis with cholecystitis. S/p cholecystectomy.  Patient still n.p.o. due to repair of damage to duodenum. Due to significant infection in the gallbladder, I will resume IV antibiotics, discussed with Dr. Tonna Boehringer, will continue 7 days.  #2.  Generalized weakness. Continue PT/OT.  3.  Chronic alcohol abuse. Hypokalemia. Patient did not develop any alcohol withdrawal.  Continue thiamine folic acid.  Continue to follow BMP.    DVT prophylaxis: SCDs Code Status: DNR Family Communication:  Disposition Plan:    Status is: Inpatient  Remains inpatient appropriate because:Inpatient level of care appropriate due to severity of illness  Dispo: The patient is from: Home              Anticipated d/c is to: Home              Patient currently is not medically stable to d/c.   Difficult to  place patient No        I/O last 3 completed shifts: In: 1156.9 [P.O.:150; I.V.:980.1; IV Piggyback:26.8] Out: 460 [Urine:300; Drains:60; Blood:100] Total I/O In: 545.9 [I.V.:545.9] Out: 400 [Urine:400]     Consultants:  GS, GI  Procedures: ERCP and cholecystectomy.  Antimicrobials: Zosyn  Subjective: Patient doing well today, has some pain in the surgical site.  Otherwise no general abdominal pain or nausea vomiting.  Currently n.p.o. Denies any short of breath or cough. No dysuria hematuria  No fever chills No headache or dizziness. No Chest pain palpitation.  Objective: Vitals:   11/11/20 2327 11/12/20 0336 11/12/20 0500 11/12/20 0737  BP: 140/70 134/73  140/68  Pulse: 67 65  67  Resp: 15 16  19   Temp: 97.8 F (36.6 C) 98.8 F (37.1 C)  98.1 F (36.7 C)  TempSrc:  Oral  Oral  SpO2: 97% 93%  95%  Weight:   125 kg   Height:        Intake/Output Summary (Last 24 hours) at 11/12/2020 1231 Last data filed at 11/12/2020 0824 Gross per 24 hour  Intake 1175.87 ml  Output 860 ml  Net 315.87 ml   Filed Weights   11/10/20 0430 11/11/20 0527 11/12/20 0500  Weight: 121.6 kg 124.2 kg 125 kg    Examination:  General exam: Appears calm and comfortable  Respiratory system: Clear to auscultation. Respiratory effort normal. Cardiovascular system: S1 & S2 heard, RRR. No JVD, murmurs, rubs, gallops or clicks. No pedal edema. Gastrointestinal system:  Abdomen is nondistended, soft and nontender. No organomegaly or masses felt. Normal bowel sounds heard. Central nervous system: Alert and oriented. No focal neurological deficits. Extremities: Symmetric 5 x 5 power. Skin: No rashes, lesions or ulcers Psychiatry: Judgement and insight appear normal. Mood & affect appropriate.     Data Reviewed: I have personally reviewed following labs and imaging studies  CBC: Recent Labs  Lab 11/08/20 1810 11/09/20 0702 11/10/20 0650 11/11/20 0705 11/12/20 0643  WBC 12.6*  13.5* 10.5 7.7 12.1*  NEUTROABS 11.3* 11.1* 8.5* 6.5 10.4*  HGB 15.2 14.7 14.1 14.3 13.7  HCT 43.9 41.6 40.5 40.9 40.1  MCV 96.5 94.5 95.3 95.1 97.1  PLT 171 145* 146* 174 208   Basic Metabolic Panel: Recent Labs  Lab 11/08/20 1810 11/09/20 0702 11/10/20 0650 11/11/20 0705 11/12/20 0643  NA 137 139 139 136 138  K 3.5 3.0* 3.2* 4.1 3.7  CL 102 104 102 99 104  CO2 GLUCOSE 128* 113* 96 181* 133*  BUN CREATININE 1.04 0.74 0.77 1.04 0.91  CALCIUM 8.9 8.6* 8.5* 9.1 8.4*  MG 1.8 2.0  --   --  2.1  PHOS  --  3.1  --   --   --    GFR: Estimated Creatinine Clearance: 80.2 mL/min (by C-G formula based on SCr of 0.91 mg/dL). Liver Function Tests: Recent Labs  Lab 11/08/20 1810 11/09/20 0702 11/10/20 0650 11/11/20 0705 11/12/20 0643  AST 784* 367* 136* 62* 56*  ALT 476* 376* 227* 154* 125*  ALKPHOS 132* 112 108 101 93  BILITOT 4.2* 8.1* 9.4* 5.5* 3.3*  PROT 6.4* 5.9* 5.6* 5.7* 5.4*  ALBUMIN 3.2* 3.0* 2.8* 2.6* 2.5*   Recent Labs  Lab 11/08/20 1810  LIPASE 29   No results for input(s): AMMONIA in the last 168 hours. Coagulation Profile: Recent Labs  Lab 11/08/20 1810 11/09/20 0702  INR 1.2 1.3*   Cardiac Enzymes: No results for input(s): CKTOTAL, CKMB, CKMBINDEX, TROPONINI in the last 168 hours. BNP (last 3 results) No results for input(s): PROBNP in the last 8760 hours. HbA1C: No results for input(s): HGBA1C in the last 72 hours. CBG: No results for input(s): GLUCAP in the last 168 hours. Lipid Profile: No results for input(s): CHOL, HDL, LDLCALC, TRIG, CHOLHDL, LDLDIRECT in the last 72 hours. Thyroid Function Tests: No results for input(s): TSH, T4TOTAL, FREET4, T3FREE, THYROIDAB in the last 72 hours. Anemia Panel: No results for input(s): VITAMINB12, FOLATE, FERRITIN, TIBC, IRON, RETICCTPCT in the last 72 hours. Sepsis Labs: Recent Labs  Lab 11/08/20 1810 11/08/20 1848 11/08/20 2104  PROCALCITON 0.88  --   --    LATICACIDVEN  --  2.3* 1.8    Recent Results (from the past 240 hour(s))  Resp Panel by RT-PCR (Flu A&B, Covid) Nasopharyngeal Swab     Status: None   Collection Time: 11/08/20  6:17 PM   Specimen: Nasopharyngeal Swab; Nasopharyngeal(NP) swabs in vial transport medium  Result Value Ref Range Status   SARS Coronavirus 2 by RT PCR NEGATIVE NEGATIVE Final    Comment: (NOTE) SARS-CoV-2 target nucleic acids are NOT DETECTED.  The SARS-CoV-2 RNA is generally detectable in upper respiratory specimens during the acute phase of infection. The lowest concentration of SARS-CoV-2 viral copies this assay can detect is 138 copies/mL. A negative result does not preclude SARS-Cov-2 infection and should not be used as the sole basis for treatment or other patient management decisions. A negative result  may occur with  improper specimen collection/handling, submission of specimen other than nasopharyngeal swab, presence of viral mutation(s) within the areas targeted by this assay, and inadequate number of viral copies(<138 copies/mL). A negative result must be combined with clinical observations, patient history, and epidemiological information. The expected result is Negative.  Fact Sheet for Patients:  BloggerCourse.com  Fact Sheet for Healthcare Providers:  SeriousBroker.it  This test is no t yet approved or cleared by the [place] FDA and  has been authorized for detection and/or diagnosis of SARS-CoV-2 by FDA under an Emergency Use Authorization (EUA). This EUA will remain  in effect (meaning this test can be used) for the duration of the COVID-19 declaration under Section 564(b)(1) of the Act, 21 U.S.C.section 360bbb-3(b)(1), unless the authorization is terminated  or revoked sooner.       Influenza A by PCR NEGATIVE NEGATIVE Final   Influenza B by PCR NEGATIVE NEGATIVE Final    Comment: (NOTE) The Xpert Xpress SARS-CoV-2/FLU/RSV  plus assay is intended as an aid in the diagnosis of influenza from Nasopharyngeal swab specimens and should not be used as a sole basis for treatment. Nasal washings and aspirates are unacceptable for Xpert Xpress SARS-CoV-2/FLU/RSV testing.  Fact Sheet for Patients: BloggerCourse.com  Fact Sheet for Healthcare Providers: SeriousBroker.it  This test is not yet approved or cleared by the [place] FDA and has been authorized for detection and/or diagnosis of SARS-CoV-2 by FDA under an Emergency Use Authorization (EUA). This EUA will remain in effect (meaning this test can be used) for the duration of the COVID-19 declaration under Section 564(b)(1) of the Act, 21 U.S.C. section 360bbb-3(b)(1), unless the authorization is terminated or revoked.  Performed at [facility], [address] [place], [place] [address]   Blood culture (routine single)     Status: Abnormal   Collection Time: 11/08/20  6:48 PM   Specimen: BLOOD  Result Value Ref Range Status   Specimen Description   Final    BLOOD  RT [facility] Performed at [facility], [address] [place], [place] [address]    Special Requests   Final    BOTTLES DRAWN AEROBIC AND ANAEROBIC Blood Culture adequate volume Performed at [facility], [address] [place], [place] [address]    Culture  Setup Time   Final    IN BOTH AEROBIC AND ANAEROBIC BOTTLES GRAM POSITIVE COCCI CRITICAL RESULT CALLED TO, READ BACK BY AND VERIFIED WITH: MORGAN HICKS 11/09/20 1255 KLW    Culture (A)  Final    STAPHYLOCOCCUS EPIDERMIDIS THE SIGNIFICANCE OF ISOLATING THIS ORGANISM FROM A SINGLE SET OF BLOOD CULTURES WHEN MULTIPLE SETS ARE DRAWN IS UNCERTAIN. PLEASE NOTIFY THE MICROBIOLOGY DEPARTMENT WITHIN ONE WEEK IF SPECIATION AND SENSITIVITIES ARE REQUIRED. Performed at [facility], [address], [place], [place] [address]    Report Status 11/11/2020 FINAL   Final  Blood Culture ID Panel (Reflexed)     Status: Abnormal   Collection Time: 11/08/20  6:48 PM  Result Value Ref Range Status   Enterococcus faecalis NOT DETECTED NOT DETECTED Final   Enterococcus Faecium NOT DETECTED NOT DETECTED Final   Listeria monocytogenes NOT DETECTED NOT DETECTED Final   Staphylococcus species DETECTED (A) NOT DETECTED Final    Comment: CRITICAL RESULT CALLED TO, READ BACK BY AND VERIFIED WITH: MORGAN HICKS 11/09/20 1255 KLW    Staphylococcus aureus (BCID) NOT DETECTED NOT DETECTED Final   Staphylococcus epidermidis DETECTED (A) NOT DETECTED Final  Comment: Methicillin (oxacillin) resistant coagulase negative staphylococcus. Possible blood culture contaminant (unless isolated from more than one blood culture draw or clinical case suggests pathogenicity). No antibiotic treatment is indicated for blood  culture contaminants. CRITICAL RESULT CALLED TO, READ BACK BY AND VERIFIED WITH: MORGAN HICKS 11/09/20 1255 KLW    Staphylococcus lugdunensis NOT DETECTED NOT DETECTED Final   Streptococcus species NOT DETECTED NOT DETECTED Final   Streptococcus agalactiae NOT DETECTED NOT DETECTED Final   Streptococcus pneumoniae NOT DETECTED NOT DETECTED Final   Streptococcus pyogenes NOT DETECTED NOT DETECTED Final   A.calcoaceticus-baumannii NOT DETECTED NOT DETECTED Final   Bacteroides fragilis NOT DETECTED NOT DETECTED Final   Enterobacterales NOT DETECTED NOT DETECTED Final   Enterobacter cloacae complex NOT DETECTED NOT DETECTED Final   Escherichia coli NOT DETECTED NOT DETECTED Final   Klebsiella aerogenes NOT DETECTED NOT DETECTED Final   Klebsiella oxytoca NOT DETECTED NOT DETECTED Final   Klebsiella pneumoniae NOT DETECTED NOT DETECTED Final   Proteus species NOT DETECTED NOT DETECTED Final   Salmonella species NOT DETECTED NOT DETECTED Final   Serratia marcescens NOT DETECTED NOT DETECTED Final   Haemophilus influenzae NOT DETECTED NOT DETECTED Final    Neisseria meningitidis NOT DETECTED NOT DETECTED Final   Pseudomonas aeruginosa NOT DETECTED NOT DETECTED Final   Stenotrophomonas maltophilia NOT DETECTED NOT DETECTED Final   Candida albicans NOT DETECTED NOT DETECTED Final   Candida auris NOT DETECTED NOT DETECTED Final   Candida glabrata NOT DETECTED NOT DETECTED Final   Candida krusei NOT DETECTED NOT DETECTED Final   Candida parapsilosis NOT DETECTED NOT DETECTED Final   Candida tropicalis NOT DETECTED NOT DETECTED Final   Cryptococcus neoformans/gattii NOT DETECTED NOT DETECTED Final   Methicillin resistance mecA/C DETECTED (A) NOT DETECTED Final    Comment: CRITICAL RESULT CALLED TO, READ BACK BY AND VERIFIED WITH: MORGAN HICKS 11/09/20 1255 KLW Performed at [facility], [address] [place], [place] [address]   Urine culture     Status: Abnormal   Collection Time: 11/08/20  9:53 PM   Specimen: In/Out Cath Urine  Result Value Ref Range Status   Specimen Description   Final    IN/OUT CATH URINE Performed at [facility], [address] [place], [place] [address]    Special Requests   Final    NONE Performed at [facility], [address] [place], [place] [address]    Culture MULTIPLE SPECIES PRESENT, SUGGEST RECOLLECTION (A)  Final   Report Status 11/10/2020 FINAL  Final  CULTURE, BLOOD (ROUTINE X 2) w Reflex to ID Panel     Status: None (Preliminary result)   Collection Time: 11/09/20  2:22 PM   Specimen: BLOOD RIGHT HAND  Result Value Ref Range Status   Specimen Description BLOOD RIGHT HAND  Final   Special Requests   Final    BOTTLES DRAWN AEROBIC AND ANAEROBIC Blood Culture results may not be optimal due to an inadequate volume of blood received in culture bottles   Culture   Final    NO GROWTH 3 DAYS Performed at [facility], [address] [place], [place] [address]    Report Status PENDING  Incomplete  CULTURE, BLOOD (ROUTINE X 2) w Reflex to ID Panel     Status:  None (Preliminary result)   Collection Time: 11/09/20  2:22 PM   Specimen: BLOOD LEFT HAND  Result Value Ref Range Status   Specimen Description BLOOD LEFT HAND  Final   Special  Requests   Final    BOTTLES DRAWN AEROBIC AND ANAEROBIC Blood Culture results may not be optimal due to an inadequate volume of blood received in culture bottles   Culture   Final    NO GROWTH 3 DAYS Performed at [facility], [address], [place], [place] [address]    Report Status PENDING  Incomplete         Radiology Studies: DG C-Arm 1-60 Min-No Report  Result Date: 11/10/2020 Fluoroscopy was utilized by the requesting physician.  No radiographic interpretation.        Scheduled Meds:  amoxicillin-clavulanate  1 tablet Oral Q12H   enoxaparin (LOVENOX) injection  0.5 mg/kg Subcutaneous Q24H   thiamine injection  100 mg Intravenous Daily   Continuous Infusions:  sodium chloride 50 mL/hr at 11/12/20 1022     LOS: 4 days    Time spent: 28 minutes    Marrion Coy, MD [facility]   To contact the attending provider between 7A-7P or the covering provider during after hours 7P-7A, please log into the web site [email or web address] and access using universal Powers password for that web site. If you do not have the password, please call the hospital operator.  11/12/2020, 12:31 PM

## 2020-11-12 NOTE — TOC Progression Note (Signed)
Transition of Care [facility]) - Progression Note    Patient Details  Name: Brad Reed MRN: [id number] Date of Birth: 1934-11-20  Transition of Care [facility]) CM/SW Contact  Gildardo Griffes, [place] Phone Number: 11/12/2020, 9:55 AM  Clinical Narrative:     CSW spoke with son, reports touring [place] yesterday for ALF. Reports does not see advantages of ALF over current ILF, CSW informed of PT and OT eval recommendations of home health and informed of [facility] contract with [facility] and Edie, patient son agreeable for patient to receive [facility] home health services at time of discharge.   Continues to express concerns with patient's lack of self care and decision making capacity. CSW informed of psych consult however psych was unable to see yesterday.   CSW has informed Regina with [facility] of patient's need for [facility] home health services at time of discharge back to [facility].    Expected Discharge Plan:  (TBD)    Expected Discharge Plan and Services Expected Discharge Plan:  (TBD)     Post Acute Care Choice:  (TBD) Living arrangements for the past 2 months: Independent Living Facility                                       Social Determinants of Health (SDOH) Interventions    Readmission Risk Interventions No flowsheet data found.

## 2020-11-12 NOTE — Consult Note (Signed)
[facility] Face-to-Face Psychiatry Consult   Reason for Consult: Consult for this 86 year old man in the hospital for several medical problems.  Mainly for gallbladder disease resulting in surgery.  Consult was requested evidently because of son's concern about the patient's cognitive abilities Referring Physician:  Chipper Herb Patient Identification: Brad Reed MRN:  [id number] Principal Diagnosis: Adjustment disorder with mixed emotional features Diagnosis:  Principal Problem:   Adjustment disorder with mixed emotional features Active Problems:   Essential hypertension, benign   Severe sepsis (HCC)   Transaminitis   Nausea & vomiting   Generalized weakness   Lactic acidosis   Choledocholithiasis   Acute cholecystitis   Total Time spent with patient: 1 hour  Subjective:   Brad Reed is a 86 y.o. male patient admitted with "I had been falling and I could not get up".  HPI: Patient seen chart reviewed.  86 year old man came into the hospital after a fall at home.  Noted to be jaundiced on admission.  Eventually diagnosed with cholelithiasis and underwent a cholecystectomy.  Patient says today he is feeling significantly better.  He has a little bit of pain at the operation site but otherwise no physical complaints.  Patient was able to describe his home situation and the events leading to hospitalization in an appropriate manner.  He lives with his sister in an apartment at [facility] for the last couple years.  Occasionally has some falls at home even though he uses his walker.  Patient says that for the most part his mood is okay although he admits to being somewhat dysphoric chronically which he blames on hating the place where he lives.  When asked to describe why he hates being at [facility] he mainly cited the fact that it made him feel like he was restricted to even though it does not sound like in any practical way he really is restricted.  Also however that he dislikes the food very much  and so he ends up getting a lot of take-out food.  Patient describes his usual daily routine is getting up and getting dressed watching baseball on television.  Occasionally will drive.  Says he mostly drives locally now although he admits that sometime fairly recently he drove to [place].  He is aware that his son is upset about that but the patient sees nothing wrong with it.  Says that he had no accidents no episodes of getting lost no significant problems on the trip.  Denies any accidents or feelings of difficulty driving locally.  Patient does not feel that he has significant memory impairment.  He says he is aware that his son believes that he "does not know where I am".  Patient however believes that he is cognitively in okay shape.  On interview he is able to repeat 3 words immediately remember 3 words accurately at about 4 minutes.  He is oriented to place situation and date.  He is able to tell stories in a coherent manner.  No evidence of disorganized thinking or delusions.  Patient says that he at times has had thoughts of wishing that he were dead when he thinks about how he does not care very much for his current life but says he has never had any thought of killing himself.  Denies any history of attempts to harm himself.  Denies any hallucinations.  Asked about alcohol use he says that he drinks "a glass of vodka once in a while".  He insists that he  has never believed that alcohol was a problem for him and that no one around him has ever suggested that alcohol was a problem.  Denies any drug use.  Patient's son is already power of attorney.  Patient says he regrets having signed that over to his son because that is why he now lives in [place].  Despite this he does not express any active hostility or thought of harm or aggression towards his son.  I reviewed a lot of recent notes by his primary care doctor.  Looks like son has been expressing concern about memory and self care to the  primary care doctor for a while.  Primary care doctor has suggested to the patient that he limit his driving or stop driving.  Primary care doctor has done a evaluation screening for dementia but has determined that as of most recent evaluation patient does not frankly have dementia.  Past Psychiatric History: Patient denies ever having seen a therapist psychiatrist or mental health provider in the past.  Denies any suicide attempts.  Denies believing that he has an alcohol problem and says he has never had any treatment for alcohol or drug abuse.  Risk to Self:   Risk to Others:   Prior Inpatient Therapy:   Prior Outpatient Therapy:    Past Medical History:  Past Medical History:  Diagnosis Date   Angiodysplasia 02/13/2006   Arthritis    knees   Benign prostatic hypertrophy    Colon polyps    Diverticulosis of colon    Hypertension    Macular degeneration, wet (HCC)    Dr Chyrl Civatte   Obesity    Peripheral neuropathy    feet   Sleep apnea    DX'd when younger. declined CPAP.    Past Surgical History:  Procedure Laterality Date   APPENDECTOMY     CATARACT EXTRACTION W/PHACO Left 03/05/2018   Procedure: CATARACT EXTRACTION PHACO AND INTRAOCULAR LENS PLACEMENT (IOC) LEFT;  Surgeon: Nevada Crane, MD;  Location: [facility];  Service: Ophthalmology;  Laterality: Left;  sleep apnea   CATARACT EXTRACTION W/PHACO Right 03/19/2018   Procedure: CATARACT EXTRACTION PHACO AND INTRAOCULAR LENS PLACEMENT (IOC);  Surgeon: Nevada Crane, MD;  Location: [facility];  Service: Ophthalmology;  Laterality: Right;   ERCP N/A 11/10/2020   Procedure: ENDOSCOPIC RETROGRADE CHOLANGIOPANCREATOGRAPHY (ERCP);  Surgeon: Midge Minium, MD;  Location: [facility];  Service: Endoscopy;  Laterality: N/A;   IR GUIDED DRAIN W CATHETER PLACEMENT  07/03/2018   IR RADIOLOGIST EVAL & MGMT  07/18/2018   KNEE ARTHROSCOPY     BOTH KNEES   MASTOID DEBRIDEMENT     PILONIDAL CYST EXCISION   1960   TONSILLECTOMY     Family History:  Family History  Problem Relation Age of Onset   Cancer Mother    Obesity Sister    Diabetes Sister    Diabetes Maternal Uncle    Family Psychiatric  History: Denies knowing of any Social History:  Social History   Substance and Sexual Activity  Alcohol Use Yes   Alcohol/week: 14.0 standard drinks   Types: 14 Shots of liquor per week   Comment: Drinks 2 cocktails /day     Social History   Substance and Sexual Activity  Drug Use No    Social History   Socioeconomic History   Marital status: Widowed    Spouse name: Not on file   Number of children: 3   Years of education: Not on file  Highest education level: Not on file  Occupational History   Occupation: RETIRED    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)  Tobacco Use   Smoking status: Former    Pack years: 0.00    Types: Cigarettes    Quit date: 05/17/1983    Years since quitting: 37.5   Smokeless tobacco: Never  Vaping Use   Vaping Use: Never used  Substance and Sexual Activity   Alcohol use: Yes    Alcohol/week: 14.0 standard drinks    Types: 14 Shots of liquor per week    Comment: Drinks 2 cocktails /day   Drug use: No   Sexual activity: Not on file  Other Topics Concern   Not on file  Social History Narrative   Has living will    Son Algernon HuxleyGlen is health care POA   Would accept resuscitation but no prolonged artificial life support   No tube feeds if cognitively unaware   Social Determinants of Health   Financial Resource Strain: Not on file  Food Insecurity: Not on file  Transportation Needs: Not on file  Physical Activity: Not on file  Stress: Not on file  Social Connections: Not on file   Additional Social History:    Allergies:  No Active Allergies  Labs:  Results for orders placed or performed during the hospital encounter of 11/08/20 (from the past 48 hour(s))  Comprehensive metabolic panel     Status: Abnormal   Collection  Time: 11/11/20  7:05 AM  Result Value Ref Range   Sodium 136 135 - 145 mmol/L   Potassium 4.1 3.5 - 5.1 mmol/L   Chloride 99 98 - 111 mmol/L   CO2 30 22 - 32 mmol/L   Glucose, Bld 181 (H) 70 - 99 mg/dL    Comment: Glucose reference range applies only to samples taken after fasting for at least 8 hours.   BUN 18 8 - 23 mg/dL   Creatinine, Ser 0.981.04 0.61 - 1.24 mg/dL   Calcium 9.1 8.9 - 11.910.3 mg/dL   Total Protein 5.7 (L) 6.5 - 8.1 g/dL   Albumin 2.6 (L) 3.5 - 5.0 g/dL   AST 62 (H) 15 - 41 U/L   ALT 154 (H) 0 - 44 U/L   Alkaline Phosphatase 101 38 - 126 U/L   Total Bilirubin 5.5 (H) 0.3 - 1.2 mg/dL   GFR, Estimated >14>60 >78>60 mL/min    Comment: (NOTE) Calculated using the CKD-EPI Creatinine Equation (2021)    Anion gap 7 5 - 15    Comment: Performed at [facility], [address] [place], [place] [id number]  CBC with Differential/Platelet     Status: Abnormal   Collection Time: 11/11/20  7:05 AM  Result Value Ref Range   WBC 7.7 4.0 - 10.5 K/uL   RBC 4.30 4.22 - 5.81 MIL/uL   Hemoglobin 14.3 13.0 - 17.0 g/dL   HCT 13.040.9 86.539.0 - 78.452.0 %   MCV 95.1 80.0 - 100.0 fL   MCH 33.3 26.0 - 34.0 pg   MCHC 35.0 30.0 - 36.0 g/dL   RDW 69.612.3 29.511.5 - 28.415.5 %   Platelets 174 150 - 400 K/uL   nRBC 0.0 0.0 - 0.2 %   Neutrophils Relative % 85 %   Neutro Abs 6.5 1.7 - 7.7 K/uL   Lymphocytes Relative 8 %   Lymphs Abs 0.6 (L) 0.7 - 4.0 K/uL   Monocytes Relative 6 %   Monocytes Absolute 0.5 0.1 - 1.0 K/uL  Eosinophils Relative 0 %   Eosinophils Absolute 0.0 0.0 - 0.5 K/uL   Basophils Relative 0 %   Basophils Absolute 0.0 0.0 - 0.1 K/uL   Immature Granulocytes 1 %   Abs Immature Granulocytes 0.04 0.00 - 0.07 K/uL    Comment: Performed at [facility], [address] [place], [place] [address]  Comprehensive metabolic panel     Status: Abnormal   Collection Time: 11/12/20  6:43 AM  Result Value Ref Range   Sodium 138 135 - 145 mmol/L   Potassium 3.7 3.5 - 5.1 mmol/L   Chloride  104 98 - 111 mmol/L   CO2 28 22 - 32 mmol/L   Glucose, Bld 133 (H) 70 - 99 mg/dL    Comment: Glucose reference range applies only to samples taken after fasting for at least 8 hours.   BUN 17 8 - 23 mg/dL   Creatinine, Ser 1.96 0.61 - 1.24 mg/dL   Calcium 8.4 (L) 8.9 - 10.3 mg/dL   Total Protein 5.4 (L) 6.5 - 8.1 g/dL   Albumin 2.5 (L) 3.5 - 5.0 g/dL   AST 56 (H) 15 - 41 U/L   ALT 125 (H) 0 - 44 U/L   Alkaline Phosphatase 93 38 - 126 U/L   Total Bilirubin 3.3 (H) 0.3 - 1.2 mg/dL   GFR, Estimated >22 >29 mL/min    Comment: (NOTE) Calculated using the CKD-EPI Creatinine Equation (2021)    Anion gap 6 5 - 15    Comment: Performed at [facility], [address] [place], [place] [address]  CBC with Differential/Platelet     Status: Abnormal   Collection Time: 11/12/20  6:43 AM  Result Value Ref Range   WBC 12.1 (H) 4.0 - 10.5 K/uL   RBC 4.13 (L) 4.22 - 5.81 MIL/uL   Hemoglobin 13.7 13.0 - 17.0 g/dL   HCT 11.9 41.7 - 40.8 %   MCV 97.1 80.0 - 100.0 fL   MCH 33.2 26.0 - 34.0 pg   MCHC 34.2 30.0 - 36.0 g/dL   RDW 14.4 81.8 - 56.3 %   Platelets 208 150 - 400 K/uL   nRBC 0.0 0.0 - 0.2 %   Neutrophils Relative % 85 %   Neutro Abs 10.4 (H) 1.7 - 7.7 K/uL   Lymphocytes Relative 7 %   Lymphs Abs 0.8 0.7 - 4.0 K/uL   Monocytes Relative 7 %   Monocytes Absolute 0.8 0.1 - 1.0 K/uL   Eosinophils Relative 0 %   Eosinophils Absolute 0.0 0.0 - 0.5 K/uL   Basophils Relative 0 %   Basophils Absolute 0.0 0.0 - 0.1 K/uL   Immature Granulocytes 1 %   Abs Immature Granulocytes 0.06 0.00 - 0.07 K/uL    Comment: Performed at [facility], [address] [place], [place] [address]  Magnesium     Status: None   Collection Time: 11/12/20  6:43 AM  Result Value Ref Range   Magnesium 2.1 1.7 - 2.4 mg/dL    Comment: Performed at [facility], [address] [place], [place] [address]    Current Facility-Administered Medications  Medication Dose Route Frequency  Provider Last Rate Last Admin   0.9 %  sodium chloride infusion   Intravenous Continuous Sakai, Isami, DO 50 mL/hr at 11/12/20 1022 New Bag at 11/12/20 1022   acetaminophen (TYLENOL) tablet 650 mg  650 mg Oral Q6H PRN Tonna Boehringer, Isami, DO       Or   acetaminophen (TYLENOL) suppository  650 mg  650 mg Rectal Q6H PRN Tonna Boehringer, Isami, DO       enoxaparin (LOVENOX) injection 62.5 mg  0.5 mg/kg Subcutaneous Q24H Tressie Ellis, RPH   62.5 mg at 11/12/20 0757   HYDROcodone-acetaminophen (NORCO/VICODIN) 5-325 MG per tablet 1-2 tablet  1-2 tablet Oral Q4H PRN Tonna Boehringer, Isami, DO       LORazepam (ATIVAN) injection 0.5 mg  0.5 mg Intravenous Q4H PRN Sakai, Isami, DO       morphine 2 MG/ML injection 1 mg  1 mg Intravenous Q3H PRN Tonna Boehringer, Isami, DO       ondansetron (ZOFRAN) injection 4 mg  4 mg Intravenous Q6H PRN Sakai, Isami, DO       piperacillin-tazobactam (ZOSYN) IVPB 3.375 g  3.375 g Intravenous Tobe Sos, MD 12.5 mL/hr at 11/12/20 1400 3.375 g at 11/12/20 1400   thiamine (B-1) injection 100 mg  100 mg Intravenous Daily Sakai, Isami, DO   100 mg at 11/12/20 0756   traMADol (ULTRAM) tablet 50 mg  50 mg Oral Q6H PRN Sung Amabile, DO        Musculoskeletal: Strength & Muscle Tone: decreased Gait & Station: unsteady Patient leans: N/A            Psychiatric Specialty Exam:  Presentation  General Appearance:  No data recorded Eye Contact: No data recorded Speech: No data recorded Speech Volume: No data recorded Handedness: No data recorded  Mood and Affect  Mood: No data recorded Affect: No data recorded  Thought Process  Thought Processes: No data recorded Descriptions of Associations:No data recorded Orientation:No data recorded Thought Content:No data recorded History of Schizophrenia/Schizoaffective disorder:No data recorded Duration of Psychotic Symptoms:No data recorded Hallucinations:No data recorded Ideas of Reference:No data recorded Suicidal Thoughts:No data  recorded Homicidal Thoughts:No data recorded  Sensorium  Memory: No data recorded Judgment: No data recorded Insight: No data recorded  Executive Functions  Concentration: No data recorded Attention Span: No data recorded Recall: No data recorded Fund of Knowledge: No data recorded Language: No data recorded  Psychomotor Activity  Psychomotor Activity: No data recorded  Assets  Assets: No data recorded  Sleep  Sleep: No data recorded  Physical Exam: Physical Exam Vitals and nursing note reviewed.  Constitutional:      Appearance: Normal appearance.  HENT:     Head: Normocephalic and atraumatic.     Mouth/Throat:     Pharynx: Oropharynx is clear.  Eyes:     Pupils: Pupils are equal, round, and reactive to light.  Cardiovascular:     Rate and Rhythm: Normal rate and regular rhythm.  Pulmonary:     Effort: Pulmonary effort is normal.     Breath sounds: Normal breath sounds.  Abdominal:     General: Abdomen is flat.     Palpations: Abdomen is soft.  Musculoskeletal:        General: Normal range of motion.  Skin:    General: Skin is warm and dry.  Neurological:     General: No focal deficit present.     Mental Status: He is alert. Mental status is at baseline.  Psychiatric:        Attention and Perception: Attention normal.        Mood and Affect: Mood normal.        Speech: Speech normal.        Behavior: Behavior normal.        Thought Content: Thought content normal.        Cognition  and Memory: Cognition normal.        Judgment: Judgment normal.   Review of Systems  Constitutional: Negative.   HENT: Negative.    Eyes: Negative.   Respiratory: Negative.    Cardiovascular: Negative.   Gastrointestinal: Negative.   Musculoskeletal: Negative.   Skin: Negative.   Neurological: Negative.   Psychiatric/Behavioral:  Negative for depression, hallucinations, memory loss, substance abuse and suicidal ideas. The patient is not nervous/anxious and  does not have insomnia.   Blood pressure 137/64, pulse 70, temperature 97.8 F (36.6 C), temperature source Oral, resp. rate 20, height 5\' 10"  (1.778 m), weight 125 kg, SpO2 95 %. Body mass index is 39.54 kg/m.  Treatment Plan Summary: Plan 86 year old man with multiple acute and chronic medical problems.  Chronic dysphoria which he relates to dissatisfaction with his living situation.  Despite his dissatisfaction he sounds resigned to it.  He does not express any intent to rebel or refused to go to [facility].  He acknowledges the limitations he has from his medical problems.  He is able to rationally discuss risks and benefits.  I did not do a formal bedside screening for dementia but based on the memory testing I did and the patient's conversation and orientation he does not appear to have significant dementia.  Dysphoria but not major depression.  No evidence that the patient would lack the ability to make basic decisions for himself.  No reason to forcibly restrict any rights.  Supportive counseling and encouragement and therapy.  Patient will follow up with his primary care doctor over time.  Might at some point consider whether dysphoria would benefit from a trial of antidepressant but I will defer on that right now.  Disposition: No evidence of imminent risk to self or others at present.   Patient does not meet criteria for psychiatric inpatient admission. Supportive therapy provided about ongoing stressors.  [facility] MD 11/12/2020 3:18 PM

## 2020-11-13 ENCOUNTER — Inpatient Hospital Stay: Payer: Medicare HMO

## 2020-11-13 LAB — BASIC METABOLIC PANEL
Anion gap: 7 (ref 5–15)
BUN: 17 mg/dL (ref 8–23)
CO2: 26 mmol/L (ref 22–32)
Calcium: 8 mg/dL — ABNORMAL LOW (ref 8.9–10.3)
Chloride: 105 mmol/L (ref 98–111)
Creatinine, Ser: 0.95 mg/dL (ref 0.61–1.24)
GFR, Estimated: >60 mL/min (ref >60)
Glucose, Bld: 96 mg/dL (ref 70–99)
Potassium: 3.2 mmol/L — ABNORMAL LOW (ref 3.5–5.1)
Sodium: 138 mmol/L (ref 135–145)

## 2020-11-13 LAB — CBC WITH DIFFERENTIAL/PLATELET
Abs Immature Granulocytes: 0.06 10*3/uL (ref 0.00–0.07)
Basophils Absolute: 0.1 10*3/uL (ref 0.0–0.1)
Basophils Relative: 1 %
Eosinophils Absolute: 0.1 10*3/uL (ref 0.0–0.5)
Eosinophils Relative: 1 %
HCT: 39.1 % (ref 39.0–52.0)
Hemoglobin: 13.5 g/dL (ref 13.0–17.0)
Immature Granulocytes: 1 %
Lymphocytes Relative: 12 %
Lymphs Abs: 1.2 10*3/uL (ref 0.7–4.0)
MCH: 33.7 pg (ref 26.0–34.0)
MCHC: 34.5 g/dL (ref 30.0–36.0)
MCV: 97.5 fL (ref 80.0–100.0)
Monocytes Absolute: 1.1 10*3/uL — ABNORMAL HIGH (ref 0.1–1.0)
Monocytes Relative: 11 %
Neutro Abs: 7.6 10*3/uL (ref 1.7–7.7)
Neutrophils Relative %: 74 %
Platelets: 160 10*3/uL (ref 150–400)
RBC: 4.01 MIL/uL — ABNORMAL LOW (ref 4.22–5.81)
RDW: 12.8 % (ref 11.5–15.5)
WBC: 10.1 10*3/uL (ref 4.0–10.5)
nRBC: 0 % (ref 0.0–0.2)

## 2020-11-13 LAB — MAGNESIUM: Magnesium: 1.8 mg/dL (ref 1.7–2.4)

## 2020-11-13 MED ORDER — POTASSIUM CHLORIDE 10 MEQ/100ML IV SOLN
10.0000 meq | Freq: Once | INTRAVENOUS | Status: AC
  Administered 2020-11-13: 10 meq via INTRAVENOUS
  Filled 2020-11-13: qty 100

## 2020-11-13 MED ORDER — KCL-LACTATED RINGERS-D5W 20 MEQ/L IV SOLN
INTRAVENOUS | Status: DC
  Filled 2020-11-13 (×2): qty 1000

## 2020-11-13 MED ORDER — POTASSIUM CHLORIDE 10 MEQ/100ML IV SOLN
10.0000 meq | INTRAVENOUS | Status: AC
  Administered 2020-11-13: 10 meq via INTRAVENOUS
  Filled 2020-11-13 (×2): qty 100

## 2020-11-13 MED ORDER — GUAIFENESIN-DM 100-10 MG/5ML PO SYRP
5.0000 mL | ORAL_SOLUTION | Freq: Once | ORAL | Status: AC
  Administered 2020-11-13: 5 mL via ORAL
  Filled 2020-11-13: qty 5

## 2020-11-13 MED ORDER — POTASSIUM CHLORIDE 2 MEQ/ML IV SOLN: INTRAVENOUS | Status: DC

## 2020-11-13 MED ORDER — AMOXICILLIN-POT CLAVULANATE 875-125 MG PO TABS
1.0000 | ORAL_TABLET | Freq: Two times a day (BID) | ORAL | Status: DC
  Administered 2020-11-13 – 2020-11-19 (×13): 1 via ORAL
  Filled 2020-11-13 (×13): qty 1

## 2020-11-13 MED ORDER — POTASSIUM CHLORIDE 2 MEQ/ML IV SOLN
INTRAVENOUS | Status: DC
  Filled 2020-11-13 (×3): qty 1000

## 2020-11-13 NOTE — Progress Notes (Signed)
PT Cancellation Note  Patient Details Name: Brad Reed MRN: [id number] DOB: Dec 16, 1934   Cancelled Treatment:    Reason Eval/Treat Not Completed: Medical issues which prohibited therapy (Per RN/OT, pt had a moment of global weakness earlier, required controlled lowering to floor with stay. MD asking if PT recommendations still appropriate at this time. Will defer PT session to next day, plan on reassessment and will update DC recs PRN.) 3:26 PM, 11/13/20 Brad Reed, PT, DPT Physical Therapist - [facility]  [phone] (ASCOM)     Brad Reed 11/13/2020, 3:26 PM

## 2020-11-13 NOTE — Plan of Care (Signed)
  Problem: Education: Goal: Knowledge of General Education information will improve Description Including pain rating scale, medication(s)/side effects and non-pharmacologic comfort measures Outcome: Progressing   Problem: Nutrition: Goal: Adequate nutrition will be maintained Outcome: Progressing   Problem: Pain Managment: Goal: General experience of comfort will improve Outcome: Progressing   

## 2020-11-13 NOTE — Discharge Instructions (Signed)
Laparoscopic Cholecystectomy, Care After This sheet gives you information about how to care for yourself after your procedure. Your doctor may also give you more specific instructions. If you have problems or questions, contact your doctor. Follow these instructions at home: Care for cuts from surgery (incisions)  Follow instructions from your doctor about how to take care of your cuts from surgery. Make sure you: Wash your hands with soap and water before you change your bandage (dressing). If you cannot use soap and water, use hand sanitizer. Change your bandage as told by your doctor. Leave stitches (sutures), skin glue, or skin tape (adhesive) strips in place. They may need to stay in place for 2 weeks or longer. If tape strips get loose and curl up, you may trim the loose edges. Do not remove tape strips completely unless your doctor says it is okay. Do not take baths, swim, or use a hot tub until your doctor says it is okay. OK TO SHOWER 24HRS AFTER YOUR SURGERY.  Check your surgical cut area every day for signs of infection. Check for: More redness, swelling, or pain. More fluid or blood. Warmth. Pus or a bad smell. Activity Do not drive or use heavy machinery while taking prescription pain medicine. Do not play contact sports until your doctor says it is okay. Do not drive for 24 hours if you were given a medicine to help you relax (sedative). Rest as needed. Do not return to work or school until your doctor says it is okay. General instructions Keep and recored drain output as instructed by nursing Take antibiotics as prescribed  tylenol and advil as needed for discomfort.  Please alternate between the two every four hours as needed for pain.    Use narcotics, if prescribed, only when tylenol and motrin is not enough to control pain.  325-650mg  every 8hrs to max of 3000mg /24hrs (including the 325mg  in every norco dose) for the tylenol.    Advil up to 800mg  per dose every 8hrs as  needed for pain.   To prevent or treat constipation while you are taking prescription pain medicine, your doctor may recommend that you: Drink enough fluid to keep your pee (urine) clear or pale yellow. Take over-the-counter or prescription medicines. Eat foods that are high in fiber, such as fresh fruits and vegetables, whole grains, and beans. Limit foods that are high in fat and processed sugars, such as fried and sweet foods. Contact a doctor if: You develop a rash. You have more redness, swelling, or pain around your surgical cuts. You have more fluid or blood coming from your surgical cuts. Your surgical cuts feel warm to the touch. You have pus or a bad smell coming from your surgical cuts. You have a fever. One or more of your surgical cuts breaks open. Get help right away if: You have trouble breathing. You have chest pain. You have pain that is getting worse in your shoulders. You faint or feel dizzy when you stand. You have very bad pain in your belly (abdomen). You are sick to your stomach (nauseous) for more than one day. You have throwing up (vomiting) that lasts for more than one day. You have leg pain. This information is not intended to replace advice given to you by your health care provider. Make sure you discuss any questions you have with your health care provider. Document Released: 02/09/2008 Document Revised: 11/21/2015 Document Reviewed: 10/19/2015 Elsevier Interactive Patient Education  2019 02/11/2008.

## 2020-11-13 NOTE — Progress Notes (Signed)
PROGRESS NOTE    Brad Reed  NGE:[id number] DOB: 29-Mar-1935 DOA: 11/08/2020 PCP: Karie Schwalbe, MD   Chief complaint.  Nausea vomiting. Brief Narrative:  Brad Reed is a 86 y.o. male with medical history significant for essential hypertension, diverticulosis, who is admitted to [facility] on 11/08/2020 with severe sepsis in the setting of acute transaminitis after presenting from home to [facility] ED complaining of nausea/vomiting.  Patient states that that he had a liver abscess a year ago which has since resolved. Upon arrival to emergency room, patient had a significant jaundice with a bilirubin of 8.4, MRCP showed stone in the common bile duct.  Patient has been evaluated by GI, successful ERCP on 6/28. Cholecystectomy was performed on 6/29, showed a significant inflammation and damage to the stomach and duodenum due to adhesion from inflamed gallbladder.   Assessment & Plan:   Principal Problem:   Adjustment disorder with mixed emotional features Active Problems:   Essential hypertension, benign   Severe sepsis (HCC)   Transaminitis   Nausea & vomiting   Generalized weakness   Lactic acidosis   Choledocholithiasis   Acute cholecystitis    #1.  Severe sepsis. Choledocholithiasis with cholecystitis. Status post a cholecystectomy and duodenum repair.  Status post ERCP. Continue antibiotics.  Patient is still n.p.o. per surgery.  Patient does not have evidence of volume overload, he appears to have mild volume depletion.  We will give increased dose of IV fluids.  #2.  Generalized weakness. Continue PT/OT P  3.  Chronic alcohol abuse. Hypokalemia No evidence of alcohol withdrawal.  Continue supplement potassium.  Continue thiamine and folic acid.    DVT prophylaxis: SCDs Code Status: DNR Family Communication:  Disposition Plan:    Status is: Inpatient  Remains inpatient appropriate because:Inpatient level of care appropriate due to  severity of illness  Dispo: The patient is from: Home              Anticipated d/c is to: SNF              Patient currently is not medically stable to d/c.   Difficult to place patient No        I/O last 3 completed shifts: In: 1370.3 [I.V.:1314.2; IV Piggyback:56.2] Out: 1530 [Urine:1300; Drains:230] No intake/output data recorded.     Consultants:  General surgery  Procedures: Cholecystectomy.  Antimicrobials: Zosyn.  Subjective: Patient try to walk to the bathroom, halfway through, he collapsed on the floor.  No syncope. Patient still has significant needs after transferred back in bed. He does not have any abdominal pain or nausea vomiting. He does not have any short of breath or cough. No chest pain or palpitation. No dysuria hematuria. No fever or chills   Objective: Vitals:   11/12/20 1941 11/13/20 0436 11/13/20 0600 11/13/20 0808  BP: 136/78 (!) 143/77  (!) 149/69  Pulse: 74 78  71  Resp: Temp: 98.2 F (36.8 C) 98.3 F (36.8 C)  98.3 F (36.8 C)  TempSrc:      SpO2: 92% 90%  92%  Weight:   123.2 kg   Height:        Intake/Output Summary (Last 24 hours) at 11/13/2020 1022 Last data filed at 11/13/2020 0500 Gross per 24 hour  Intake 824.45 ml  Output 780 ml  Net 44.45 ml   Filed Weights   11/11/20 0527 11/12/20 0500 11/13/20 0600  Weight: 124.2 kg 125 kg 123.2 kg  Examination:  General exam: Appears calm and comfortable  Respiratory system: Clear to auscultation. Respiratory effort normal. Cardiovascular system: S1 & S2 heard, RRR. No JVD, murmurs, rubs, gallops or clicks. No pedal edema. Gastrointestinal system: Abdomen is nondistended, soft and nontender. No organomegaly or masses felt. Normal bowel sounds heard. Central nervous system: Alert and oriented x3. No focal neurological deficits. Extremities: Symmetric 5 x 5 power. Skin: MM dry. Psychiatry: Judgement and insight appear normal. Mood & affect appropriate.     Data  Reviewed: I have personally reviewed following labs and imaging studies  CBC: Recent Labs  Lab 11/09/20 0702 11/10/20 0650 11/11/20 0705 11/12/20 0643 11/13/20 0602  WBC 13.5* 10.5 7.7 12.1* 10.1  NEUTROABS 11.1* 8.5* 6.5 10.4* 7.6  HGB 14.7 14.1 14.3 13.7 13.5  HCT 41.6 40.5 40.9 40.1 39.1  MCV 94.5 95.3 95.1 97.1 97.5  PLT 145* 146* 174 208 160   Basic Metabolic Panel: Recent Labs  Lab 11/08/20 1810 11/09/20 0702 11/10/20 0650 11/11/20 0705 11/12/20 0643 11/13/20 0602  NA 137 139 139 136 138 138  K 3.5 3.0* 3.2* 4.1 3.7 3.2*  CL 102 104 102 99 104 105  CO2 27 28 28 30 28 26   GLUCOSE 128* 113* 96 181* 133* 96  BUN 14 14 16 18 17 17   CREATININE 1.04 0.74 0.77 1.04 0.91 0.95  CALCIUM 8.9 8.6* 8.5* 9.1 8.4* 8.0*  MG 1.8 2.0  --   --  2.1 1.8  PHOS  --  3.1  --   --   --   --    GFR: Estimated Creatinine Clearance: 76.2 mL/min (by C-G formula based on SCr of 0.95 mg/dL). Liver Function Tests: Recent Labs  Lab 11/08/20 1810 11/09/20 0702 11/10/20 0650 11/11/20 0705 11/12/20 0643  AST 784* 367* 136* 62* 56*  ALT 476* 376* 227* 154* 125*  ALKPHOS 132* 112 108 101 93  BILITOT 4.2* 8.1* 9.4* 5.5* 3.3*  PROT 6.4* 5.9* 5.6* 5.7* 5.4*  ALBUMIN 3.2* 3.0* 2.8* 2.6* 2.5*   Recent Labs  Lab 11/08/20 1810  LIPASE 29   No results for input(s): AMMONIA in the last 168 hours. Coagulation Profile: Recent Labs  Lab 11/08/20 1810 11/09/20 0702  INR 1.2 1.3*   Cardiac Enzymes: No results for input(s): CKTOTAL, CKMB, CKMBINDEX, TROPONINI in the last 168 hours. BNP (last 3 results) No results for input(s): PROBNP in the last 8760 hours. HbA1C: No results for input(s): HGBA1C in the last 72 hours. CBG: No results for input(s): GLUCAP in the last 168 hours. Lipid Profile: No results for input(s): CHOL, HDL, LDLCALC, TRIG, CHOLHDL, LDLDIRECT in the last 72 hours. Thyroid Function Tests: No results for input(s): TSH, T4TOTAL, FREET4, T3FREE, THYROIDAB in the last 72  hours. Anemia Panel: No results for input(s): VITAMINB12, FOLATE, FERRITIN, TIBC, IRON, RETICCTPCT in the last 72 hours. Sepsis Labs: Recent Labs  Lab 11/08/20 1810 11/08/20 1848 11/08/20 2104  PROCALCITON 0.88  --   --   LATICACIDVEN  --  2.3* 1.8    Recent Results (from the past 240 hour(s))  Resp Panel by RT-PCR (Flu A&B, Covid) Nasopharyngeal Swab     Status: None   Collection Time: 11/08/20  6:17 PM   Specimen: Nasopharyngeal Swab; Nasopharyngeal(NP) swabs in vial transport medium  Result Value Ref Range Status   SARS Coronavirus 2 by RT PCR NEGATIVE NEGATIVE Final    Comment: (NOTE) SARS-CoV-2 target nucleic acids are NOT DETECTED.  The SARS-CoV-2 RNA is generally detectable in upper respiratory  specimens during the acute phase of infection. The lowest concentration of SARS-CoV-2 viral copies this assay can detect is 138 copies/mL. A negative result does not preclude SARS-Cov-2 infection and should not be used as the sole basis for treatment or other patient management decisions. A negative result may occur with  improper specimen collection/handling, submission of specimen other than nasopharyngeal swab, presence of viral mutation(s) within the areas targeted by this assay, and inadequate number of viral copies(<138 copies/mL). A negative result must be combined with clinical observations, patient history, and epidemiological information. The expected result is Negative.  Fact Sheet for Patients:  BloggerCourse.comhttps://www.fda.gov/media/152166/download  Fact Sheet for Healthcare Providers:  SeriousBroker.ithttps://www.fda.gov/media/152162/download  This test is no t yet approved or cleared by the [place] FDA and  has been authorized for detection and/or diagnosis of SARS-CoV-2 by FDA under an Emergency Use Authorization (EUA). This EUA will remain  in effect (meaning this test can be used) for the duration of the COVID-19 declaration under Section 564(b)(1) of the Act, 21 U.S.C.section  360bbb-3(b)(1), unless the authorization is terminated  or revoked sooner.       Influenza A by PCR NEGATIVE NEGATIVE Final   Influenza B by PCR NEGATIVE NEGATIVE Final    Comment: (NOTE) The Xpert Xpress SARS-CoV-2/FLU/RSV plus assay is intended as an aid in the diagnosis of influenza from Nasopharyngeal swab specimens and should not be used as a sole basis for treatment. Nasal washings and aspirates are unacceptable for Xpert Xpress SARS-CoV-2/FLU/RSV testing.  Fact Sheet for Patients: BloggerCourse.comhttps://www.fda.gov/media/152166/download  Fact Sheet for Healthcare Providers: SeriousBroker.ithttps://www.fda.gov/media/152162/download  This test is not yet approved or cleared by the [place] FDA and has been authorized for detection and/or diagnosis of SARS-CoV-2 by FDA under an Emergency Use Authorization (EUA). This EUA will remain in effect (meaning this test can be used) for the duration of the COVID-19 declaration under Section 564(b)(1) of the Act, 21 U.S.C. section 360bbb-3(b)(1), unless the authorization is terminated or revoked.  Performed at [facility], [address] [place], [place] [address]   Blood culture (routine single)     Status: Abnormal   Collection Time: 11/08/20  6:48 PM   Specimen: BLOOD  Result Value Ref Range Status   Specimen Description   Final    BLOOD  RT [facility] Performed at [facility], [address] [place], [place] [address]    Special Requests   Final    BOTTLES DRAWN AEROBIC AND ANAEROBIC Blood Culture adequate volume Performed at [facility], [address] [place], [place] [address]    Culture  Setup Time   Final    IN BOTH AEROBIC AND ANAEROBIC BOTTLES GRAM POSITIVE COCCI CRITICAL RESULT CALLED TO, READ BACK BY AND VERIFIED WITH: MORGAN HICKS 11/09/20 1255 KLW    Culture (A)  Final    STAPHYLOCOCCUS EPIDERMIDIS THE SIGNIFICANCE OF ISOLATING THIS ORGANISM FROM A SINGLE SET OF BLOOD CULTURES WHEN MULTIPLE SETS ARE DRAWN  IS UNCERTAIN. PLEASE NOTIFY THE MICROBIOLOGY DEPARTMENT WITHIN ONE WEEK IF SPECIATION AND SENSITIVITIES ARE REQUIRED. Performed at [facility], [address] [place], [place] [address]    Report Status 11/11/2020 FINAL  Final  Blood Culture ID Panel (Reflexed)     Status: Abnormal   Collection Time: 11/08/20  6:48 PM  Result Value Ref Range Status   Enterococcus faecalis NOT DETECTED NOT DETECTED Final   Enterococcus Faecium NOT DETECTED NOT DETECTED Final   Listeria monocytogenes NOT DETECTED NOT DETECTED Final   Staphylococcus  species DETECTED (A) NOT DETECTED Final    Comment: CRITICAL RESULT CALLED TO, READ BACK BY AND VERIFIED WITH: MORGAN HICKS 11/09/20 1255 KLW    Staphylococcus aureus (BCID) NOT DETECTED NOT DETECTED Final   Staphylococcus epidermidis DETECTED (A) NOT DETECTED Final    Comment: Methicillin (oxacillin) resistant coagulase negative staphylococcus. Possible blood culture contaminant (unless isolated from more than one blood culture draw or clinical case suggests pathogenicity). No antibiotic treatment is indicated for blood  culture contaminants. CRITICAL RESULT CALLED TO, READ BACK BY AND VERIFIED WITH: MORGAN HICKS 11/09/20 1255 KLW    Staphylococcus lugdunensis NOT DETECTED NOT DETECTED Final   Streptococcus species NOT DETECTED NOT DETECTED Final   Streptococcus agalactiae NOT DETECTED NOT DETECTED Final   Streptococcus pneumoniae NOT DETECTED NOT DETECTED Final   Streptococcus pyogenes NOT DETECTED NOT DETECTED Final   A.calcoaceticus-baumannii NOT DETECTED NOT DETECTED Final   Bacteroides fragilis NOT DETECTED NOT DETECTED Final   Enterobacterales NOT DETECTED NOT DETECTED Final   Enterobacter cloacae complex NOT DETECTED NOT DETECTED Final   Escherichia coli NOT DETECTED NOT DETECTED Final   Klebsiella aerogenes NOT DETECTED NOT DETECTED Final   Klebsiella oxytoca NOT DETECTED NOT DETECTED Final   Klebsiella pneumoniae NOT DETECTED NOT DETECTED  Final   Proteus species NOT DETECTED NOT DETECTED Final   Salmonella species NOT DETECTED NOT DETECTED Final   Serratia marcescens NOT DETECTED NOT DETECTED Final   Haemophilus influenzae NOT DETECTED NOT DETECTED Final   Neisseria meningitidis NOT DETECTED NOT DETECTED Final   Pseudomonas aeruginosa NOT DETECTED NOT DETECTED Final   Stenotrophomonas maltophilia NOT DETECTED NOT DETECTED Final   Candida albicans NOT DETECTED NOT DETECTED Final   Candida auris NOT DETECTED NOT DETECTED Final   Candida glabrata NOT DETECTED NOT DETECTED Final   Candida krusei NOT DETECTED NOT DETECTED Final   Candida parapsilosis NOT DETECTED NOT DETECTED Final   Candida tropicalis NOT DETECTED NOT DETECTED Final   Cryptococcus neoformans/gattii NOT DETECTED NOT DETECTED Final   Methicillin resistance mecA/C DETECTED (A) NOT DETECTED Final    Comment: CRITICAL RESULT CALLED TO, READ BACK BY AND VERIFIED WITH: MORGAN HICKS 11/09/20 1255 KLW Performed at [facility], [address] [place], [place] [address]   Urine culture     Status: Abnormal   Collection Time: 11/08/20  9:53 PM   Specimen: In/Out Cath Urine  Result Value Ref Range Status   Specimen Description   Final    IN/OUT CATH URINE Performed at [facility], [address] [place], [place] [address]    Special Requests   Final    NONE Performed at [facility], [address] [place], [place] [address]    Culture MULTIPLE SPECIES PRESENT, SUGGEST RECOLLECTION (A)  Final   Report Status 11/10/2020 FINAL  Final  CULTURE, BLOOD (ROUTINE X 2) w Reflex to ID Panel     Status: None (Preliminary result)   Collection Time: 11/09/20  2:22 PM   Specimen: BLOOD RIGHT HAND  Result Value Ref Range Status   Specimen Description BLOOD RIGHT HAND  Final   Special Requests   Final    BOTTLES DRAWN AEROBIC AND ANAEROBIC Blood Culture results may not be optimal due to an inadequate volume of blood received in culture  bottles   Culture   Final    NO GROWTH 4 DAYS Performed at [facility], [address], [place], [place] [address]    Report Status PENDING  Incomplete  CULTURE, BLOOD (  ROUTINE X 2) w Reflex to ID Panel     Status: None (Preliminary result)   Collection Time: 11/09/20  2:22 PM   Specimen: BLOOD LEFT HAND  Result Value Ref Range Status   Specimen Description BLOOD LEFT HAND  Final   Special Requests   Final    BOTTLES DRAWN AEROBIC AND ANAEROBIC Blood Culture results may not be optimal due to an inadequate volume of blood received in culture bottles   Culture   Final    NO GROWTH 4 DAYS Performed at [facility], [address] [place], [place] [address]    Report Status PENDING  Incomplete         Radiology Studies: No results found.      Scheduled Meds:  enoxaparin (LOVENOX) injection  0.5 mg/kg Subcutaneous Q24H   thiamine injection  100 mg Intravenous Daily   Continuous Infusions:  dextrose 5 % lactated ringers with KCl/Additives Pediatric custom IV fluid 50 mL/hr at 11/13/20 1015   piperacillin-tazobactam (ZOSYN)  IV Stopped (11/13/20 1021)   potassium chloride       LOS: 5 days    Time spent: 32 minutes    Marrion Coy, MD [facility]   To contact the attending provider between 7A-7P or the covering provider during after hours 7P-7A, please log into the web site [email or web address] and access using universal [place] password for that web site. If you do not have the password, please call the hospital operator.  11/13/2020, 10:22 AM

## 2020-11-13 NOTE — Progress Notes (Signed)
Received pt at 1330. Pt alert and oriented x4. No complaints verbalized. Notable R facial droop - as per pt this is baseline since he had Bell's palsy as a child. He has also mentioned that he has not been able to close his R eye since then. Speech clear. JP drain emptied and re-charged. Purewick in place, draining adequately. At 1600 ,noted concern from OT re: pt's mobility strength change from his OT session yesterday. NIHSS (-) completed by this RN and RN Shanda Bumps.  Dr Chipper Herb informed, CT scan ordered stat.

## 2020-11-13 NOTE — [place] FL2 (Signed)
[place] MEDICAID FL2 LEVEL OF CARE SCREENING TOOL     IDENTIFICATION  Patient Name: Brad Reed Birthdate: 11/01/1934 Sex: male Admission Date (Current Location): 11/08/2020  [facility] and [place] Number:  Chiropodist and Address:  [facility], [address], [place], [place] [address]      Provider Number: [id number]  Attending Physician Name and Address:  Marrion Coy, MD  Relative Name and Phone Number:  son [name]    Current Level of Care: Hospital Recommended Level of Care: Skilled Nursing Facility Prior Approval Number:    Date Approved/Denied:   PASRR Number: [id number]  Discharge Plan: SNF    Current Diagnoses: Patient Active Problem List   Diagnosis Date Noted   Acute cholecystitis 11/12/2020   Adjustment disorder with mixed emotional features 11/12/2020   Choledocholithiasis 11/09/2020   Severe sepsis (HCC) 11/08/2020   Transaminitis 11/08/2020   Nausea & vomiting 11/08/2020   Generalized weakness 11/08/2020   Lactic acidosis 11/08/2020   PAD (peripheral artery disease) (HCC) 10/08/2020   Contusion of left hand 07/16/2020   Mood disorder (HCC) 04/06/2020   Osteoarthritis of right knee 04/01/2019   Sensorineural hearing loss (SNHL), bilateral 10/01/2018   Impacted cerumen of both ears 10/01/2018   Memory loss 09/11/2018   Hearing loss 09/11/2018   Urinary incontinence 08/03/2018   Arrhythmia 09/14/2016   Impaired fasting glucose 08/26/2014   Advance directive discussed with patient 08/26/2014   Peripheral neuropathy    Routine general medical examination at a health care facility 08/16/2011   Macular degeneration, wet (HCC)    Obesity, Class III, BMI 40-49.9 (morbid obesity) (HCC) 08/03/2010   BPH with obstruction/lower urinary tract symptoms 12/24/2007   Essential hypertension, benign 12/13/2006   DIVERTICULOSIS, COLON 12/13/2006    Orientation RESPIRATION BLADDER Height & Weight     Self, Time, Situation,  Place  Normal Incontinent, External catheter Weight: 271 lb 9.6 oz (123.2 kg) Height:  5\' 10"  (177.8 cm)  BEHAVIORAL SYMPTOMS/MOOD NEUROLOGICAL BOWEL NUTRITION STATUS      Continent Diet (see discharge summary)  AMBULATORY STATUS COMMUNICATION OF NEEDS Skin   Limited Assist Verbally Other (Comment) (closed wound abdomen)                       Personal Care Assistance Level of Assistance  Bathing, Feeding, Dressing, Total care Bathing Assistance: Limited assistance Feeding assistance: Independent Dressing Assistance: Limited assistance Total Care Assistance: Limited assistance   Functional Limitations Info  Sight, Hearing, Speech Sight Info: Impaired Hearing Info: Adequate Speech Info: Adequate    SPECIAL CARE FACTORS FREQUENCY  PT (By licensed PT), OT (By licensed OT)     PT Frequency: min 4x weekly OT Frequency: min 4x weekly            Contractures Contractures Info: Not present    Additional Factors Info  Code Status, Allergies Code Status Info: DNR Allergies Info: No Active Allergies           Current Medications (11/13/2020):  This is the current hospital active medication list Current Facility-Administered Medications  Medication Dose Route Frequency Provider Last Rate Last Admin   acetaminophen (TYLENOL) tablet 650 mg  650 mg Oral Q6H PRN 01/14/2021, Isami, DO       Or   acetaminophen (TYLENOL) suppository 650 mg  650 mg Rectal Q6H PRN Tonna Boehringer, Isami, DO       amoxicillin-clavulanate (AUGMENTIN) 875-125 MG per tablet 1 tablet  1 tablet Oral Q12H Sakai, Isami,  DO   1 tablet at 11/13/20 1328   enoxaparin (LOVENOX) injection 62.5 mg  0.5 mg/kg Subcutaneous Q24H Tressie Ellis, RPH   62.5 mg at 11/13/20 1008   HYDROcodone-acetaminophen (NORCO/VICODIN) 5-325 MG per tablet 1-2 tablet  1-2 tablet Oral Q4H PRN Tonna Boehringer, Isami, DO       LORazepam (ATIVAN) injection 0.5 mg  0.5 mg Intravenous Q4H PRN Sakai, Isami, DO       morphine 2 MG/ML injection 1 mg  1 mg  Intravenous Q3H PRN Tonna Boehringer, Isami, DO       ondansetron (ZOFRAN) injection 4 mg  4 mg Intravenous Q6H PRN Sakai, Isami, DO       thiamine (B-1) injection 100 mg  100 mg Intravenous Daily Sakai, Isami, DO   100 mg at 11/13/20 1000   traMADol (ULTRAM) tablet 50 mg  50 mg Oral Q6H PRN Sung Amabile, DO         Discharge Medications: Please see discharge summary for a list of discharge medications.  Relevant Imaging Results:  Relevant Lab Results:   Additional Information SSN: [id number]  Gildardo Griffes, LCSW

## 2020-11-13 NOTE — Progress Notes (Signed)
Occupational Therapy Treatment Patient Details Name: Brad Reed MRN: [id number] DOB: 1935/02/11 Today's Date: 11/13/2020    History of present illness Brad Reed is an 86yoM admitted on 11/08/20 with c/c of N&V. Pt being treated for severe sepsis. Imaging revealed choledocholithiasis & cholelithiasis. PMH: HTN, diverticulosis, knee arthritis, macular degeneration, obesity, peripheral neuropathy, sleep apnea. Pt underwent ERCP on 6/28 with continuation of orders.   OT comments  Upon entering the room, pt supine in bed and reports, " I am watching the mets game". OT prompts pt to look at TV because the "mets game" is not playing at this time. Pt reports being lowered to the floor in bathroom because " I couldn't figure out how to turn and turning is hard". Pt agreeable to OT intervention. Pt needing max A to EOB for trunk and B LEs. Pt sitting EOB with supervision and unable to stand from standard bed height (pt reports this is bed height at home). Pt stands from elevated bed with max lifting assistance and initial posterior bias. Pt taking several L lateral steps along bed with use of RW and mod A. Pt needing mod - max multimodal cuing to sequence and initiate this session with is significant change in status. OT notified team and recommended staff not attempt ambulation to bathroom secondary to safety concerns. Pt continues to benefit from OT intervention. Change in recommendation to SNF based on session.   Follow Up Recommendations  SNF;Supervision/Assistance - 24 hour    Equipment Recommendations  Other (comment) (defer to next venue of care)       Precautions / Restrictions Precautions Precautions: Fall Restrictions Weight Bearing Restrictions: No       Mobility Bed Mobility Overal bed mobility: Modified Independent Bed Mobility: Supine to Sit;Sit to Supine     Supine to sit: Max assist Sit to supine: Max assist   General bed mobility comments: assistance for B LEs and trunk  with mod multimodal cuing    Transfers Overall transfer level: Needs assistance Equipment used: Rolling walker (2 wheeled) Transfers: Sit to/from Stand Sit to Stand: Max assist         General transfer comment: Pt unable to stand from stand height bed and standing from elevated surface with max A    Balance Overall balance assessment: Needs assistance Sitting-balance support: Bilateral upper extremity supported;Feet supported Sitting balance-Leahy Scale: Good Sitting balance - Comments: no LOB   Standing balance support: No upper extremity supported;During functional activity Standing balance-Leahy Scale: Poor Standing balance comment: BUE on RW but no LOB noted                           ADL either performed or assessed with clinical judgement     Vision Patient Visual Report: No change from baseline            Cognition Arousal/Alertness: Awake/alert Behavior During Therapy: WFL for tasks assessed/performed Overall Cognitive Status: Impaired/Different from baseline Area of Impairment: Following commands;Safety/judgement;Awareness                       Following Commands: Follows one step commands consistently;Follows one step commands with increased time Safety/Judgement: Decreased awareness of safety;Decreased awareness of deficits     General Comments: increased time with mod - max multimodal cuing for sequencing and initiation.                   Pertinent Vitals/ Pain  Pain Assessment: No/denies pain   Frequency  Min 2X/week        Progress Toward Goals  OT Goals(current goals can now be found in the care plan section)  Progress towards OT goals: Progressing toward goals  Acute Rehab OT Goals Patient Stated Goal: to get better OT Goal Formulation: With patient Time For Goal Achievement: 11/25/20 Potential to Achieve Goals: Good  Plan Discharge plan needs to be updated;Frequency needs to be updated       AM-PAC  OT "6 Clicks" Daily Activity     Outcome Measure   Help from another person eating meals?: A Little Help from another person taking care of personal grooming?: A Little Help from another person toileting, which includes using toliet, bedpan, or urinal?: Total Help from another person bathing (including washing, rinsing, drying)?: A Lot Help from another person to put on and taking off regular upper body clothing?: A Little Help from another person to put on and taking off regular lower body clothing?: Total 6 Click Score: 13    End of Session Equipment Utilized During Treatment: Rolling walker  OT Visit Diagnosis: Other abnormalities of gait and mobility (R26.89);Muscle weakness (generalized) (M62.81)   Activity Tolerance Patient tolerated treatment well   Patient Left in chair;with call bell/phone within reach   Nurse Communication Mobility status        Time: 9163-8466 OT Time Calculation (min): 25 min  Charges: OT General Charges $OT Visit: 1 Visit OT Treatments $Therapeutic Activity: 23-37 mins  Jackquline Denmark, MS, OTR/L , CBIS ascom [phone]  11/13/20, 4:14 PM

## 2020-11-13 NOTE — Progress Notes (Signed)
Subjective:  CC: Brad Reed is a 86 y.o. male  Hospital stay day 5, 2 Days Post-Op robotic lap chole  HPI: No acute issues overnight. Tolerated clears this am. No change in pain.  ROS:  General: Denies weight loss, weight gain, fatigue, fevers, chills, and night sweats. Heart: Denies chest pain, palpitations, racing heart, irregular heartbeat, leg pain or swelling, and decreased activity tolerance. Respiratory: Denies breathing difficulty, shortness of breath, wheezing, cough, and sputum. GI: Denies change in appetite, heartburn, nausea, vomiting, constipation, diarrhea, and blood in stool. GU: Denies difficulty urinating, pain with urinating, urgency, frequency, blood in urine.   Objective:   Temp:  [97.8 F (36.6 C)-98.4 F (36.9 C)] 98.1 F (36.7 C) (07/01 1127) Pulse Rate:  [70-112] 80 (07/01 1127) Resp:  [17-20] 17 (07/01 1127) BP: (121-158)/(64-78) 158/77 (07/01 1127) SpO2:  [90 %-97 %] 97 % (07/01 1127) Weight:  [123.2 kg] 123.2 kg (07/01 0600)     Height: 5\' 10"  (177.8 cm) Weight: 123.2 kg BMI (Calculated): 38.97   Intake/Output this shift:   Intake/Output Summary (Last 24 hours) at 11/13/2020 1226 Last data filed at 11/13/2020 1129 Gross per 24 hour  Intake 1304.45 ml  Output 1255 ml  Net 49.45 ml    Constitutional :  alert, cooperative, appears stated age, and no distress  Respiratory:  clear to auscultation bilaterally  Cardiovascular:  regular rate and rhythm  Gastrointestinal: Soft, no guarding, JP with serosanguinous fluid  TTP in RUQ area.   Skin: Cool and moist. Incisions c/d/I.  Psychiatric: Normal affect, non-agitated, not confused       LABS:  CMP Latest Ref Rng & Units 11/13/2020 11/12/2020 11/11/2020  Glucose 70 - 99 mg/dL 96 11/13/2020) 494(W)  BUN 8 - 23 mg/dL 17 17 18   Creatinine 0.61 - 1.24 mg/dL 967(R 9.16  Sodium 135 - 145 mmol/L 138 138 136  Potassium 3.5 - 5.1 mmol/L 3.2(L) 3.7 4.1  Chloride 98 - 111 mmol/L 105 104 99  CO2 22 - 32 mmol/L 26  28 30   Calcium 8.9 - 10.3 mg/dL 8.0(L) 8.4(L) 9.1  Total Protein 6.5 - 8.1 g/dL - 5.4(L) 5.7(L)  Total Bilirubin 0.3 - 1.2 mg/dL - 3.3(H) 5.5(H)  Alkaline Phos 38 - 126 U/L - 93 101  AST 15 - 41 U/L - 56(H) 62(H)  ALT 0 - 44 U/L - 125(H) 154(H)   CBC Latest Ref Rng & Units 11/13/2020 11/12/2020 11/11/2020  WBC 4.0 - 10.5 K/uL 10.1 12.1(H) 7.7  Hemoglobin 13.0 - 17.0 g/dL 01/14/2021 11/14/2020 11/13/2020  Hematocrit 39.0 - 52.0 % 39.1 40.1 40.9  Platelets 150 - 400 K/uL 160 208 174    RADS: N/a Assessment:   S/p robotic lap chole.  Difficult case with extensive scarring and adhesions to duodenum causing injury.  Labs/vitals/clincial exam continue to be reassuring and tolerating clears.    ok to advance diet as tolerated. I switched him to augmentin. Cointue for 7days.  no need for fluids from my standpoint. once tolerating regular diet, ok to d/c from surgerystandpoint. f/u appt and d/c instructions in the system.   Discussed dispo with son who voiced concerns about self care issues with father.  Reassured him all d/c instructions and appointments are documentated in the system and I will pass word along to primary regrarding his concerns.  Case manager and primary team is asking for OT eval and likely SNF placement at this time.  Call Surgery for additional questions or concerns

## 2020-11-13 NOTE — Progress Notes (Signed)
   11/13/20 0920  What Happened  Was fall witnessed? Yes  Who witnessed fall? Wells Guiles , NT  Patients activity before fall bathroom-assisted  Point of contact buttocks  Was patient injured? Yes (Skin tear to left arm)  Follow Up  MD notified Zhang  Time MD notified 0930  Family notified Yes - comment Dreon Pineda , son)  Time family notified 1054  Simple treatment  (cleaned by Vida Roller)  Adult Fall Risk Assessment  Risk Factor Category (scoring not indicated) Fall has occurred during this admission (document High fall risk)  Patient Fall Risk Level High fall risk  Adult Fall Risk Interventions  Required Bundle Interventions *See Row Information* High fall risk - low, moderate, and high requirements implemented  Additional Interventions Use of appropriate toileting equipment (bedpan, BSC, etc.);Room near nurses station  Pain Assessment  Pain Scale 0-10  Pain Score 0

## 2020-11-13 NOTE — Care Management Important Message (Signed)
Important Message  Patient Details  Name: Brad Reed MRN: [id number] Date of Birth: [birth year]   Medicare Important Message Given:  Yes     [name] 11/13/2020, 3:35 PM

## 2020-11-13 NOTE — Progress Notes (Signed)
   11/13/20 0939  What Happened  Was fall witnessed? Yes  Who witnessed fall? Kelly,NT  Patients activity before fall bathroom-assisted  Point of contact buttocks (lowered to floor by staff)  Was patient injured? Yes  Adult Fall Risk Assessment  Risk Factor Category (scoring not indicated) Fall has occurred during this admission (document High fall risk)  Patient Fall Risk Level High fall risk  Adult Fall Risk Interventions  Required Bundle Interventions *See Row Information* High fall risk - low, moderate, and high requirements implemented  Additional Interventions Room near nurses station;Use of appropriate toileting equipment (bedpan, BSC, etc.)  Screening for Fall Injury Risk (To be completed on HIGH fall risk patients) - Assessing Need for Floor Mats  Risk For Fall Injury- Criteria for Floor Mats Previous fall this admission  Will Implement Floor Mats Yes  Vitals  BP 121/77  MAP (mmHg) 88  BP Location Left Arm  BP Method Automatic  Patient Position (if appropriate) Lying  Pulse Rate 90  Pulse Rate Source Monitor;Dinamap  Pain Assessment  Pain Scale 0-10  Pain Score 0  PCA/Epidural/Spinal Assessment  Respiratory Pattern Regular  Incentive Spirometry - Achieved (mL) (RN, NT, or RT) 1250 mL  Neurological  Neuro (WDL) X  Level of Consciousness Alert  Orientation Level Oriented X4  Cognition Follows commands  Speech Clear  Facial Symmetry Asymmetrical right (pt states history of same pts norm)  Facial Droop Right (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

## 2020-11-14 ENCOUNTER — Inpatient Hospital Stay: Payer: Medicare HMO

## 2020-11-14 LAB — CBC WITH DIFFERENTIAL/PLATELET
Abs Immature Granulocytes: 0.19 10*3/uL — ABNORMAL HIGH (ref 0.00–0.07)
Basophils Absolute: 0.1 10*3/uL (ref 0.0–0.1)
Basophils Relative: 1 %
Eosinophils Absolute: 0.3 10*3/uL (ref 0.0–0.5)
Eosinophils Relative: 3 %
HCT: 37.4 % — ABNORMAL LOW (ref 39.0–52.0)
Hemoglobin: 12.9 g/dL — ABNORMAL LOW (ref 13.0–17.0)
Immature Granulocytes: 2 %
Lymphocytes Relative: 17 %
Lymphs Abs: 1.7 10*3/uL (ref 0.7–4.0)
MCH: 33.7 pg (ref 26.0–34.0)
MCHC: 34.5 g/dL (ref 30.0–36.0)
MCV: 97.7 fL (ref 80.0–100.0)
Monocytes Absolute: 1 10*3/uL (ref 0.1–1.0)
Monocytes Relative: 10 %
Neutro Abs: 6.8 10*3/uL (ref 1.7–7.7)
Neutrophils Relative %: 67 %
Platelets: 174 10*3/uL (ref 150–400)
RBC: 3.83 MIL/uL — ABNORMAL LOW (ref 4.22–5.81)
RDW: 12.8 % (ref 11.5–15.5)
WBC: 10 10*3/uL (ref 4.0–10.5)
nRBC: 0 % (ref 0.0–0.2)

## 2020-11-14 LAB — BASIC METABOLIC PANEL
Anion gap: 7 (ref 5–15)
BUN: 12 mg/dL (ref 8–23)
CO2: 28 mmol/L (ref 22–32)
Calcium: 8.2 mg/dL — ABNORMAL LOW (ref 8.9–10.3)
Chloride: 105 mmol/L (ref 98–111)
Creatinine, Ser: 0.81 mg/dL (ref 0.61–1.24)
GFR, Estimated: >60 mL/min (ref >60)
Glucose, Bld: 104 mg/dL — ABNORMAL HIGH (ref 70–99)
Potassium: 3.2 mmol/L — ABNORMAL LOW (ref 3.5–5.1)
Sodium: 140 mmol/L (ref 135–145)

## 2020-11-14 LAB — CULTURE, BLOOD (ROUTINE X 2)
Culture: NO GROWTH
Culture: NO GROWTH

## 2020-11-14 LAB — PHOSPHORUS: Phosphorus: 1.9 mg/dL — ABNORMAL LOW (ref 2.5–4.6)

## 2020-11-14 LAB — METHYLMALONIC ACID, SERUM: Methylmalonic Acid, Quantitative: 556 nmol/L — ABNORMAL HIGH (ref 0–378)

## 2020-11-14 LAB — MAGNESIUM: Magnesium: 1.8 mg/dL (ref 1.7–2.4)

## 2020-11-14 MED ORDER — POTASSIUM PHOSPHATES 15 MMOLE/5ML IV SOLN
30.0000 mmol | Freq: Once | INTRAVENOUS | Status: AC
  Administered 2020-11-14: 30 mmol via INTRAVENOUS
  Filled 2020-11-14: qty 10

## 2020-11-14 MED ORDER — THIAMINE HCL 100 MG PO TABS
100.0000 mg | ORAL_TABLET | Freq: Every day | ORAL | Status: DC
  Administered 2020-11-15 – 2020-11-19 (×5): 100 mg via ORAL
  Filled 2020-11-14 (×5): qty 1

## 2020-11-14 MED ORDER — GUAIFENESIN ER 600 MG PO TB12
600.0000 mg | ORAL_TABLET | Freq: Two times a day (BID) | ORAL | Status: DC
  Administered 2020-11-14 – 2020-11-19 (×10): 600 mg via ORAL
  Filled 2020-11-14 (×10): qty 1

## 2020-11-14 MED ORDER — GERHARDT'S BUTT CREAM
TOPICAL_CREAM | Freq: Two times a day (BID) | CUTANEOUS | Status: DC
  Administered 2020-11-15 – 2020-11-19 (×2): 1 via TOPICAL
  Filled 2020-11-14: qty 1

## 2020-11-14 NOTE — Progress Notes (Signed)
Notified oncall provider that the has crackles in left lung fields and decreased urinary output, urine is amber, distant heart sounds, and patient has nonproductive cough.  Orders received.

## 2020-11-14 NOTE — Progress Notes (Signed)
PHARMACIST - PHYSICIAN COMMUNICATION  DR:   Chipper Herb  CONCERNING: IV to Oral Route Change Policy  RECOMMENDATION: This patient is receiving thiamine by the intravenous route.  Based on criteria approved by the Pharmacy and Therapeutics Committee, the intravenous medication(s) is/are being converted to the equivalent oral dose form(s).   DESCRIPTION: These criteria include: The patient is eating (either orally or via tube) and/or has been taking other orally administered medications for a least 24 hours The patient has no evidence of active gastrointestinal bleeding or impaired GI absorption (gastrectomy, short bowel, patient on TNA or NPO).  If you have questions about this conversion, please contact the Pharmacy Department  []   [phone] )  ( [phone] [x]   [phone] )  [facility] []   [phone] )  [facility] []   [phone] )  [facility] []   [phone] )  [facility]   ( [phone], [facility] 11/14/2020 2:16 PM

## 2020-11-14 NOTE — Progress Notes (Addendum)
PROGRESS NOTE    Brad HerbKenneth J Reed  ZOX:[id number] DOB: 09/09/1934 DOA: 11/08/2020 PCP: Karie SchwalbeLetvak, Richard I, MD   CC; Nausea/vomiting Brief Narrative:  Brad Reed is a 86 y.o. male with medical history significant for essential hypertension, diverticulosis, who is admitted to [facility] on 11/08/2020 with severe sepsis in the setting of acute transaminitis after presenting from home to [facility] ED complaining of nausea/vomiting.  Patient states that that he had a liver abscess a year ago which has since resolved. Upon arrival to emergency room, patient had a significant jaundice with a bilirubin of 8.4, MRCP showed stone in the common bile duct.  Patient has been evaluated by GI, successful ERCP on 6/28. Cholecystectomy was performed on 6/29, showed a significant inflammation and damage to the stomach and duodenum due to adhesion from inflamed gallbladder.   Assessment & Plan:   Principal Problem:   Adjustment disorder with mixed emotional features Active Problems:   Essential hypertension, benign   Severe sepsis (HCC)   Transaminitis   Nausea & vomiting   Generalized weakness   Lactic acidosis   Choledocholithiasis   Acute cholecystitis  #1.  Severe sepsis. Choledocholithiasis with cholecystitis. Status post ERCP, Duodenal repair and cholecystectomy. Condition had improved.  General surgery had change antibiotic to oral for 7 days.  #2.  Generalized weakness. PT/OT.  Patient will be pending nursing home placement.  3.  Chronic alcohol abuse. Hypokalemia Hypophosphatemia. Still stable, no alcohol withdrawal.  Will give IV potassium phosphate.    DVT prophylaxis: Lovenox Code Status: DNR Family Communication:  Disposition Plan:    Status is: Inpatient  Remains inpatient appropriate because:Inpatient level of care appropriate due to severity of illness  Dispo: The patient is from: Home              Anticipated d/c is to: SNF              Patient  currently is not medically stable to d/c.   Difficult to place patient No        I/O last 3 completed shifts: In: 2106.3 [P.O.:1440; I.V.:362.9; IV Piggyback:303.4] Out: 2105 [Urine:1825; Drains:280] Total I/O In: 240 [P.O.:240] Out: 50 [Urine:50]     Consultants:  GS  Procedures: Cholecystectomy and ERCP.  Antimicrobials: Augmentin.  Subjective: Patient still has signal weakness, otherwise condition had improved.   Diet advanced per surgery, no nausea vomiting abdominal pain. No short of breath or cough. No dysuria hematuria No fever or chills.  Objective: Vitals:   11/14/20 0446 11/14/20 0503 11/14/20 0819 11/14/20 1208  BP: (!) 149/75  (!) 150/78 (!) 143/90  Pulse: 71  69 78  Resp: 18  18 18   Temp: 98.9 F (37.2 C)  98.3 F (36.8 C) 98.2 F (36.8 C)  TempSrc:    Oral  SpO2: 92%  94% 96%  Weight:  126 kg    Height:        Intake/Output Summary (Last 24 hours) at 11/14/2020 1313 Last data filed at 11/14/2020 1030 Gross per 24 hour  Intake 1616.74 ml  Output 980 ml  Net 636.74 ml   Filed Weights   11/12/20 0500 11/13/20 0600 11/14/20 0503  Weight: 125 kg 123.2 kg 126 kg    Examination:  General exam: Appears calm and comfortable  Respiratory system: Clear to auscultation. Respiratory effort normal. Cardiovascular system: S1 & S2 heard, RRR. No JVD, murmurs, rubs, gallops or clicks. No pedal edema. Gastrointestinal system: Abdomen is nondistended, soft and nontender. No organomegaly  or masses felt. Normal bowel sounds heard. Central nervous system: Alert and oriented x3. No focal neurological deficits. Extremities: Symmetric 5 x 5 power. Skin: No rashes, lesions or ulcers Psychiatry: Mood & affect appropriate.     Data Reviewed: I have personally reviewed following labs and imaging studies  CBC: Recent Labs  Lab 11/10/20 0650 11/11/20 0705 11/12/20 0643 11/13/20 0602 11/14/20 0631  WBC 10.5 7.7 12.1* 10.1 10.0  NEUTROABS 8.5* 6.5 10.4* 7.6  6.8  HGB 14.1 14.3 13.7 13.5 12.9*  HCT 40.5 40.9 40.1 39.1 37.4*  MCV 95.3 95.1 97.1 97.5 97.7  PLT 146* 174 208 160 174   Basic Metabolic Panel: Recent Labs  Lab 11/08/20 1810 11/09/20 0702 11/10/20 0650 11/11/20 0705 11/12/20 0643 11/13/20 0602 11/14/20 0631  NA 137 139 139 136 138 138 140  K 3.5 3.0* 3.2* 4.1 3.7 3.2* 3.2*  CL 102 104 102 99 104 105 105  CO2 GLUCOSE 128* 113* 96 181* 133* 96 104*  BUN CREATININE 1.04 0.74 0.77 1.04 0.91 0.95 0.81  CALCIUM 8.9 8.6* 8.5* 9.1 8.4* 8.0* 8.2*  MG 1.8 2.0  --   --  2.1 1.8 1.8  PHOS  --  3.1  --   --   --   --  1.9*   GFR: Estimated Creatinine Clearance: 90.5 mL/min (by C-G formula based on SCr of 0.81 mg/dL). Liver Function Tests: Recent Labs  Lab 11/08/20 1810 11/09/20 0702 11/10/20 0650 11/11/20 0705 11/12/20 0643  AST 784* 367* 136* 62* 56*  ALT 476* 376* 227* 154* 125*  ALKPHOS 132* 112 108 101 93  BILITOT 4.2* 8.1* 9.4* 5.5* 3.3*  PROT 6.4* 5.9* 5.6* 5.7* 5.4*  ALBUMIN 3.2* 3.0* 2.8* 2.6* 2.5*   Recent Labs  Lab 11/08/20 1810  LIPASE 29   No results for input(s): AMMONIA in the last 168 hours. Coagulation Profile: Recent Labs  Lab 11/08/20 1810 11/09/20 0702  INR 1.2 1.3*   Cardiac Enzymes: No results for input(s): CKTOTAL, CKMB, CKMBINDEX, TROPONINI in the last 168 hours. BNP (last 3 results) No results for input(s): PROBNP in the last 8760 hours. HbA1C: No results for input(s): HGBA1C in the last 72 hours. CBG: No results for input(s): GLUCAP in the last 168 hours. Lipid Profile: No results for input(s): CHOL, HDL, LDLCALC, TRIG, CHOLHDL, LDLDIRECT in the last 72 hours. Thyroid Function Tests: No results for input(s): TSH, T4TOTAL, FREET4, T3FREE, THYROIDAB in the last 72 hours. Anemia Panel: No results for input(s): VITAMINB12, FOLATE, FERRITIN, TIBC, IRON, RETICCTPCT in the last 72 hours. Sepsis Labs: Recent Labs  Lab 11/08/20 1810  11/08/20 1848 11/08/20 2104  PROCALCITON 0.88  --   --   LATICACIDVEN  --  2.3* 1.8    Recent Results (from the past 240 hour(s))  Resp Panel by RT-PCR (Flu A&B, Covid) Nasopharyngeal Swab     Status: None   Collection Time: 11/08/20  6:17 PM   Specimen: Nasopharyngeal Swab; Nasopharyngeal(NP) swabs in vial transport medium  Result Value Ref Range Status   SARS Coronavirus 2 by RT PCR NEGATIVE NEGATIVE Final    Comment: (NOTE) SARS-CoV-2 target nucleic acids are NOT DETECTED.  The SARS-CoV-2 RNA is generally detectable in upper respiratory specimens during the acute phase of infection. The lowest concentration of SARS-CoV-2 viral copies this assay can detect is 138 copies/mL. A negative result does not preclude SARS-Cov-2 infection and should not be used  as the sole basis for treatment or other patient management decisions. A negative result may occur with  improper specimen collection/handling, submission of specimen other than nasopharyngeal swab, presence of viral mutation(s) within the areas targeted by this assay, and inadequate number of viral copies(<138 copies/mL). A negative result must be combined with clinical observations, patient history, and epidemiological information. The expected result is Negative.  Fact Sheet for Patients:  BloggerCourse.com  Fact Sheet for Healthcare Providers:  SeriousBroker.it  This test is no t yet approved or cleared by the [place] FDA and  has been authorized for detection and/or diagnosis of SARS-CoV-2 by FDA under an Emergency Use Authorization (EUA). This EUA will remain  in effect (meaning this test can be used) for the duration of the COVID-19 declaration under Section 564(b)(1) of the Act, 21 U.S.C.section 360bbb-3(b)(1), unless the authorization is terminated  or revoked sooner.       Influenza A by PCR NEGATIVE NEGATIVE Final   Influenza B by PCR NEGATIVE NEGATIVE  Final    Comment: (NOTE) The Xpert Xpress SARS-CoV-2/FLU/RSV plus assay is intended as an aid in the diagnosis of influenza from Nasopharyngeal swab specimens and should not be used as a sole basis for treatment. Nasal washings and aspirates are unacceptable for Xpert Xpress SARS-CoV-2/FLU/RSV testing.  Fact Sheet for Patients: BloggerCourse.com  Fact Sheet for Healthcare Providers: SeriousBroker.it  This test is not yet approved or cleared by the [place] FDA and has been authorized for detection and/or diagnosis of SARS-CoV-2 by FDA under an Emergency Use Authorization (EUA). This EUA will remain in effect (meaning this test can be used) for the duration of the COVID-19 declaration under Section 564(b)(1) of the Act, 21 U.S.C. section 360bbb-3(b)(1), unless the authorization is terminated or revoked.  Performed at [facility], [address] [place], [place] [address]   Blood culture (routine single)     Status: Abnormal   Collection Time: 11/08/20  6:48 PM   Specimen: BLOOD  Result Value Ref Range Status   Specimen Description   Final    BLOOD  RT [facility] Performed at [facility], [address] [place], [place] [address]    Special Requests   Final    BOTTLES DRAWN AEROBIC AND ANAEROBIC Blood Culture adequate volume Performed at [facility], [address] [place], [place] [address]    Culture  Setup Time   Final    IN BOTH AEROBIC AND ANAEROBIC BOTTLES GRAM POSITIVE COCCI CRITICAL RESULT CALLED TO, READ BACK BY AND VERIFIED WITH: MORGAN HICKS 11/09/20 1255 KLW    Culture (A)  Final    STAPHYLOCOCCUS EPIDERMIDIS THE SIGNIFICANCE OF ISOLATING THIS ORGANISM FROM A SINGLE SET OF BLOOD CULTURES WHEN MULTIPLE SETS ARE DRAWN IS UNCERTAIN. PLEASE NOTIFY THE MICROBIOLOGY DEPARTMENT WITHIN ONE WEEK IF SPECIATION AND SENSITIVITIES ARE REQUIRED. Performed at [facility], [address] [place], [place] [address]    Report Status 11/11/2020 FINAL  Final  Blood Culture ID Panel (Reflexed)     Status: Abnormal   Collection Time: 11/08/20  6:48 PM  Result Value Ref Range Status   Enterococcus faecalis NOT DETECTED NOT DETECTED Final   Enterococcus Faecium NOT DETECTED NOT DETECTED Final   Listeria monocytogenes NOT DETECTED NOT DETECTED Final   Staphylococcus species DETECTED (A) NOT DETECTED Final    Comment: CRITICAL RESULT CALLED TO, READ BACK BY AND VERIFIED WITH: MORGAN HICKS 11/09/20 1255 KLW    Staphylococcus aureus (BCID) NOT DETECTED NOT  DETECTED Final   Staphylococcus epidermidis DETECTED (A) NOT DETECTED Final    Comment: Methicillin (oxacillin) resistant coagulase negative staphylococcus. Possible blood culture contaminant (unless isolated from more than one blood culture draw or clinical case suggests pathogenicity). No antibiotic treatment is indicated for blood  culture contaminants. CRITICAL RESULT CALLED TO, READ BACK BY AND VERIFIED WITH: MORGAN HICKS 11/09/20 1255 KLW    Staphylococcus lugdunensis NOT DETECTED NOT DETECTED Final   Streptococcus species NOT DETECTED NOT DETECTED Final   Streptococcus agalactiae NOT DETECTED NOT DETECTED Final   Streptococcus pneumoniae NOT DETECTED NOT DETECTED Final   Streptococcus pyogenes NOT DETECTED NOT DETECTED Final   A.calcoaceticus-baumannii NOT DETECTED NOT DETECTED Final   Bacteroides fragilis NOT DETECTED NOT DETECTED Final   Enterobacterales NOT DETECTED NOT DETECTED Final   Enterobacter cloacae complex NOT DETECTED NOT DETECTED Final   Escherichia coli NOT DETECTED NOT DETECTED Final   Klebsiella aerogenes NOT DETECTED NOT DETECTED Final   Klebsiella oxytoca NOT DETECTED NOT DETECTED Final   Klebsiella pneumoniae NOT DETECTED NOT DETECTED Final   Proteus species NOT DETECTED NOT DETECTED Final   Salmonella species NOT DETECTED NOT DETECTED Final   Serratia marcescens NOT DETECTED NOT DETECTED Final    Haemophilus influenzae NOT DETECTED NOT DETECTED Final   Neisseria meningitidis NOT DETECTED NOT DETECTED Final   Pseudomonas aeruginosa NOT DETECTED NOT DETECTED Final   Stenotrophomonas maltophilia NOT DETECTED NOT DETECTED Final   Candida albicans NOT DETECTED NOT DETECTED Final   Candida auris NOT DETECTED NOT DETECTED Final   Candida glabrata NOT DETECTED NOT DETECTED Final   Candida krusei NOT DETECTED NOT DETECTED Final   Candida parapsilosis NOT DETECTED NOT DETECTED Final   Candida tropicalis NOT DETECTED NOT DETECTED Final   Cryptococcus neoformans/gattii NOT DETECTED NOT DETECTED Final   Methicillin resistance mecA/C DETECTED (A) NOT DETECTED Final    Comment: CRITICAL RESULT CALLED TO, READ BACK BY AND VERIFIED WITH: MORGAN HICKS 11/09/20 1255 KLW Performed at [facility], [address] [place], [place] [address]   Urine culture     Status: Abnormal   Collection Time: 11/08/20  9:53 PM   Specimen: In/Out Cath Urine  Result Value Ref Range Status   Specimen Description   Final    IN/OUT CATH URINE Performed at [facility], [address] [place], [place] [address]    Special Requests   Final    NONE Performed at [facility], [address] [place], [place] [address]    Culture MULTIPLE SPECIES PRESENT, SUGGEST RECOLLECTION (A)  Final   Report Status 11/10/2020 FINAL  Final  CULTURE, BLOOD (ROUTINE X 2) w Reflex to ID Panel     Status: None   Collection Time: 11/09/20  2:22 PM   Specimen: BLOOD RIGHT HAND  Result Value Ref Range Status   Specimen Description BLOOD RIGHT HAND  Final   Special Requests   Final    BOTTLES DRAWN AEROBIC AND ANAEROBIC Blood Culture results may not be optimal due to an inadequate volume of blood received in culture bottles   Culture   Final    NO GROWTH 5 DAYS Performed at [facility], [address] [place], [place] [address]    Report Status 11/14/2020 FINAL  Final  CULTURE, BLOOD  (ROUTINE X 2) w Reflex to ID Panel     Status: None   Collection Time: 11/09/20  2:22 PM   Specimen: BLOOD LEFT HAND  Result Value Ref Range Status  Specimen Description BLOOD LEFT HAND  Final   Special Requests   Final    BOTTLES DRAWN AEROBIC AND ANAEROBIC Blood Culture results may not be optimal due to an inadequate volume of blood received in culture bottles   Culture   Final    NO GROWTH 5 DAYS Performed at [facility], [address] [place], [place] [address]    Report Status 11/14/2020 FINAL  Final         Radiology Studies: CT HEAD WO CONTRAST  Result Date: 11/13/2020 CLINICAL DATA:  Right facial droop EXAM: CT HEAD WITHOUT CONTRAST TECHNIQUE: Contiguous axial images were obtained from the base of the skull through the vertex without intravenous contrast. COMPARISON:  CT 11/08/2020 FINDINGS: Brain: No acute territorial infarction, hemorrhage, or intracranial advanced atrophy. Moderate chronic small vessel ischemic changes of the white matter. Multiple probably chronic bilateral basal ganglial lacunar infarcts. Stable ventricle size. Vascular: No hyperdense vessels.  Carotid vascular calcification Skull: Negative for fracture or focal lesion. Sinuses/Orbits: Mucosal thickening in the sinuses Other: None IMPRESSION: 1. No CT evidence for acute intracranial abnormality. 2. Atrophy and chronic small vessel ischemic changes of the white matter. Multiple chronic appearing lacunar infarcts in the basal ganglia. Electronically Signed   By: Jasmine Pang M.D.   On: 11/13/2020 17:44        Scheduled Meds:  amoxicillin-clavulanate  1 tablet Oral Q12H   enoxaparin (LOVENOX) injection  0.5 mg/kg Subcutaneous Q24H   thiamine injection  100 mg Intravenous Daily   Continuous Infusions:  potassium PHOSPHATE IVPB (in mmol) 30 mmol (11/14/20 0942)     LOS: 6 days    Time spent: 28 minutes    Marrion Coy, MD [facility]   To contact the attending provider  between 7A-7P or the covering provider during after hours 7P-7A, please log into the web site [email or web address] and access using universal [place] password for that web site. If you do not have the password, please call the hospital operator.  11/14/2020, 1:13 PM

## 2020-11-15 LAB — BASIC METABOLIC PANEL
Anion gap: 5 (ref 5–15)
BUN: 11 mg/dL (ref 8–23)
CO2: 31 mmol/L (ref 22–32)
Calcium: 8.4 mg/dL — ABNORMAL LOW (ref 8.9–10.3)
Chloride: 102 mmol/L (ref 98–111)
Creatinine, Ser: 0.93 mg/dL (ref 0.61–1.24)
GFR, Estimated: >60 mL/min (ref >60)
Glucose, Bld: 106 mg/dL — ABNORMAL HIGH (ref 70–99)
Potassium: 3.3 mmol/L — ABNORMAL LOW (ref 3.5–5.1)
Sodium: 138 mmol/L (ref 135–145)

## 2020-11-15 LAB — PHOSPHORUS: Phosphorus: 2.7 mg/dL (ref 2.5–4.6)

## 2020-11-15 LAB — MAGNESIUM: Magnesium: 1.9 mg/dL (ref 1.7–2.4)

## 2020-11-15 MED ORDER — POTASSIUM CHLORIDE 10 MEQ/100ML IV SOLN
10.0000 meq | INTRAVENOUS | Status: AC
  Administered 2020-11-15 (×4): 10 meq via INTRAVENOUS
  Filled 2020-11-15 (×4): qty 100

## 2020-11-15 NOTE — Progress Notes (Signed)
PROGRESS NOTE    Brad Reed  IOE:[id number] DOB: 1934/09/04 DOA: 11/08/2020 PCP: Karie Schwalbe, MD   Chief complaint nausea vomiting. Brief Narrative:  Brad Reed is a 86 y.o. male with medical history significant for essential hypertension, diverticulosis, who is admitted to [facility] on 11/08/2020 with severe sepsis in the setting of acute transaminitis after presenting from home to [facility] ED complaining of nausea/vomiting.  Patient states that that he had a liver abscess a year ago which has since resolved. Upon arrival to emergency room, patient had a significant jaundice with a bilirubin of 8.4, MRCP showed stone in the common bile duct.  Patient has been evaluated by GI, successful ERCP on 6/28. Cholecystectomy was performed on 6/29, showed a significant inflammation and damage to the stomach and duodenum due to adhesion from inflamed gallbladder   Assessment & Plan:   Principal Problem:   Adjustment disorder with mixed emotional features Active Problems:   Essential hypertension, benign   Severe sepsis (HCC)   Transaminitis   Nausea & vomiting   Generalized weakness   Lactic acidosis   Choledocholithiasis   Acute cholecystitis  #1.  Severe sepsis. Choledocholithiasis with cholecystitis. Status post ERCP, Duodenal repair and cholecystectomy. Patient condition had improved, currently on oral antibiotics. Pending SNF placement.  2.  Generalized weakness. Continue PT/OT.  3.  Chronic alcohol abuse. Hypokalemia Hypophosphatemia. No evidence of alcohol withdrawal. Continue give potassium chloride.    DVT prophylaxis: Lovnoex Code Status: DNR Family Communication:  Disposition Plan:    Status is: Inpatient  Remains inpatient appropriate because:Inpatient level of care appropriate due to severity of illness  Dispo: The patient is from: Home              Anticipated d/c is to: SNF              Patient currently is not medically  stable to d/c.   Difficult to place patient No        I/O last 3 completed shifts: In: 1440 [P.O.:1440] Out: 710 [Urine:600; Drains:110] Total I/O In: 360 [P.O.:360] Out: -      Consultants:  General surgery  Procedures:   Antimicrobials:  Augmentin.  Subjective: Patient doing better today.  He slept well last night.  He was able to sit in the chair yesterday, he was able to stand with nurse. Denies any short of breath or cough. No abdominal pain nausea vomiting. No dysuria hematuria No fever or chills.  Objective: Vitals:   11/14/20 1935 11/15/20 0348 11/15/20 0538 11/15/20 0758  BP: (!) 156/81 (!) 143/67  (!) 153/86  Pulse: 66 65  62  Resp: (!) 22 20  18   Temp: 98.2 F (36.8 C) 97.6 F (36.4 C)  98.1 F (36.7 C)  TempSrc:      SpO2: 95% 94%  97%  Weight:   123.9 kg   Height:        Intake/Output Summary (Last 24 hours) at 11/15/2020 1031 Last data filed at 11/15/2020 1000 Gross per 24 hour  Intake 1320 ml  Output 280 ml  Net 1040 ml   Filed Weights   11/13/20 0600 11/14/20 0503 11/15/20 0538  Weight: 123.2 kg 126 kg 123.9 kg    Examination:  General exam: Appears calm and comfortable  Respiratory system: Clear to auscultation. Respiratory effort normal. Cardiovascular system: S1 & S2 heard, RRR. No JVD, murmurs, rubs, gallops or clicks. No pedal edema. Gastrointestinal system: Abdomen is nondistended, soft and nontender. No  organomegaly or masses felt. Normal bowel sounds heard. Central nervous system: Alert and oriented. No focal neurological deficits. Extremities: Symmetric 5 x 5 power. Skin: No rashes, lesions or ulcers Psychiatry: Judgement and insight appear normal. Mood & affect appropriate.     Data Reviewed: I have personally reviewed following labs and imaging studies  CBC: Recent Labs  Lab 11/10/20 0650 11/11/20 0705 11/12/20 0643 11/13/20 0602 11/14/20 0631  WBC 10.5 7.7 12.1* 10.1 10.0  NEUTROABS 8.5* 6.5 10.4* 7.6 6.8   HGB 14.1 14.3 13.7 13.5 12.9*  HCT 40.5 40.9 40.1 39.1 37.4*  MCV 95.3 95.1 97.1 97.5 97.7  PLT 146* 174 208 160 174   Basic Metabolic Panel: Recent Labs  Lab 11/09/20 0702 11/10/20 0650 11/11/20 0705 11/12/20 0643 11/13/20 0602 11/14/20 0631 11/15/20 0635  NA 139   < > 136 138 138 140 138  K 3.0*   < > 4.1 3.7 3.2* 3.2* 3.3*  CL 104   < > 99 104 105 105 102  CO2 28   < > 30 28 26 28 31   GLUCOSE 113*   < > 181* 133* 96 104* 106*  BUN 14   < > 18 17 17 12 11   CREATININE 0.74   < > 1.04 0.91 0.95 0.81 0.93  CALCIUM 8.6*   < > 9.1 8.4* 8.0* 8.2* 8.4*  MG 2.0  --   --  2.1 1.8 1.8 1.9  PHOS 3.1  --   --   --   --  1.9* 2.7   < > = values in this interval not displayed.   GFR: Estimated Creatinine Clearance: 78.1 mL/min (by C-G formula based on SCr of 0.93 mg/dL). Liver Function Tests: Recent Labs  Lab 11/08/20 1810 11/09/20 0702 11/10/20 0650 11/11/20 0705 11/12/20 0643  AST 784* 367* 136* 62* 56*  ALT 476* 376* 227* 154* 125*  ALKPHOS 132* 112 108 101 93  BILITOT 4.2* 8.1* 9.4* 5.5* 3.3*  PROT 6.4* 5.9* 5.6* 5.7* 5.4*  ALBUMIN 3.2* 3.0* 2.8* 2.6* 2.5*   Recent Labs  Lab 11/08/20 1810  LIPASE 29   No results for input(s): AMMONIA in the last 168 hours. Coagulation Profile: Recent Labs  Lab 11/08/20 1810 11/09/20 0702  INR 1.2 1.3*   Cardiac Enzymes: No results for input(s): CKTOTAL, CKMB, CKMBINDEX, TROPONINI in the last 168 hours. BNP (last 3 results) No results for input(s): PROBNP in the last 8760 hours. HbA1C: No results for input(s): HGBA1C in the last 72 hours. CBG: No results for input(s): GLUCAP in the last 168 hours. Lipid Profile: No results for input(s): CHOL, HDL, LDLCALC, TRIG, CHOLHDL, LDLDIRECT in the last 72 hours. Thyroid Function Tests: No results for input(s): TSH, T4TOTAL, FREET4, T3FREE, THYROIDAB in the last 72 hours. Anemia Panel: No results for input(s): VITAMINB12, FOLATE, FERRITIN, TIBC, IRON, RETICCTPCT in the last 72  hours. Sepsis Labs: Recent Labs  Lab 11/08/20 1810 11/08/20 1848 11/08/20 2104  PROCALCITON 0.88  --   --   LATICACIDVEN  --  2.3* 1.8    Recent Results (from the past 240 hour(s))  Resp Panel by RT-PCR (Flu A&B, Covid) Nasopharyngeal Swab     Status: None   Collection Time: 11/08/20  6:17 PM   Specimen: Nasopharyngeal Swab; Nasopharyngeal(NP) swabs in vial transport medium  Result Value Ref Range Status   SARS Coronavirus 2 by RT PCR NEGATIVE NEGATIVE Final    Comment: (NOTE) SARS-CoV-2 target nucleic acids are NOT DETECTED.  The SARS-CoV-2 RNA is generally  detectable in upper respiratory specimens during the acute phase of infection. The lowest concentration of SARS-CoV-2 viral copies this assay can detect is 138 copies/mL. A negative result does not preclude SARS-Cov-2 infection and should not be used as the sole basis for treatment or other patient management decisions. A negative result may occur with  improper specimen collection/handling, submission of specimen other than nasopharyngeal swab, presence of viral mutation(s) within the areas targeted by this assay, and inadequate number of viral copies(<138 copies/mL). A negative result must be combined with clinical observations, patient history, and epidemiological information. The expected result is Negative.  Fact Sheet for Patients:  BloggerCourse.com  Fact Sheet for Healthcare Providers:  SeriousBroker.it  This test is no t yet approved or cleared by the [place] FDA and  has been authorized for detection and/or diagnosis of SARS-CoV-2 by FDA under an Emergency Use Authorization (EUA). This EUA will remain  in effect (meaning this test can be used) for the duration of the COVID-19 declaration under Section 564(b)(1) of the Act, 21 U.S.C.section 360bbb-3(b)(1), unless the authorization is terminated  or revoked sooner.       Influenza A by PCR NEGATIVE  NEGATIVE Final   Influenza B by PCR NEGATIVE NEGATIVE Final    Comment: (NOTE) The Xpert Xpress SARS-CoV-2/FLU/RSV plus assay is intended as an aid in the diagnosis of influenza from Nasopharyngeal swab specimens and should not be used as a sole basis for treatment. Nasal washings and aspirates are unacceptable for Xpert Xpress SARS-CoV-2/FLU/RSV testing.  Fact Sheet for Patients: BloggerCourse.com  Fact Sheet for Healthcare Providers: SeriousBroker.it  This test is not yet approved or cleared by the [place] FDA and has been authorized for detection and/or diagnosis of SARS-CoV-2 by FDA under an Emergency Use Authorization (EUA). This EUA will remain in effect (meaning this test can be used) for the duration of the COVID-19 declaration under Section 564(b)(1) of the Act, 21 U.S.C. section 360bbb-3(b)(1), unless the authorization is terminated or revoked.  Performed at [facility], [address] [place], [place] [address]   Blood culture (routine single)     Status: Abnormal   Collection Time: 11/08/20  6:48 PM   Specimen: BLOOD  Result Value Ref Range Status   Specimen Description   Final    BLOOD  RT [facility] Performed at [facility] [place] [facility], [address], [place], [place] [address]    Special Requests   Final    BOTTLES DRAWN AEROBIC AND ANAEROBIC Blood Culture adequate volume Performed at [facility], [address] [place], [place] [address]    Culture  Setup Time   Final    IN BOTH AEROBIC AND ANAEROBIC BOTTLES GRAM POSITIVE COCCI CRITICAL RESULT CALLED TO, READ BACK BY AND VERIFIED WITH: MORGAN HICKS 11/09/20 1255 KLW    Culture (A)  Final    STAPHYLOCOCCUS EPIDERMIDIS THE SIGNIFICANCE OF ISOLATING THIS ORGANISM FROM A SINGLE SET OF BLOOD CULTURES WHEN MULTIPLE SETS ARE DRAWN IS UNCERTAIN. PLEASE NOTIFY THE MICROBIOLOGY DEPARTMENT WITHIN ONE WEEK IF SPECIATION AND SENSITIVITIES ARE  REQUIRED. Performed at [facility], [address], [place], [place] [address]    Report Status 11/11/2020 FINAL  Final  Blood Culture ID Panel (Reflexed)     Status: Abnormal   Collection Time: 11/08/20  6:48 PM  Result Value Ref Range Status   Enterococcus faecalis NOT DETECTED NOT DETECTED Final   Enterococcus Faecium NOT DETECTED NOT DETECTED Final   Listeria monocytogenes NOT DETECTED NOT DETECTED  Final   Staphylococcus species DETECTED (A) NOT DETECTED Final    Comment: CRITICAL RESULT CALLED TO, READ BACK BY AND VERIFIED WITH: MORGAN HICKS 11/09/20 1255 KLW    Staphylococcus aureus (BCID) NOT DETECTED NOT DETECTED Final   Staphylococcus epidermidis DETECTED (A) NOT DETECTED Final    Comment: Methicillin (oxacillin) resistant coagulase negative staphylococcus. Possible blood culture contaminant (unless isolated from more than one blood culture draw or clinical case suggests pathogenicity). No antibiotic treatment is indicated for blood  culture contaminants. CRITICAL RESULT CALLED TO, READ BACK BY AND VERIFIED WITH: MORGAN HICKS 11/09/20 1255 KLW    Staphylococcus lugdunensis NOT DETECTED NOT DETECTED Final   Streptococcus species NOT DETECTED NOT DETECTED Final   Streptococcus agalactiae NOT DETECTED NOT DETECTED Final   Streptococcus pneumoniae NOT DETECTED NOT DETECTED Final   Streptococcus pyogenes NOT DETECTED NOT DETECTED Final   A.calcoaceticus-baumannii NOT DETECTED NOT DETECTED Final   Bacteroides fragilis NOT DETECTED NOT DETECTED Final   Enterobacterales NOT DETECTED NOT DETECTED Final   Enterobacter cloacae complex NOT DETECTED NOT DETECTED Final   Escherichia coli NOT DETECTED NOT DETECTED Final   Klebsiella aerogenes NOT DETECTED NOT DETECTED Final   Klebsiella oxytoca NOT DETECTED NOT DETECTED Final   Klebsiella pneumoniae NOT DETECTED NOT DETECTED Final   Proteus species NOT DETECTED NOT DETECTED Final   Salmonella species NOT DETECTED NOT DETECTED Final    Serratia marcescens NOT DETECTED NOT DETECTED Final   Haemophilus influenzae NOT DETECTED NOT DETECTED Final   Neisseria meningitidis NOT DETECTED NOT DETECTED Final   Pseudomonas aeruginosa NOT DETECTED NOT DETECTED Final   Stenotrophomonas maltophilia NOT DETECTED NOT DETECTED Final   Candida albicans NOT DETECTED NOT DETECTED Final   Candida auris NOT DETECTED NOT DETECTED Final   Candida glabrata NOT DETECTED NOT DETECTED Final   Candida krusei NOT DETECTED NOT DETECTED Final   Candida parapsilosis NOT DETECTED NOT DETECTED Final   Candida tropicalis NOT DETECTED NOT DETECTED Final   Cryptococcus neoformans/gattii NOT DETECTED NOT DETECTED Final   Methicillin resistance mecA/C DETECTED (A) NOT DETECTED Final    Comment: CRITICAL RESULT CALLED TO, READ BACK BY AND VERIFIED WITH: MORGAN HICKS 11/09/20 1255 KLW Performed at [facility], [address] [place], [place] [address]   Urine culture     Status: Abnormal   Collection Time: 11/08/20  9:53 PM   Specimen: In/Out Cath Urine  Result Value Ref Range Status   Specimen Description   Final    IN/OUT CATH URINE Performed at [facility], [address] [place], [place] [address]    Special Requests   Final    NONE Performed at [facility], [address] [place], [place] [address]    Culture MULTIPLE SPECIES PRESENT, SUGGEST RECOLLECTION (A)  Final   Report Status 11/10/2020 FINAL  Final  CULTURE, BLOOD (ROUTINE X 2) w Reflex to ID Panel     Status: None   Collection Time: 11/09/20  2:22 PM   Specimen: BLOOD RIGHT HAND  Result Value Ref Range Status   Specimen Description BLOOD RIGHT HAND  Final   Special Requests   Final    BOTTLES DRAWN AEROBIC AND ANAEROBIC Blood Culture results may not be optimal due to an inadequate volume of blood received in culture bottles   Culture   Final    NO GROWTH 5 DAYS Performed at [facility], [address] [place], [place] [address]     Report Status 11/14/2020 FINAL  Final  CULTURE, BLOOD (ROUTINE X 2) w Reflex to ID Panel     Status: None   Collection Time: 11/09/20  2:22 PM   Specimen: BLOOD LEFT HAND  Result Value Ref Range Status   Specimen Description BLOOD LEFT HAND  Final   Special Requests   Final    BOTTLES DRAWN AEROBIC AND ANAEROBIC Blood Culture results may not be optimal due to an inadequate volume of blood received in culture bottles   Culture   Final    NO GROWTH 5 DAYS Performed at [facility], [address] [place], [place] [address]    Report Status 11/14/2020 FINAL  Final         Radiology Studies: CT HEAD WO CONTRAST  Result Date: 11/13/2020 CLINICAL DATA:  Right facial droop EXAM: CT HEAD WITHOUT CONTRAST TECHNIQUE: Contiguous axial images were obtained from the base of the skull through the vertex without intravenous contrast. COMPARISON:  CT 11/08/2020 FINDINGS: Brain: No acute territorial infarction, hemorrhage, or intracranial advanced atrophy. Moderate chronic small vessel ischemic changes of the white matter. Multiple probably chronic bilateral basal ganglial lacunar infarcts. Stable ventricle size. Vascular: No hyperdense vessels.  Carotid vascular calcification Skull: Negative for fracture or focal lesion. Sinuses/Orbits: Mucosal thickening in the sinuses Other: None IMPRESSION: 1. No CT evidence for acute intracranial abnormality. 2. Atrophy and chronic small vessel ischemic changes of the white matter. Multiple chronic appearing lacunar infarcts in the basal ganglia. Electronically Signed   By: Jasmine PangKim  Fujinaga M.D.   On: 11/13/2020 17:44   DG Chest Port 1 View  Result Date: 11/14/2020 CLINICAL DATA:  Cough. EXAM: PORTABLE CHEST 1 VIEW COMPARISON:  Chest x-ray 11/08/2020, CT chest 11/08/2020 FINDINGS: Enlarged cardiac silhouette. The heart size and mediastinal contours are unchanged. Aortic calcifications. No focal consolidation. No pulmonary edema. No pleural effusion. No  pneumothorax. No acute osseous abnormality. IMPRESSION: No active disease. Electronically Signed   By: Tish FredericksonMorgane  Naveau M.D.   On: 11/14/2020 22:18        Scheduled Meds:  amoxicillin-clavulanate  1 tablet Oral Q12H   enoxaparin (LOVENOX) injection  0.5 mg/kg Subcutaneous Q24H   Gerhardt's butt cream   Topical BID   guaiFENesin  600 mg Oral BID   thiamine  100 mg Oral Daily   Continuous Infusions:  potassium chloride 10 mEq (11/15/20 1005)     LOS: 7 days    Time spent: 27 minutes    Marrion Coyekui Maila Dukes, MD [facility]   To contact the attending provider between 7A-7P or the covering provider during after hours 7P-7A, please log into the web site [email or web address] and access using universal [place] password for that web site. If you do not have the password, please call the hospital operator.  11/15/2020, 10:31 AM

## 2020-11-16 DIAGNOSIS — F4329 Adjustment disorder with other symptoms: Secondary | ICD-10-CM

## 2020-11-16 LAB — BASIC METABOLIC PANEL
Anion gap: 7 (ref 5–15)
BUN: 12 mg/dL (ref 8–23)
CO2: 28 mmol/L (ref 22–32)
Calcium: 8.2 mg/dL — ABNORMAL LOW (ref 8.9–10.3)
Chloride: 103 mmol/L (ref 98–111)
Creatinine, Ser: 0.72 mg/dL (ref 0.61–1.24)
GFR, Estimated: >60 mL/min (ref >60)
Glucose, Bld: 97 mg/dL (ref 70–99)
Potassium: 3.4 mmol/L — ABNORMAL LOW (ref 3.5–5.1)
Sodium: 138 mmol/L (ref 135–145)

## 2020-11-16 LAB — PHOSPHORUS: Phosphorus: 2.6 mg/dL (ref 2.5–4.6)

## 2020-11-16 LAB — MAGNESIUM: Magnesium: 1.8 mg/dL (ref 1.7–2.4)

## 2020-11-16 MED ORDER — LACTULOSE 10 GM/15ML PO SOLN
20.0000 g | Freq: Once | ORAL | Status: AC
  Administered 2020-11-16: 20 g via ORAL
  Filled 2020-11-16: qty 30

## 2020-11-16 MED ORDER — POTASSIUM CHLORIDE 10 MEQ/100ML IV SOLN
10.0000 meq | INTRAVENOUS | Status: AC
  Administered 2020-11-16 (×2): 10 meq via INTRAVENOUS
  Filled 2020-11-16 (×2): qty 100

## 2020-11-16 NOTE — Progress Notes (Signed)
   11/16/20 0910  Clinical Encounter Type  Visited With Patient  Visit Type Initial;Spiritual support;Social support  Referral From Other (Comment) (rounding)  Spiritual Encounters  Spiritual Needs Emotional;Other (Comment) (social)  Luna Fuse visited with Mr. Agena. Chaplain provided compassionate presence and reflective listening. Mr. Marney engaged in some life review, especially spoke of places he has travelled and mentioned desire to see granddaughter's marriage someday. Mr. Digioia has a supportive relationship at residence with his sister and looks forward to returning to their home. Chaplain Burris offered prayer and encouragement.

## 2020-11-16 NOTE — Progress Notes (Signed)
PROGRESS NOTE    Brad HerbKenneth J Reed  ZOX:[id number] DOB: 09/12/1934 DOA: 11/08/2020 PCP: Karie SchwalbeLetvak, Richard I, MD   Chief complaint.  Nausea vomiting. Brief Narrative:  Brad HerbKenneth J Reed is a 86 y.o. male with medical history significant for essential hypertension, diverticulosis, who is admitted to [facility] on 11/08/2020 with severe sepsis in the setting of acute transaminitis after presenting from home to [facility] ED complaining of nausea/vomiting.  Patient states that that he had a liver abscess a year ago which has since resolved. Upon arrival to emergency room, patient had a significant jaundice with a bilirubin of 8.4, MRCP showed stone in the common bile duct.  Patient has been evaluated by GI, successful ERCP on 6/28. Cholecystectomy was performed on 6/29, showed a significant inflammation and damage to the stomach and duodenum due to adhesion from inflamed gallbladder   Assessment & Plan:   Principal Problem:   Adjustment disorder with mixed emotional features Active Problems:   Essential hypertension, benign   Severe sepsis (HCC)   Transaminitis   Nausea & vomiting   Generalized weakness   Lactic acidosis   Choledocholithiasis   Acute cholecystitis  #1.  Severe sepsis. Choledocholithiasis with cholecystitis. Patient condition had improved after ERCP, cholecystectomy and a duodenal repair. He has passed gas multiple times, he had a small bowel movement.  But feel constipated.  We will give a dose of lactulose.  #2. Generalized weakness. Patient was able to stand with the nurse and transferred to bedside commode with help. Continue PT/OT.  Patient will be transferred to nursing home for rehab when bed available.  3.  Chronic alcohol abuse. Hypokalemia Hypophosphatemia. Will give IV potassium again for hypokalemia.  DVT prophylaxis: Lovenox Code Status: DNR Family Communication:  Disposition Plan:    Status is: Inpatient  Remains inpatient appropriate  because:Inpatient level of care appropriate due to severity of illness  Dispo: The patient is from: Home              Anticipated d/c is to: SNF              Patient currently is medically stable to d/c.   Difficult to place patient No        I/O last 3 completed shifts: In: 720 [P.O.:720] Out: 230 [Urine:200; Drains:30] No intake/output data recorded.     Consultants:  General surgery  Procedures: ERCP, cholecystectomy.  Antimicrobials: Augmentin.  Subjective: Patient is doing better today, still has significant weakness, was able to stand up with the nurse and moved to bedside commode with assist. Denies any short of breath or cough. No fever or chills. No abdominal pain or nausea vomiting.  He has good appetite. No dysuria hematuria.  Objective: Vitals:   11/15/20 2022 11/16/20 0338 11/16/20 0500 11/16/20 0844  BP: (!) 156/77 (!) 154/79  (!) 160/85  Pulse: 82 71  82  Resp: 18   18  Temp: 98.2 F (36.8 C) 98.1 F (36.7 C)  98.5 F (36.9 C)  TempSrc:  Oral    SpO2: 96% 93%  93%  Weight:   126 kg   Height:        Intake/Output Summary (Last 24 hours) at 11/16/2020 1019 Last data filed at 11/15/2020 1330 Gross per 24 hour  Intake 240 ml  Output --  Net 240 ml   Filed Weights   11/14/20 0503 11/15/20 0538 11/16/20 0500  Weight: 126 kg 123.9 kg 126 kg    Examination:  General exam: Appears calm  and comfortable  Respiratory system: Clear to auscultation. Respiratory effort normal. Cardiovascular system: S1 & S2 heard, RRR. No JVD, murmurs, rubs, gallops or clicks.  Gastrointestinal system: Abdomen is nondistended, soft and nontender. No organomegaly or masses felt. Normal bowel sounds heard. Central nervous system: Alert and oriented x3. No focal neurological deficits. Extremities: 1+ chronic leg edema Skin: No rashes, lesions or ulcers Psychiatry: Judgement and insight appear normal. Mood & affect appropriate.     Data Reviewed: I have personally  reviewed following labs and imaging studies  CBC: Recent Labs  Lab 11/10/20 0650 11/11/20 0705 11/12/20 0643 11/13/20 0602 11/14/20 0631  WBC 10.5 7.7 12.1* 10.1 10.0  NEUTROABS 8.5* 6.5 10.4* 7.6 6.8  HGB 14.1 14.3 13.7 13.5 12.9*  HCT 40.5 40.9 40.1 39.1 37.4*  MCV 95.3 95.1 97.1 97.5 97.7  PLT 146* 174 208 160 174   Basic Metabolic Panel: Recent Labs  Lab 11/12/20 0643 11/13/20 0602 11/14/20 0631 11/15/20 0635 11/16/20 0557  NA 138 138 140 138 138  K 3.7 3.2* 3.2* 3.3* 3.4*  CL 104 105 105 102 103  CO2 28 26 28 31 28   GLUCOSE 133* 96 104* 106* 97  BUN 17 17 12 11 12   CREATININE 0.91 0.95 0.81 0.93 0.72  CALCIUM 8.4* 8.0* 8.2* 8.4* 8.2*  MG 2.1 1.8 1.8 1.9 1.8  PHOS  --   --  1.9* 2.7 2.6   GFR: Estimated Creatinine Clearance: 91.6 mL/min (by C-G formula based on SCr of 0.72 mg/dL). Liver Function Tests: Recent Labs  Lab 11/10/20 0650 11/11/20 0705 11/12/20 0643  AST 136* 62* 56*  ALT 227* 154* 125*  ALKPHOS 108 101 93  BILITOT 9.4* 5.5* 3.3*  PROT 5.6* 5.7* 5.4*  ALBUMIN 2.8* 2.6* 2.5*   No results for input(s): LIPASE, AMYLASE in the last 168 hours. No results for input(s): AMMONIA in the last 168 hours. Coagulation Profile: No results for input(s): INR, PROTIME in the last 168 hours. Cardiac Enzymes: No results for input(s): CKTOTAL, CKMB, CKMBINDEX, TROPONINI in the last 168 hours. BNP (last 3 results) No results for input(s): PROBNP in the last 8760 hours. HbA1C: No results for input(s): HGBA1C in the last 72 hours. CBG: No results for input(s): GLUCAP in the last 168 hours. Lipid Profile: No results for input(s): CHOL, HDL, LDLCALC, TRIG, CHOLHDL, LDLDIRECT in the last 72 hours. Thyroid Function Tests: No results for input(s): TSH, T4TOTAL, FREET4, T3FREE, THYROIDAB in the last 72 hours. Anemia Panel: No results for input(s): VITAMINB12, FOLATE, FERRITIN, TIBC, IRON, RETICCTPCT in the last 72 hours. Sepsis Labs: No results for input(s):  PROCALCITON, LATICACIDVEN in the last 168 hours.  Recent Results (from the past 240 hour(s))  Resp Panel by RT-PCR (Flu A&B, Covid) Nasopharyngeal Swab     Status: None   Collection Time: 11/08/20  6:17 PM   Specimen: Nasopharyngeal Swab; Nasopharyngeal(NP) swabs in vial transport medium  Result Value Ref Range Status   SARS Coronavirus 2 by RT PCR NEGATIVE NEGATIVE Final    Comment: (NOTE) SARS-CoV-2 target nucleic acids are NOT DETECTED.  The SARS-CoV-2 RNA is generally detectable in upper respiratory specimens during the acute phase of infection. The lowest concentration of SARS-CoV-2 viral copies this assay can detect is 138 copies/mL. A negative result does not preclude SARS-Cov-2 infection and should not be used as the sole basis for treatment or other patient management decisions. A negative result may occur with  improper specimen collection/handling, submission of specimen other than nasopharyngeal swab, presence of  viral mutation(s) within the areas targeted by this assay, and inadequate number of viral copies(<138 copies/mL). A negative result must be combined with clinical observations, patient history, and epidemiological information. The expected result is Negative.  Fact Sheet for Patients:  BloggerCourse.com  Fact Sheet for Healthcare Providers:  SeriousBroker.it  This test is no t yet approved or cleared by the [place] FDA and  has been authorized for detection and/or diagnosis of SARS-CoV-2 by FDA under an Emergency Use Authorization (EUA). This EUA will remain  in effect (meaning this test can be used) for the duration of the COVID-19 declaration under Section 564(b)(1) of the Act, 21 U.S.C.section 360bbb-3(b)(1), unless the authorization is terminated  or revoked sooner.       Influenza A by PCR NEGATIVE NEGATIVE Final   Influenza B by PCR NEGATIVE NEGATIVE Final    Comment: (NOTE) The Xpert Xpress  SARS-CoV-2/FLU/RSV plus assay is intended as an aid in the diagnosis of influenza from Nasopharyngeal swab specimens and should not be used as a sole basis for treatment. Nasal washings and aspirates are unacceptable for Xpert Xpress SARS-CoV-2/FLU/RSV testing.  Fact Sheet for Patients: BloggerCourse.com  Fact Sheet for Healthcare Providers: SeriousBroker.it  This test is not yet approved or cleared by the [place] FDA and has been authorized for detection and/or diagnosis of SARS-CoV-2 by FDA under an Emergency Use Authorization (EUA). This EUA will remain in effect (meaning this test can be used) for the duration of the COVID-19 declaration under Section 564(b)(1) of the Act, 21 U.S.C. section 360bbb-3(b)(1), unless the authorization is terminated or revoked.  Performed at [facility], [address] [place], [place] [address]   Blood culture (routine single)     Status: Abnormal   Collection Time: 11/08/20  6:48 PM   Specimen: BLOOD  Result Value Ref Range Status   Specimen Description   Final    BLOOD  RT [facility] Performed at [facility], [address] [place], [place] [address]    Special Requests   Final    BOTTLES DRAWN AEROBIC AND ANAEROBIC Blood Culture adequate volume Performed at [facility], [address] [place], [place] [address]    Culture  Setup Time   Final    IN BOTH AEROBIC AND ANAEROBIC BOTTLES GRAM POSITIVE COCCI CRITICAL RESULT CALLED TO, READ BACK BY AND VERIFIED WITH: MORGAN HICKS 11/09/20 1255 KLW    Culture (A)  Final    STAPHYLOCOCCUS EPIDERMIDIS THE SIGNIFICANCE OF ISOLATING THIS ORGANISM FROM A SINGLE SET OF BLOOD CULTURES WHEN MULTIPLE SETS ARE DRAWN IS UNCERTAIN. PLEASE NOTIFY THE MICROBIOLOGY DEPARTMENT WITHIN ONE WEEK IF SPECIATION AND SENSITIVITIES ARE REQUIRED. Performed at [facility], [address] [place], [place] [address]    Report Status  11/11/2020 FINAL  Final  Blood Culture ID Panel (Reflexed)     Status: Abnormal   Collection Time: 11/08/20  6:48 PM  Result Value Ref Range Status   Enterococcus faecalis NOT DETECTED NOT DETECTED Final   Enterococcus Faecium NOT DETECTED NOT DETECTED Final   Listeria monocytogenes NOT DETECTED NOT DETECTED Final   Staphylococcus species DETECTED (A) NOT DETECTED Final    Comment: CRITICAL RESULT CALLED TO, READ BACK BY AND VERIFIED WITH: MORGAN HICKS 11/09/20 1255 KLW    Staphylococcus aureus (BCID) NOT DETECTED NOT DETECTED Final   Staphylococcus epidermidis DETECTED (A) NOT DETECTED Final    Comment: Methicillin (oxacillin) resistant coagulase negative staphylococcus. Possible blood culture contaminant (unless isolated from more than  one blood culture draw or clinical case suggests pathogenicity). No antibiotic treatment is indicated for blood  culture contaminants. CRITICAL RESULT CALLED TO, READ BACK BY AND VERIFIED WITH: MORGAN HICKS 11/09/20 1255 KLW    Staphylococcus lugdunensis NOT DETECTED NOT DETECTED Final   Streptococcus species NOT DETECTED NOT DETECTED Final   Streptococcus agalactiae NOT DETECTED NOT DETECTED Final   Streptococcus pneumoniae NOT DETECTED NOT DETECTED Final   Streptococcus pyogenes NOT DETECTED NOT DETECTED Final   A.calcoaceticus-baumannii NOT DETECTED NOT DETECTED Final   Bacteroides fragilis NOT DETECTED NOT DETECTED Final   Enterobacterales NOT DETECTED NOT DETECTED Final   Enterobacter cloacae complex NOT DETECTED NOT DETECTED Final   Escherichia coli NOT DETECTED NOT DETECTED Final   Klebsiella aerogenes NOT DETECTED NOT DETECTED Final   Klebsiella oxytoca NOT DETECTED NOT DETECTED Final   Klebsiella pneumoniae NOT DETECTED NOT DETECTED Final   Proteus species NOT DETECTED NOT DETECTED Final   Salmonella species NOT DETECTED NOT DETECTED Final   Serratia marcescens NOT DETECTED NOT DETECTED Final   Haemophilus influenzae NOT DETECTED NOT DETECTED  Final   Neisseria meningitidis NOT DETECTED NOT DETECTED Final   Pseudomonas aeruginosa NOT DETECTED NOT DETECTED Final   Stenotrophomonas maltophilia NOT DETECTED NOT DETECTED Final   Candida albicans NOT DETECTED NOT DETECTED Final   Candida auris NOT DETECTED NOT DETECTED Final   Candida glabrata NOT DETECTED NOT DETECTED Final   Candida krusei NOT DETECTED NOT DETECTED Final   Candida parapsilosis NOT DETECTED NOT DETECTED Final   Candida tropicalis NOT DETECTED NOT DETECTED Final   Cryptococcus neoformans/gattii NOT DETECTED NOT DETECTED Final   Methicillin resistance mecA/C DETECTED (A) NOT DETECTED Final    Comment: CRITICAL RESULT CALLED TO, READ BACK BY AND VERIFIED WITH: MORGAN HICKS 11/09/20 1255 KLW Performed at [facility], [address] [place], [place] [address]   Urine culture     Status: Abnormal   Collection Time: 11/08/20  9:53 PM   Specimen: In/Out Cath Urine  Result Value Ref Range Status   Specimen Description   Final    IN/OUT CATH URINE Performed at [facility], [address] [place], [place] [address]    Special Requests   Final    NONE Performed at [facility], [address] [place], [place] [address]    Culture MULTIPLE SPECIES PRESENT, SUGGEST RECOLLECTION (A)  Final   Report Status 11/10/2020 FINAL  Final  CULTURE, BLOOD (ROUTINE X 2) w Reflex to ID Panel     Status: None   Collection Time: 11/09/20  2:22 PM   Specimen: BLOOD RIGHT HAND  Result Value Ref Range Status   Specimen Description BLOOD RIGHT HAND  Final   Special Requests   Final    BOTTLES DRAWN AEROBIC AND ANAEROBIC Blood Culture results may not be optimal due to an inadequate volume of blood received in culture bottles   Culture   Final    NO GROWTH 5 DAYS Performed at [facility], [address] [place], [place] [address]    Report Status 11/14/2020 FINAL  Final  CULTURE, BLOOD (ROUTINE X 2) w Reflex to ID Panel     Status: None    Collection Time: 11/09/20  2:22 PM   Specimen: BLOOD LEFT HAND  Result Value Ref Range Status   Specimen Description BLOOD LEFT HAND  Final   Special Requests   Final    BOTTLES DRAWN AEROBIC AND ANAEROBIC Blood Culture results may not be optimal  due to an inadequate volume of blood received in culture bottles   Culture   Final    NO GROWTH 5 DAYS Performed at [facility], [address] [place], [place] [address]    Report Status 11/14/2020 FINAL  Final         Radiology Studies: [facility] Chest Port 1 View  Result Date: 11/14/2020 CLINICAL DATA:  Cough. EXAM: PORTABLE CHEST 1 VIEW COMPARISON:  Chest x-ray 11/08/2020, CT chest 11/08/2020 FINDINGS: Enlarged cardiac silhouette. The heart size and mediastinal contours are unchanged. Aortic calcifications. No focal consolidation. No pulmonary edema. No pleural effusion. No pneumothorax. No acute osseous abnormality. IMPRESSION: No active disease. Electronically Signed   By: Tish Frederickson M.D.   On: 11/14/2020 22:18        Scheduled Meds:  amoxicillin-clavulanate  1 tablet Oral Q12H   enoxaparin (LOVENOX) injection  0.5 mg/kg Subcutaneous Q24H   Gerhardt's butt cream   Topical BID   guaiFENesin  600 mg Oral BID   thiamine  100 mg Oral Daily   Continuous Infusions:  potassium chloride 10 mEq (11/16/20 0846)     LOS: 8 days    Time spent: 28 minutes    Marrion Coy, MD [facility]   To contact the attending provider between 7A-7P or the covering provider during after hours 7P-7A, please log into the web site [email or web address] and access using universal [place] password for that web site. If you do not have the password, please call the hospital operator.  11/16/2020, 10:19 AM

## 2020-11-16 NOTE — Care Management Important Message (Signed)
Important Message  Patient Details  Name: Brad Reed MRN: [id number] Date of Birth: 28-Aug-1934   Medicare Important Message Given:  Yes     [name] 11/16/2020, 1:20 PM

## 2020-11-16 NOTE — Progress Notes (Signed)
Pt is A&O, VS stable, NSR on the monitor, IV in place. JP drain in place. Was up in chair at shift change, assisted back to bed with 2 assist. One complaint of pain, given norco with relief. No other complaints, primofit failed, cleaned up and removed. Pt [facility]

## 2020-11-17 LAB — MAGNESIUM: Magnesium: 2 mg/dL (ref 1.7–2.4)

## 2020-11-17 LAB — BASIC METABOLIC PANEL
Anion gap: 4 — ABNORMAL LOW (ref 5–15)
BUN: 11 mg/dL (ref 8–23)
CO2: 31 mmol/L (ref 22–32)
Calcium: 8.4 mg/dL — ABNORMAL LOW (ref 8.9–10.3)
Chloride: 104 mmol/L (ref 98–111)
Creatinine, Ser: 0.65 mg/dL (ref 0.61–1.24)
GFR, Estimated: >60 mL/min (ref >60)
Glucose, Bld: 99 mg/dL (ref 70–99)
Potassium: 3.4 mmol/L — ABNORMAL LOW (ref 3.5–5.1)
Sodium: 139 mmol/L (ref 135–145)

## 2020-11-17 LAB — PHOSPHORUS: Phosphorus: 2.6 mg/dL (ref 2.5–4.6)

## 2020-11-17 LAB — SURGICAL PATHOLOGY

## 2020-11-17 MED ORDER — POTASSIUM CHLORIDE 10 MEQ/100ML IV SOLN
10.0000 meq | INTRAVENOUS | Status: AC
  Administered 2020-11-17 (×4): 10 meq via INTRAVENOUS
  Filled 2020-11-17 (×4): qty 100

## 2020-11-17 NOTE — Progress Notes (Signed)
PROGRESS NOTE    Brad Reed  ZOX:[id number] DOB: 1934/10/03 DOA: 11/08/2020 PCP: Karie Schwalbe, MD   Chief complaint.  Nausea vomiting. Brief Narrative:  Brad Reed is a 86 y.o. male with medical history significant for essential hypertension, diverticulosis, who is admitted to [facility] on 11/08/2020 with severe sepsis in the setting of acute transaminitis after presenting from home to [facility] ED complaining of nausea/vomiting.  Patient states that that he had a liver abscess a year ago which has since resolved. Upon arrival to emergency room, patient had a significant jaundice with a bilirubin of 8.4, MRCP showed stone in the common bile duct.  Patient has been evaluated by GI, successful ERCP on 6/28. Cholecystectomy was performed on 6/29, showed a significant inflammation and damage to the stomach and duodenum due to adhesion from inflamed gallbladder.  He is treated with Zosyn, then changed to Augmentin.  Final dose 11/19/2020.   Assessment & Plan:   Principal Problem:   Adjustment disorder with mixed emotional features Active Problems:   Essential hypertension, benign   Severe sepsis (HCC)   Transaminitis   Nausea & vomiting   Generalized weakness   Lactic acidosis   Choledocholithiasis   Acute cholecystitis  #1.  Severe sepsis. Choledocholithiasis with cholecystitis. Patient condition had improved after ERCP, cholecystectomy and duodenal repair Patient condition has resolved, still on oral antibiotics, last treatment 7/7.  #2.  Generalized weakness. Continue PT/OT.  Patient is pending for nursing home placement.  3.  Chronic alcohol abuse. Hypokalemia Hypophosphatemia. Patient receiving large amount of potassium infusion.  Recheck a BMP tomorrow.   DVT prophylaxis: Lovenox Code Status: DNR Family Communication:  Disposition Plan:    Status is: Inpatient  Remains inpatient appropriate because:Unsafe d/c plan  Dispo: The patient is  from: Home              Anticipated d/c is to: SNF              Patient currently is medically stable to d/c.   Difficult to place patient No        I/O last 3 completed shifts: In: 559.6 [P.O.:360; IV Piggyback:199.6] Out: 50 [Drains:50] Total I/O In: 220 [P.O.:220] Out: -      Consultants:  General surgery  Procedures: ERCP, cholecystectomy with duodenal repair.  Antimicrobials: Augmentin.  Subjective: Patient doing well today, still has significant weakness.  Took 2 people to stand him up and transfer to chair. Denies short of breath or cough. No abdominal pain or nausea vomiting.  2 large bowel movement after giving lactulose. No dysuria hematuria. No fever or chills.   Objective: Vitals:   11/17/20 0344 11/17/20 0500 11/17/20 0721 11/17/20 1134  BP: (!) 165/87  (!) 167/82 (!) 156/90  Pulse: 82  71 81  Resp:   18   Temp: 98.2 F (36.8 C)  98.4 F (36.9 C) 98.2 F (36.8 C)  TempSrc: Oral  Oral Oral  SpO2: 96%  90% 96%  Weight:  123.9 kg    Height:        Intake/Output Summary (Last 24 hours) at 11/17/2020 1157 Last data filed at 11/17/2020 0844 Gross per 24 hour  Intake 659.55 ml  Output 50 ml  Net 609.55 ml   Filed Weights   11/15/20 0538 11/16/20 0500 11/17/20 0500  Weight: 123.9 kg 126 kg 123.9 kg    Examination:  General exam: Appears calm and comfortable  Respiratory system: Clear to auscultation. Respiratory effort normal. Cardiovascular  system: S1 & S2 heard, RRR. No JVD, murmurs, rubs, gallops or clicks. No pedal edema. Gastrointestinal system: Abdomen is nondistended, soft and nontender. No organomegaly or masses felt. Normal bowel sounds heard. Central nervous system: Alert and oriented. x3 No focal neurological deficits. Extremities: Symmetric 5 x 5 power. Skin: No rashes, lesions or ulcers Psychiatry: Mood & affect appropriate.     Data Reviewed: I have personally reviewed following labs and imaging studies  CBC: Recent Labs   Lab 11/11/20 0705 11/12/20 0643 11/13/20 0602 11/14/20 0631  WBC 7.7 12.1* 10.1 10.0  NEUTROABS 6.5 10.4* 7.6 6.8  HGB 14.3 13.7 13.5 12.9*  HCT 40.9 40.1 39.1 37.4*  MCV 95.1 97.1 97.5 97.7  PLT 174 208 160 174   Basic Metabolic Panel: Recent Labs  Lab 11/13/20 0602 11/14/20 0631 11/15/20 0635 11/16/20 0557 11/17/20 0708  NA 138 140 138 138 139  K 3.2* 3.2* 3.3* 3.4* 3.4*  CL 105 105 102 103 104  CO2 26 28 31 28 31   GLUCOSE 96 104* 106* 97 99  BUN 17 12 11 12 11   CREATININE 0.95 0.81 0.93 0.72 0.65  CALCIUM 8.0* 8.2* 8.4* 8.2* 8.4*  MG 1.8 1.8 1.9 1.8 2.0  PHOS  --  1.9* 2.7 2.6 2.6   GFR: Estimated Creatinine Clearance: 90.8 mL/min (by C-G formula based on SCr of 0.65 mg/dL). Liver Function Tests: Recent Labs  Lab 11/11/20 0705 11/12/20 0643  AST 62* 56*  ALT 154* 125*  ALKPHOS 101 93  BILITOT 5.5* 3.3*  PROT 5.7* 5.4*  ALBUMIN 2.6* 2.5*   No results for input(s): LIPASE, AMYLASE in the last 168 hours. No results for input(s): AMMONIA in the last 168 hours. Coagulation Profile: No results for input(s): INR, PROTIME in the last 168 hours. Cardiac Enzymes: No results for input(s): CKTOTAL, CKMB, CKMBINDEX, TROPONINI in the last 168 hours. BNP (last 3 results) No results for input(s): PROBNP in the last 8760 hours. HbA1C: No results for input(s): HGBA1C in the last 72 hours. CBG: No results for input(s): GLUCAP in the last 168 hours. Lipid Profile: No results for input(s): CHOL, HDL, LDLCALC, TRIG, CHOLHDL, LDLDIRECT in the last 72 hours. Thyroid Function Tests: No results for input(s): TSH, T4TOTAL, FREET4, T3FREE, THYROIDAB in the last 72 hours. Anemia Panel: No results for input(s): VITAMINB12, FOLATE, FERRITIN, TIBC, IRON, RETICCTPCT in the last 72 hours. Sepsis Labs: No results for input(s): PROCALCITON, LATICACIDVEN in the last 168 hours.  Recent Results (from the past 240 hour(s))  Resp Panel by RT-PCR (Flu A&B, Covid) Nasopharyngeal Swab      Status: None   Collection Time: 11/08/20  6:17 PM   Specimen: Nasopharyngeal Swab; Nasopharyngeal(NP) swabs in vial transport medium  Result Value Ref Range Status   SARS Coronavirus 2 by RT PCR NEGATIVE NEGATIVE Final    Comment: (NOTE) SARS-CoV-2 target nucleic acids are NOT DETECTED.  The SARS-CoV-2 RNA is generally detectable in upper respiratory specimens during the acute phase of infection. The lowest concentration of SARS-CoV-2 viral copies this assay can detect is 138 copies/mL. A negative result does not preclude SARS-Cov-2 infection and should not be used as the sole basis for treatment or other patient management decisions. A negative result may occur with  improper specimen collection/handling, submission of specimen other than nasopharyngeal swab, presence of viral mutation(s) within the areas targeted by this assay, and inadequate number of viral copies(<138 copies/mL). A negative result must be combined with clinical observations, patient history, and epidemiological information. The expected  result is Negative.  Fact Sheet for Patients:  BloggerCourse.com  Fact Sheet for Healthcare Providers:  SeriousBroker.it  This test is no t yet approved or cleared by the [place] FDA and  has been authorized for detection and/or diagnosis of SARS-CoV-2 by FDA under an Emergency Use Authorization (EUA). This EUA will remain  in effect (meaning this test can be used) for the duration of the COVID-19 declaration under Section 564(b)(1) of the Act, 21 U.S.C.section 360bbb-3(b)(1), unless the authorization is terminated  or revoked sooner.       Influenza A by PCR NEGATIVE NEGATIVE Final   Influenza B by PCR NEGATIVE NEGATIVE Final    Comment: (NOTE) The Xpert Xpress SARS-CoV-2/FLU/RSV plus assay is intended as an aid in the diagnosis of influenza from Nasopharyngeal swab specimens and should not be used as a sole basis  for treatment. Nasal washings and aspirates are unacceptable for Xpert Xpress SARS-CoV-2/FLU/RSV testing.  Fact Sheet for Patients: BloggerCourse.com  Fact Sheet for Healthcare Providers: SeriousBroker.it  This test is not yet approved or cleared by the [place] FDA and has been authorized for detection and/or diagnosis of SARS-CoV-2 by FDA under an Emergency Use Authorization (EUA). This EUA will remain in effect (meaning this test can be used) for the duration of the COVID-19 declaration under Section 564(b)(1) of the Act, 21 U.S.C. section 360bbb-3(b)(1), unless the authorization is terminated or revoked.  Performed at [facility], [address] [place], [place] [address]   Blood culture (routine single)     Status: Abnormal   Collection Time: 11/08/20  6:48 PM   Specimen: BLOOD  Result Value Ref Range Status   Specimen Description   Final    BLOOD  RT [facility] Performed at [facility], [address] [place], [place] [address]    Special Requests   Final    BOTTLES DRAWN AEROBIC AND ANAEROBIC Blood Culture adequate volume Performed at [facility], [address] [place], [place] [address]    Culture  Setup Time   Final    IN BOTH AEROBIC AND ANAEROBIC BOTTLES GRAM POSITIVE COCCI CRITICAL RESULT CALLED TO, READ BACK BY AND VERIFIED WITH: MORGAN HICKS 11/09/20 1255 KLW    Culture (A)  Final    STAPHYLOCOCCUS EPIDERMIDIS THE SIGNIFICANCE OF ISOLATING THIS ORGANISM FROM A SINGLE SET OF BLOOD CULTURES WHEN MULTIPLE SETS ARE DRAWN IS UNCERTAIN. PLEASE NOTIFY THE MICROBIOLOGY DEPARTMENT WITHIN ONE WEEK IF SPECIATION AND SENSITIVITIES ARE REQUIRED. Performed at [facility], [address], [place], [place] [address]    Report Status 11/11/2020 FINAL  Final  Blood Culture ID Panel (Reflexed)     Status: Abnormal   Collection Time: 11/08/20  6:48 PM  Result Value Ref Range Status    Enterococcus faecalis NOT DETECTED NOT DETECTED Final   Enterococcus Faecium NOT DETECTED NOT DETECTED Final   Listeria monocytogenes NOT DETECTED NOT DETECTED Final   Staphylococcus species DETECTED (A) NOT DETECTED Final    Comment: CRITICAL RESULT CALLED TO, READ BACK BY AND VERIFIED WITH: MORGAN HICKS 11/09/20 1255 KLW    Staphylococcus aureus (BCID) NOT DETECTED NOT DETECTED Final   Staphylococcus epidermidis DETECTED (A) NOT DETECTED Final    Comment: Methicillin (oxacillin) resistant coagulase negative staphylococcus. Possible blood culture contaminant (unless isolated from more than one blood culture draw or clinical case suggests pathogenicity). No antibiotic treatment is indicated for blood  culture contaminants. CRITICAL RESULT CALLED TO, READ BACK BY AND VERIFIED WITH: MORGAN HICKS 11/09/20  1255 KLW    Staphylococcus lugdunensis NOT DETECTED NOT DETECTED Final   Streptococcus species NOT DETECTED NOT DETECTED Final   Streptococcus agalactiae NOT DETECTED NOT DETECTED Final   Streptococcus pneumoniae NOT DETECTED NOT DETECTED Final   Streptococcus pyogenes NOT DETECTED NOT DETECTED Final   A.calcoaceticus-baumannii NOT DETECTED NOT DETECTED Final   Bacteroides fragilis NOT DETECTED NOT DETECTED Final   Enterobacterales NOT DETECTED NOT DETECTED Final   Enterobacter cloacae complex NOT DETECTED NOT DETECTED Final   Escherichia coli NOT DETECTED NOT DETECTED Final   Klebsiella aerogenes NOT DETECTED NOT DETECTED Final   Klebsiella oxytoca NOT DETECTED NOT DETECTED Final   Klebsiella pneumoniae NOT DETECTED NOT DETECTED Final   Proteus species NOT DETECTED NOT DETECTED Final   Salmonella species NOT DETECTED NOT DETECTED Final   Serratia marcescens NOT DETECTED NOT DETECTED Final   Haemophilus influenzae NOT DETECTED NOT DETECTED Final   Neisseria meningitidis NOT DETECTED NOT DETECTED Final   Pseudomonas aeruginosa NOT DETECTED NOT DETECTED Final   Stenotrophomonas maltophilia  NOT DETECTED NOT DETECTED Final   Candida albicans NOT DETECTED NOT DETECTED Final   Candida auris NOT DETECTED NOT DETECTED Final   Candida glabrata NOT DETECTED NOT DETECTED Final   Candida krusei NOT DETECTED NOT DETECTED Final   Candida parapsilosis NOT DETECTED NOT DETECTED Final   Candida tropicalis NOT DETECTED NOT DETECTED Final   Cryptococcus neoformans/gattii NOT DETECTED NOT DETECTED Final   Methicillin resistance mecA/C DETECTED (A) NOT DETECTED Final    Comment: CRITICAL RESULT CALLED TO, READ BACK BY AND VERIFIED WITH: MORGAN HICKS 11/09/20 1255 KLW Performed at [facility], [address] [place], [place] [address]   Urine culture     Status: Abnormal   Collection Time: 11/08/20  9:53 PM   Specimen: In/Out Cath Urine  Result Value Ref Range Status   Specimen Description   Final    IN/OUT CATH URINE Performed at [facility], [address] [place], [place] [address]    Special Requests   Final    NONE Performed at [facility], [address] [place], [place] [address]    Culture MULTIPLE SPECIES PRESENT, SUGGEST RECOLLECTION (A)  Final   Report Status 11/10/2020 FINAL  Final  CULTURE, BLOOD (ROUTINE X 2) w Reflex to ID Panel     Status: None   Collection Time: 11/09/20  2:22 PM   Specimen: BLOOD RIGHT HAND  Result Value Ref Range Status   Specimen Description BLOOD RIGHT HAND  Final   Special Requests   Final    BOTTLES DRAWN AEROBIC AND ANAEROBIC Blood Culture results may not be optimal due to an inadequate volume of blood received in culture bottles   Culture   Final    NO GROWTH 5 DAYS Performed at [facility], [address] [place], [place] [address]    Report Status 11/14/2020 FINAL  Final  CULTURE, BLOOD (ROUTINE X 2) w Reflex to ID Panel     Status: None   Collection Time: 11/09/20  2:22 PM   Specimen: BLOOD LEFT HAND  Result Value Ref Range Status   Specimen Description BLOOD LEFT HAND  Final   Special  Requests   Final    BOTTLES DRAWN AEROBIC AND ANAEROBIC Blood Culture results may not be optimal due to an inadequate volume of blood received in culture bottles   Culture   Final    NO GROWTH 5 DAYS Performed at [facility], [address] [place], [place] [address]    Report Status 11/14/2020 FINAL  Final         Radiology Studies: No results found.      Scheduled Meds:  amoxicillin-clavulanate  1 tablet Oral Q12H   enoxaparin (LOVENOX) injection  0.5 mg/kg Subcutaneous Q24H   Gerhardt's butt cream   Topical BID   guaiFENesin  600 mg Oral BID   thiamine  100 mg Oral Daily   Continuous Infusions:  potassium chloride 10 mEq (11/17/20 1120)     LOS: 9 days    Time spent: 32 minutes    Marrion Coy, MD [facility]   To contact the attending provider between 7A-7P or the covering provider during after hours 7P-7A, please log into the web site [email or web address] and access using universal [place] password for that web site. If you do not have the password, please call the hospital operator.  11/17/2020, 11:57 AM

## 2020-11-17 NOTE — Progress Notes (Signed)
Pt is A&O, VS stable, NSR on the monitor. RA. IV in place. No complaints of pain or discomfort tonight. Incontinent of urine. Had loose BM tonight. Plan for D/C to rehab.

## 2020-11-17 NOTE — Progress Notes (Signed)
Physical Therapy Treatment Patient Details Name: Brad Reed MRN: [id number] DOB: August 23, 1934 Today's Date: 11/17/2020    History of Present Illness Brad Reed is an 86yoM admitted on 11/08/20 with c/c of N&V, treated for severe sepsis. Imaging revealed choledocholithiasis & cholelithiasis (underwent ERCP on 6/28). PMH: HTN, diverticulosis, knee arthritis, macular degeneration, obesity, peripheral neuropathy, sleep apnea.    PT Comments    Pt motivated to do as much as he can but more limited than he insists he is. He was very slow and labored to get to EOB on his own but despite much time and cuing he ultimately needed some assist to attain square at EOB.  Similarly he was determined to try to get to standing w/o assist but could not (despite multiple attempts and raising bed height) w/o significant assist to rise to standing.  Pt needed extensive BM clean up, able to maintain static standing relatively well with heavy UE/walker use. After rest break he was able (with assist) to rise again and go for a slow and labored walk into the hallway, needed to stop at ~50 ft, stooped, shuffing, labored gait.  Pt not moving nearly as well as he had during PT exam and despite great effort is now significantly different from his baseline; changing recommendations to STR and pt whole heartedly agrees with this plan.     Follow Up Recommendations  Supervision - Intermittent;SNF     Equipment Recommendations  None recommended by PT    Recommendations for Other Services       Precautions / Restrictions Precautions Precautions: Fall Restrictions Weight Bearing Restrictions: No    Mobility  Bed Mobility Overal bed mobility: Needs Assistance Bed Mobility: Supine to Sit     Supine to sit: Min assist     General bed mobility comments: Pt with very slow and labored effort in getting to EOB.  Complains of positioning of hand rails not as convenient as his at home, however this did not seem to truly  be a significant limiter.  He needed many minutes, heavy encouragement and and multiple direct phyiscal assist boosts to get fully square to EOB    Transfers Overall transfer level: Needs assistance Equipment used: Rolling walker (2 wheeled) Transfers: Sit to/from Stand Sit to Stand: Mod assist         General transfer comment: Pt repeatedly stating that he thought he could get up to standing at EOB w/o assist, however even with plenty of cuing, extra time and encouargement he was unable, even after raising bed a few inches.  Finally with considerable direct assist he was able to shift weight forward and up enough to attain standing.  Ambulation/Gait Ambulation/Gait assistance: Min assist (chair following) Gait Distance (Feet): 55 Feet Assistive device: Rolling walker (2 wheeled) Gait Pattern/deviations: Trunk flexed     General Gait Details: Pt with very slow and labored effort with ambulation.  Highly reliant on the walker and only able to improve posture transiently before returning to very hunched posture.  Pt did fatigue with the effort and overall remains considerably more limited than his baseline.   Stairs             Wheelchair Mobility    Modified Rankin (Stroke Patients Only)       Balance Overall balance assessment: Needs assistance Sitting-balance support: Bilateral upper extremity supported;Feet supported Sitting balance-Leahy Scale: Fair Sitting balance - Comments: struggled to get to EOB and therefore had multiple episodes of loosing balance backard/sideways and going [place]  in the bed until PT finally assist him to more stable upright position where he was more able to maintain balance w/o direct assist   Standing balance support: Bilateral upper extremity supported;During functional activity Standing balance-Leahy Scale: Fair Standing balance comment: BUE on RW, no LOB noted                            Cognition Arousal/Alertness:  Awake/alert Behavior During Therapy: WFL for tasks assessed/performed Overall Cognitive Status: Impaired/Different from baseline                                 General Comments: increased time with mod - max multimodal cuing for sequencing and initiation.      Exercises      General Comments General comments (skin integrity, edema, etc.): Upon assist to standing revealed large bowel movement in bed with additional bowel release in standing.  Pt was able to maintain prolonged static standing for assist with significant clean up.  Pt with no LOBs, mild fatigue with this situation but no overt safety/balance issues.      Pertinent Vitals/Pain Pain Assessment: No/denies pain    Home Living                      Prior Function            PT Goals (current goals can now be found in the care plan section) Progress towards PT goals: Progressing toward goals    Frequency    Min 2X/week      PT Plan Discharge plan needs to be updated    Co-evaluation              AM-PAC PT "6 Clicks" Mobility   Outcome Measure  Help needed turning from your back to your side while in a flat bed without using bedrails?: A Little Help needed moving from lying on your back to sitting on the side of a flat bed without using bedrails?: A Little Help needed moving to and from a bed to a chair (including a wheelchair)?: A Little Help needed standing up from a chair using your arms (e.g., wheelchair or bedside chair)?: A Lot Help needed to walk in hospital room?: A Little Help needed climbing 3-5 steps with a railing? : Total 6 Click Score: 15    End of Session Equipment Utilized During Treatment: Gait belt Activity Tolerance: Patient tolerated treatment well;No increased pain Patient left: with chair alarm set;with call bell/phone within reach Nurse Communication: Mobility status PT Visit Diagnosis: Muscle weakness (generalized) (M62.81);Unsteadiness on feet  (R26.81)     Time: 7062-3762 PT Time Calculation (min) (ACUTE ONLY): 58 min  Charges:  $Gait Training: 8-22 mins $Therapeutic Exercise: 38-52 mins                     Malachi Pro, DPT 11/17/2020, 11:32 AM

## 2020-11-17 NOTE — TOC Progression Note (Addendum)
Transition of Care [facility]) - Progression Note    Patient Details  Name: BREXTON SOFIA MRN: [id number] Date of Birth: May 01, 1935  [facility] of [facility] CM/SW Contact  Gildardo Griffes, [place] Phone Number: 11/17/2020, 8:57 AM  Clinical Narrative:     Update 2: Son called CSW Back, confirmed would like to move forward with [facility]. Informed him they have started insurance auth, pending auth at this time.   Update: CSW has attempted to call and text again for son POA to choose bed offers for SNF placement. No response. One of the bed offers son had previously mentioned was [facility], so CSW has asked [facility] to go ahead and start insurance auth as to not delay discharge.   Lvm with POA [place] to review bed offers, pending call back. Patient is not managed by [facility], so would need SNF choice for SNF then to get auth.   Pending call back at this time.   Expected Discharge Plan:  (TBD)    Expected Discharge Plan and Services Expected Discharge Plan:  (TBD)     Post Acute Care Choice:  (TBD) Living arrangements for the past 2 months: Independent Living Facility                                       Social Determinants of Health (SDOH) Interventions    Readmission Risk Interventions No flowsheet data found.

## 2020-11-18 LAB — BASIC METABOLIC PANEL
Anion gap: 4 — ABNORMAL LOW (ref 5–15)
BUN: 12 mg/dL (ref 8–23)
CO2: 30 mmol/L (ref 22–32)
Calcium: 8.3 mg/dL — ABNORMAL LOW (ref 8.9–10.3)
Chloride: 104 mmol/L (ref 98–111)
Creatinine, Ser: 0.7 mg/dL (ref 0.61–1.24)
GFR, Estimated: >60 mL/min (ref >60)
Glucose, Bld: 92 mg/dL (ref 70–99)
Potassium: 3.5 mmol/L (ref 3.5–5.1)
Sodium: 138 mmol/L (ref 135–145)

## 2020-11-18 LAB — CBC WITH DIFFERENTIAL/PLATELET
Abs Immature Granulocytes: 0.17 10*3/uL — ABNORMAL HIGH (ref 0.00–0.07)
Basophils Absolute: 0.1 10*3/uL (ref 0.0–0.1)
Basophils Relative: 1 %
Eosinophils Absolute: 0.5 10*3/uL (ref 0.0–0.5)
Eosinophils Relative: 6 %
HCT: 39.5 % (ref 39.0–52.0)
Hemoglobin: 13.5 g/dL (ref 13.0–17.0)
Immature Granulocytes: 2 %
Lymphocytes Relative: 17 %
Lymphs Abs: 1.5 10*3/uL (ref 0.7–4.0)
MCH: 32.8 pg (ref 26.0–34.0)
MCHC: 34.2 g/dL (ref 30.0–36.0)
MCV: 96.1 fL (ref 80.0–100.0)
Monocytes Absolute: 1 10*3/uL (ref 0.1–1.0)
Monocytes Relative: 11 %
Neutro Abs: 5.7 10*3/uL (ref 1.7–7.7)
Neutrophils Relative %: 63 %
Platelets: 280 10*3/uL (ref 150–400)
RBC: 4.11 MIL/uL — ABNORMAL LOW (ref 4.22–5.81)
RDW: 12.4 % (ref 11.5–15.5)
WBC: 9 10*3/uL (ref 4.0–10.5)
nRBC: 0 % (ref 0.0–0.2)

## 2020-11-18 NOTE — Progress Notes (Signed)
Occupational Therapy Treatment Patient Details Name: Brad Reed MRN: [id number] DOB: 1934/09/06 Today's Date: 11/18/2020    History of present illness Brad Reed is an 86yoM admitted on 11/08/20 with c/c of N&V, treated for severe sepsis. Imaging revealed choledocholithiasis & cholelithiasis (underwent ERCP on 6/28). PMH: HTN, diverticulosis, knee arthritis, macular degeneration, obesity, peripheral neuropathy, sleep apnea.   OT comments  Brad Reed was seen for OT treatment on this date. Upon arrival to room pt in chair with NT setting up for bath, pt agreeable to bathing session. Pt requires MIN A for LB bathing seated EOC. MIN A + RW perihygiene in standing, assist for standing portion. MIN A + RW for ADL t/f and ~25 ft mobility. Pt making good progress toward goals. Pt continues to benefit from skilled OT services to maximize return to PLOF and minimize risk of future falls, injury, caregiver burden, and readmission. Will continue to follow POC. Discharge recommendation remains appropriate.    Follow Up Recommendations  SNF;Supervision/Assistance - 24 hour    Equipment Recommendations  3 in 1 bedside commode    Recommendations for Other Services      Precautions / Restrictions Precautions Precautions: Fall Restrictions Weight Bearing Restrictions: No       Mobility Bed Mobility               General bed mobility comments: pt received and left in chair    Transfers Overall transfer level: Needs assistance Equipment used: Rolling walker (2 wheeled) Transfers: Sit to/from Stand Sit to Stand: Min assist         General transfer comment: slow to rise, requires MIN physical assist and cues for sequencing hand placement    Balance Overall balance assessment: Needs assistance Sitting-balance support: Bilateral upper extremity supported;Feet supported Sitting balance-Leahy Scale: Fair     Standing balance support: Single extremity supported;During functional  activity Standing balance-Leahy Scale: Fair                             ADL either performed or assessed with clinical judgement   ADL Overall ADL's : Needs assistance/impaired                                       General ADL Comments: MIN A for LB bathing seated EOC. MIN A + RW perihygiene in standing, assist for standing portion. MIN A + RW for ADL t/f and ~25 ft mobility      Cognition Arousal/Alertness: Awake/alert Behavior During Therapy: WFL for tasks assessed/performed Overall Cognitive Status: Impaired/Different from baseline Area of Impairment: Following commands;Safety/judgement;Awareness                       Following Commands: Follows one step commands consistently;Follows one step commands with increased time Safety/Judgement: Decreased awareness of safety;Decreased awareness of deficits              Exercises Exercises: Other exercises Other Exercises Other Exercises: Pt educated re: d/c recs, falls prevention, ECS, HEP Other Exercises: LBD, UBD, bathing, hair brushing, sit<>stand x2, perihyigiene, ~25 ft mobility           Pertinent Vitals/ Pain       Pain Assessment: No/denies pain         Frequency  Min 2X/week        Progress Toward Goals  OT Goals(current  goals can now be found in the care plan section)  Progress towards OT goals: Progressing toward goals  Acute Rehab OT Goals Patient Stated Goal: to get better OT Goal Formulation: With patient Time For Goal Achievement: 11/25/20 Potential to Achieve Goals: Good ADL Goals Pt Will Perform Grooming: standing;with modified independence Pt Will Perform Lower Body Dressing: Independently;sit to/from stand Pt Will Perform Toileting - Clothing Manipulation and hygiene: with modified independence;sitting/lateral leans  Plan Frequency needs to be updated;Discharge plan remains appropriate       AM-PAC OT "6 Clicks" Daily Activity     Outcome  Measure   Help from another person eating meals?: A Little Help from another person taking care of personal grooming?: A Little Help from another person toileting, which includes using toliet, bedpan, or urinal?: A Little Help from another person bathing (including washing, rinsing, drying)?: A Little Help from another person to put on and taking off regular upper body clothing?: A Little Help from another person to put on and taking off regular lower body clothing?: A Little 6 Click Score: 18    End of Session Equipment Utilized During Treatment: Rolling walker  OT Visit Diagnosis: Other abnormalities of gait and mobility (R26.89);Muscle weakness (generalized) (M62.81)   Activity Tolerance Patient tolerated treatment well   Patient Left in chair;with call bell/phone within reach;with chair alarm set   Nurse Communication          Time: 1030-1055 OT Time Calculation (min): 25 min  Charges: OT General Charges $OT Visit: 1 Visit OT Treatments $Self Care/Home Management : 23-37 mins  Kathie Dike, M.S. OTR/L  11/18/20, 2:02 PM  ascom [phone]

## 2020-11-18 NOTE — Progress Notes (Signed)
PROGRESS NOTE    Brad Reed  YQI:[id number] DOB: [birth year] DOA: 11/08/2020 PCP: Karie Schwalbe, MD   Chief complaint.  Nausea vomiting. Brief Narrative:  Brad Reed is a 86 y.o. male with medical history significant for essential hypertension, diverticulosis, who is admitted to [facility] on 11/08/2020 with severe sepsis in the setting of acute transaminitis after presenting from home to [facility] ED complaining of nausea/vomiting.  Patient states that that he had a liver abscess a year ago which has since resolved. Upon arrival to emergency room, patient had a significant jaundice with a bilirubin of 8.4, MRCP showed stone in the common bile duct.  Patient has been evaluated by GI, successful ERCP on 6/28. Cholecystectomy was performed on 6/29, showed a significant inflammation and damage to the stomach and duodenum due to adhesion from inflamed gallbladder.  He is treated with Zosyn, then changed to Augmentin.  Final dose 11/19/2020.   Assessment & Plan:   Principal Problem:   Adjustment disorder with mixed emotional features Active Problems:   Essential hypertension, benign   Severe sepsis (HCC)   Transaminitis   Nausea & vomiting   Generalized weakness   Lactic acidosis   Choledocholithiasis   Acute cholecystitis  #1.  Severe sepsis. Choledocholithiasis with cholecystitis. Patient condition had improved after ERCP, cholecystectomy and duodenal repair Patient condition has resolved, still on oral antibiotics, last treatment 7/7.  #2.  Generalized weakness. Continue PT/OT.  Patient is pending for nursing home placement.  3.  Chronic alcohol abuse. Hypokalemia Hypophosphatemia. --monitor   DVT prophylaxis: Lovenox Code Status: DNR Family Communication:  Disposition Plan:    Status is: Inpatient  Remains inpatient appropriate because:Unsafe d/c plan  Dispo: The patient is from: Home              Anticipated d/c is to: SNF               Patient currently is medically stable to d/c.   Difficult to place patient No     I/O last 3 completed shifts: In: 1477 [P.O.:1177; IV Piggyback:300] Out: 0  No intake/output data recorded.     Consultants:  General surgery  Procedures: ERCP, cholecystectomy with duodenal repair.  Antimicrobials: Augmentin.  Subjective: Doing ok.  Eating ok.  Pain with abdomen only when moved.     Objective: Vitals:   11/18/20 0359 11/18/20 0729 11/18/20 1202 11/18/20 1659  BP: (!) 160/79 (!) 155/76 (!) 156/81 (!) 161/88  Pulse: 74 66 84 73  Resp:  18 17 18   Temp: 98.2 F (36.8 C) 98.4 F (36.9 C)  97.9 F (36.6 C)  TempSrc: Oral Oral    SpO2: 96% 96% 96% 97%  Weight:      Height:        Intake/Output Summary (Last 24 hours) at 11/18/2020 1955 Last data filed at 11/18/2020 1843 Gross per 24 hour  Intake 357 ml  Output 0 ml  Net 357 ml   Filed Weights   11/16/20 0500 11/17/20 0500 11/18/20 0118  Weight: 126 kg 123.9 kg 122.5 kg    Examination:  Constitutional: NAD, AAOx3 HEENT: conjunctivae and lids normal, EOMI CV: No cyanosis.   RESP: normal respiratory effort, on RA GI: drain present ouputting SS fluids Extremities: mild edema in BLE SKIN: warm, dry Neuro: II - XII grossly intact.   Psych: Normal mood and affect.  Appropriate judgement and reason    Data Reviewed: I have personally reviewed following labs and imaging studies  CBC: Recent Labs  Lab 11/12/20 0643 11/13/20 0602 11/14/20 0631 11/18/20 0535  WBC 12.1* 10.1 10.0 9.0  NEUTROABS 10.4* 7.6 6.8 5.7  HGB 13.7 13.5 12.9* 13.5  HCT 40.1 39.1 37.4* 39.5  MCV 97.1 97.5 97.7 96.1  PLT 208 160 174 280   Basic Metabolic Panel: Recent Labs  Lab 11/13/20 0602 11/14/20 0631 11/15/20 0635 11/16/20 0557 11/17/20 0708 11/18/20 0535  NA 138 140 138 138 139 138  K 3.2* 3.2* 3.3* 3.4* 3.4* 3.5  CL 105 105 102 103 104 104  CO2 26 28 31 28 31 30   GLUCOSE 96 104* 106* 97 99 92  BUN 17 12 11 12 11 12    CREATININE 0.95 0.81 0.93 0.72 0.65 0.70  CALCIUM 8.0* 8.2* 8.4* 8.2* 8.4* 8.3*  MG 1.8 1.8 1.9 1.8 2.0  --   PHOS  --  1.9* 2.7 2.6 2.6  --    GFR: Estimated Creatinine Clearance: 90.2 mL/min (by C-G formula based on SCr of 0.7 mg/dL). Liver Function Tests: Recent Labs  Lab 11/12/20 0643  AST 56*  ALT 125*  ALKPHOS 93  BILITOT 3.3*  PROT 5.4*  ALBUMIN 2.5*   No results for input(s): LIPASE, AMYLASE in the last 168 hours. No results for input(s): AMMONIA in the last 168 hours. Coagulation Profile: No results for input(s): INR, PROTIME in the last 168 hours. Cardiac Enzymes: No results for input(s): CKTOTAL, CKMB, CKMBINDEX, TROPONINI in the last 168 hours. BNP (last 3 results) No results for input(s): PROBNP in the last 8760 hours. HbA1C: No results for input(s): HGBA1C in the last 72 hours. CBG: No results for input(s): GLUCAP in the last 168 hours. Lipid Profile: No results for input(s): CHOL, HDL, LDLCALC, TRIG, CHOLHDL, LDLDIRECT in the last 72 hours. Thyroid Function Tests: No results for input(s): TSH, T4TOTAL, FREET4, T3FREE, THYROIDAB in the last 72 hours. Anemia Panel: No results for input(s): VITAMINB12, FOLATE, FERRITIN, TIBC, IRON, RETICCTPCT in the last 72 hours. Sepsis Labs: No results for input(s): PROCALCITON, LATICACIDVEN in the last 168 hours.  Recent Results (from the past 240 hour(s))  Urine culture     Status: Abnormal   Collection Time: 11/08/20  9:53 PM   Specimen: In/Out Cath Urine  Result Value Ref Range Status   Specimen Description   Final    IN/OUT CATH URINE Performed at [facility], [address] [place], [address] [place]    Special Requests   Final    NONE Performed at [facility], [address] [place], [address] [place]    Culture MULTIPLE SPECIES PRESENT, SUGGEST RECOLLECTION (A)  Final   Report Status 11/10/2020 FINAL  Final  CULTURE, BLOOD (ROUTINE X 2) w Reflex to ID Panel     Status: None    Collection Time: 11/09/20  2:22 PM   Specimen: BLOOD RIGHT HAND  Result Value Ref Range Status   Specimen Description BLOOD RIGHT HAND  Final   Special Requests   Final    BOTTLES DRAWN AEROBIC AND ANAEROBIC Blood Culture results may not be optimal due to an inadequate volume of blood received in culture bottles   Culture   Final    NO GROWTH 5 DAYS Performed at [facility], [address], [place], [address] [place]    Report Status 11/14/2020 FINAL  Final  CULTURE, BLOOD (ROUTINE X 2) w Reflex to ID Panel     Status: None   Collection Time: 11/09/20  2:22 PM   Specimen:  BLOOD LEFT HAND  Result Value Ref Range Status   Specimen Description BLOOD LEFT HAND  Final   Special Requests   Final    BOTTLES DRAWN AEROBIC AND ANAEROBIC Blood Culture results may not be optimal due to an inadequate volume of blood received in culture bottles   Culture   Final    NO GROWTH 5 DAYS Performed at [facility], [address], [place], [place] [address]    Report Status 11/14/2020 FINAL  Final         Radiology Studies: No results found.      Scheduled Meds:  amoxicillin-clavulanate  1 tablet Oral Q12H   enoxaparin (LOVENOX) injection  0.5 mg/kg Subcutaneous Q24H   Gerhardt's butt cream   Topical BID   guaiFENesin  600 mg Oral BID   thiamine  100 mg Oral Daily   Continuous Infusions:     LOS: 10 days    Darlin Priestly, MD [facility]   To contact the attending provider between 7A-7P or the covering provider during after hours 7P-7A, please log into the web site [email or web address] and access using universal [place] password for that web site. If you do not have the password, please call the hospital operator.  11/18/2020, 7:55 PM

## 2020-11-18 NOTE — Progress Notes (Signed)
Pt is A&O, VS stable, NSR on the monitor. RA. IV in place. No complaints of pain or discomfort tonight. Incontinent of urine multiple times, slept well

## 2020-11-19 DIAGNOSIS — K810 Acute cholecystitis: Secondary | ICD-10-CM | POA: Diagnosis not present

## 2020-11-19 DIAGNOSIS — A419 Sepsis, unspecified organism: Secondary | ICD-10-CM | POA: Diagnosis not present

## 2020-11-19 DIAGNOSIS — F4329 Adjustment disorder with other symptoms: Secondary | ICD-10-CM | POA: Diagnosis not present

## 2020-11-19 DIAGNOSIS — M2550 Pain in unspecified joint: Secondary | ICD-10-CM | POA: Diagnosis not present

## 2020-11-19 DIAGNOSIS — R0902 Hypoxemia: Secondary | ICD-10-CM | POA: Diagnosis not present

## 2020-11-19 DIAGNOSIS — Z7401 Bed confinement status: Secondary | ICD-10-CM | POA: Diagnosis not present

## 2020-11-19 DIAGNOSIS — I10 Essential (primary) hypertension: Secondary | ICD-10-CM | POA: Diagnosis not present

## 2020-11-19 DIAGNOSIS — M6281 Muscle weakness (generalized): Secondary | ICD-10-CM | POA: Diagnosis not present

## 2020-11-19 LAB — BASIC METABOLIC PANEL
Anion gap: 5 (ref 5–15)
BUN: 10 mg/dL (ref 8–23)
CO2: 30 mmol/L (ref 22–32)
Calcium: 8.5 mg/dL — ABNORMAL LOW (ref 8.9–10.3)
Chloride: 104 mmol/L (ref 98–111)
Creatinine, Ser: 0.69 mg/dL (ref 0.61–1.24)
GFR, Estimated: >60 mL/min (ref >60)
Glucose, Bld: 103 mg/dL — ABNORMAL HIGH (ref 70–99)
Potassium: 3.6 mmol/L (ref 3.5–5.1)
Sodium: 139 mmol/L (ref 135–145)

## 2020-11-19 LAB — CBC
HCT: 40.7 % (ref 39.0–52.0)
Hemoglobin: 13.8 g/dL (ref 13.0–17.0)
MCH: 32.9 pg (ref 26.0–34.0)
MCHC: 33.9 g/dL (ref 30.0–36.0)
MCV: 97.1 fL (ref 80.0–100.0)
Platelets: 274 10*3/uL (ref 150–400)
RBC: 4.19 MIL/uL — ABNORMAL LOW (ref 4.22–5.81)
RDW: 12.3 % (ref 11.5–15.5)
WBC: 9.3 10*3/uL (ref 4.0–10.5)
nRBC: 0 % (ref 0.0–0.2)

## 2020-11-19 LAB — MAGNESIUM: Magnesium: 2.2 mg/dL (ref 1.7–2.4)

## 2020-11-19 MED ORDER — TRIAMTERENE-HCTZ 37.5-25 MG PO TABS
1.0000 | ORAL_TABLET | Freq: Every day | ORAL | Status: DC
  Administered 2020-11-19: 1 via ORAL
  Filled 2020-11-19: qty 1

## 2020-11-19 MED ORDER — THIAMINE HCL 100 MG PO TABS: 100.0000 mg | ORAL_TABLET | Freq: Every day | ORAL | Status: AC

## 2020-11-19 NOTE — Progress Notes (Signed)
Physical Therapy Treatment Patient Details Name: Brad Reed MRN: [id number] DOB: 02-Sep-1934 Today's Date: 11/19/2020    History of Present Illness Harlon Kutner is an 86yoM admitted on 11/08/20 with c/c of N&V, treated for severe sepsis. Imaging revealed choledocholithiasis & cholelithiasis (underwent ERCP on 6/28). PMH: HTN, diverticulosis, knee arthritis, macular degeneration, obesity, peripheral neuropathy, sleep apnea.    PT Comments    Pt in recliner asking to get back to bed upon arrival.  Stated he has walked to/from bathroom (not commode) per pt but it is questionable as bathroom is not set up to do so and RN could not confirm accuracy.  He does stand with min a x 1 and increased time and is able to transfer back to bed with min a x 1 mostly for hand and walker placement as he pushes walker very far out in front of him while attempting to turn.  Participated in exercises as described below.  Returned to supine with min a x 1 after declining further gait.   Follow Up Recommendations  Supervision - Intermittent;SNF     Equipment Recommendations  None recommended by PT    Recommendations for Other Services       Precautions / Restrictions Precautions Precautions: Fall Restrictions Weight Bearing Restrictions: No    Mobility  Bed Mobility Overal bed mobility: Needs Assistance         Sit to supine: Min assist        Transfers Overall transfer level: Needs assistance Equipment used: Rolling walker (2 wheeled) Transfers: Sit to/from Stand Sit to Stand: Min assist         General transfer comment: slow to rise, requires MIN physical assist and cues for sequencing hand placement  Ambulation/Gait Ambulation/Gait assistance: Min assist Gait Distance (Feet): 3 Feet Assistive device: Rolling walker (2 wheeled) Gait Pattern/deviations: Step-to pattern     General Gait Details: cues to keep walker close - pushes it too far out in front   Stairs              Wheelchair Mobility    Modified Rankin (Stroke Patients Only)       Balance Overall balance assessment: Needs assistance Sitting-balance support: Bilateral upper extremity supported;Feet supported Sitting balance-Leahy Scale: Fair     Standing balance support: Bilateral upper extremity supported Standing balance-Leahy Scale: Fair                              Cognition Arousal/Alertness: Awake/alert   Overall Cognitive Status: Impaired/Different from baseline                                        Exercises Other Exercises Other Exercises: seated AROM on bed x 10    General Comments        Pertinent Vitals/Pain Pain Assessment: No/denies pain    Home Living                      Prior Function            PT Goals (current goals can now be found in the care plan section) Progress towards PT goals: Progressing toward goals    Frequency    Min 2X/week      PT Plan Current plan remains appropriate    Co-evaluation  AM-PAC PT "6 Clicks" Mobility   Outcome Measure  Help needed turning from your back to your side while in a flat bed without using bedrails?: A Little Help needed moving from lying on your back to sitting on the side of a flat bed without using bedrails?: A Little Help needed moving to and from a bed to a chair (including a wheelchair)?: A Little Help needed standing up from a chair using your arms (e.g., wheelchair or bedside chair)?: A Lot Help needed to walk in hospital room?: A Little Help needed climbing 3-5 steps with a railing? : Total 6 Click Score: 15    End of Session Equipment Utilized During Treatment: Gait belt Activity Tolerance: Patient tolerated treatment well;No increased pain Patient left: in bed;with call bell/phone within reach;with bed alarm set Nurse Communication: Mobility status PT Visit Diagnosis: Muscle weakness (generalized) (M62.81);Unsteadiness on feet  (R26.81)     Time: 9622-2979 PT Time Calculation (min) (ACUTE ONLY): 8 min  Charges:  $Therapeutic Exercise: 8-22 mins                    Danielle Dess, PTA 11/19/20, 12:31 PM , 12:28 PM

## 2020-11-19 NOTE — TOC Transition Note (Signed)
Transition of Care [facility]) - CM/SW Discharge Note   Patient Details  Name: Brad Reed MRN: [id number] Date of Birth: [birth year]  [facility] CM/SW Contact:  Gildardo Griffes, LCSW Phone Number: 11/19/2020, 2:14 PM   Clinical Narrative:     Patient will DC to: [facility] Anticipated DC date: 11/19/20 Family notified:son Transport [id number]  Per MD patient ready for DC to [facility]. RN, patient, patient's family, and facility notified of DC. Discharge Summary sent to facility. RN given number for report.    [phone] Room 809 DC packet on chart. Ambulance transport requested for patient.  CSW signing off.  Angeline Slim, LCSW    Final next level of care: Skilled Nursing Facility Barriers to Discharge: No Barriers Identified   Patient Goals and CMS Choice Patient states their goals for this hospitalization and ongoing recovery are:: to go to SNF CMS Medicare.gov Compare Post Acute Care list provided to:: Patient Represenative (must comment) (son) Choice offered to / list presented to : Adult Children  Discharge Placement              Patient chooses bed at: [facility] [place] Patient to be transferred to facility by: [facility] Name of family member notified: son Patient and family notified of of transfer: 11/19/20  Discharge Plan and Services     Post Acute Care Choice:  (TBD)                               Social Determinants of Health (SDOH) Interventions     Readmission Risk Interventions No flowsheet data found.

## 2020-11-19 NOTE — Consult Note (Signed)
WOC Nurse Consult Note: Reason for Consult:intertriginous dermatitis at the subpannicular skin fold Wound type: MASD, ITD  ICD-10 CM Codes for Irritant Dermatitis  L30.4  - Erythema intertrigo. Also used for abrasion of the hand, chafing of the skin, dermatitis due to sweating and friction, friction dermatitis, friction eczema, and genital/thigh intertrigo.   Pressure Injury POA:N/A Measurement:N/A Wound bed:N/A Drainage (amount, consistency, odor) N/A Periwound: discolored from months of intertriginous dermatitis due to poor hygiene in this area and and sweat accumulation Dressing procedure/placement/frequency: I have provided patient with house antimicrobial wicking textile, InterDry.  Instructions for use are:  Measure and cut length of InterDry to fit in skin folds that have skin breakdown Tuck InterDry fabric into skin folds in a single layer, allow for 2 inches of overhang from skin edges to allow for wicking to occur May remove to bathe; dry area thoroughly and then tuck into affected areas again Do not apply any creams or ointments when using InterDry DO NOT THROW AWAY FOR 5 DAYS unless soiled with stool DO NOT [facility] product, this will inactivate the silver in the material  New sheet of Interdry should be applied after 5 days of use if patient continues to have skin breakdown     WOC nursing team will not follow, but will remain available to this patient, the nursing and medical teams.  Please re-consult if needed. Thanks, Ladona Mow, MSN, RN, GNP, [place]  Pager# [phone]

## 2020-11-19 NOTE — Care Management Important Message (Signed)
Important Message  Patient Details  Name: Brad Reed MRN: [id number] Date of Birth: January 27, 1935   Medicare Important Message Given:  Yes     [name] 11/19/2020, 2:22 PM

## 2020-11-19 NOTE — Plan of Care (Signed)
Pt assisted up to [facility] x 2 during the night.  He remains incontinent of bowel and bladder most of the time.  JP intact.  He denies any pain.  Hilton Sinclair BSN RN CMSRN   Problem: Activity: Goal: Risk for activity intolerance will decrease Outcome: Progressing

## 2020-11-19 NOTE — Discharge Summary (Addendum)
Physician Discharge Summary   Brad Reed  male DOB: 1934/09/22  VEH:[id number]  PCP: Karie Schwalbe, MD  Admit date: 11/08/2020 Discharge date: 11/19/2020  Admitted From: home Disposition:  SNF CODE STATUS: DNR  Discharge Instructions     Discharge wound care:   Complete by: As directed    Abdominal drain:  empty the bulb, flush the line, q8h - -   Discharge wound care:   Complete by: As directed    Intertriginous dermatitis at the subpannicular skin fold:  Measure and cut length of InterDry to fit in skin folds that have skin breakdown Tuck InterDry fabric into skin folds in a single layer, allow for 2 inches of overhang from skin edges to allow for wicking to occur May remove to bathe; dry area thoroughly and then tuck into affected areas again Do not apply any creams or ointments when using InterDry DO NOT THROW AWAY FOR 5 DAYS unless soiled with stool DO NOT [facility] product, this will inactivate the silver in the material New sheet of Interdry should be applied after 5 days of use if patient continues to have skin breakdown [facility] Course:  For full details, please see H&P, progress notes, consult notes and ancillary notes.  Briefly,  Brad Reed is a 86 y.o. male with medical history significant for essential hypertension, diverticulosis, who is admitted to [facility] on 11/08/2020 with severe sepsis in the setting of acute transaminitis after presenting from home to [facility] ED complaining of nausea/vomiting.   Severe sepsis. Choledocholithiasis with cholecystitis. S/p ERCP, cholecystectomy and  Upon arrival to emergency room, patient had a significant jaundice with a bilirubin of 8.4, MRCP showed stone in the common bile duct.  Patient has been evaluated by GI, successful ERCP on 6/28. Cholecystectomy was performed on 6/29, showed a significant inflammation and damage to the stomach and duodenum due to adhesion from inflamed  gallbladder.  He is treated with Zosyn, then changed to Augmentin.  Final dose 11/19/2020.  Abdominal drain will stay until outpatient followup with surgery.  Generalized weakness. PT/OT.  Discharge to SNF rehab.  Chronic alcohol abuse --cont oral thiamine  Hypokalemia, resolved Hypophosphatemia, resolved --monitored and repleted PRN.    Essential hypertension Home Maxzide held during hospitalization, resumed prior to discharge.    Discharge Diagnoses:  Principal Problem:   Adjustment disorder with mixed emotional features Active Problems:   Essential hypertension, benign   Severe sepsis (HCC)   Transaminitis   Nausea & vomiting   Generalized weakness   Lactic acidosis   Choledocholithiasis   Acute cholecystitis   30 Day Unplanned Readmission Risk Score    Flowsheet Row ED to Hosp-Admission (Current) from 11/08/2020 in [facility]  30 Day Unplanned Readmission Risk Score (%) 10.43 Filed at 11/19/2020 0801       This score is the patient's risk of an unplanned readmission within 30 days of being discharged (0 -100%). The score is based on dignosis, age, lab data, medications, orders, and past utilization.   Low:  0-14.9   Medium: 15-21.9   High: 22-29.9   Extreme: 30 and above          Discharge Instructions:  Allergies as of 11/19/2020   No Active Allergies      Medication List     STOP taking these medications    potassium chloride 10 MEQ tablet Commonly known as: KLOR-CON  TAKE these medications    multivitamin-lutein Caps capsule Take 1 capsule by mouth daily.   thiamine 100 MG tablet Take 1 tablet (100 mg total) by mouth daily.   triamterene-hydrochlorothiazide 37.5-25 MG capsule Commonly known as: DYAZIDE Take 1 each (1 capsule total) by mouth daily.               Discharge Care Instructions  (From admission, onward)           Start     Ordered   11/19/20 0000  Discharge wound care:        Comments: Abdominal drain:  empty the bulb, flush the line, q8h - -   11/19/20 1150   11/19/20 0000  Discharge wound care:       Comments: Intertriginous dermatitis at the subpannicular skin fold:  Measure and cut length of InterDry to fit in skin folds that have skin breakdown Tuck InterDry fabric into skin folds in a single layer, allow for 2 inches of overhang from skin edges to allow for wicking to occur May remove to bathe; dry area thoroughly and then tuck into affected areas again Do not apply any creams or ointments when using InterDry DO NOT THROW AWAY FOR 5 DAYS unless soiled with stool DO NOT [facility] product, this will inactivate the silver in the material New sheet of Interdry should be applied after 5 days of use if patient continues to have skin breakdown - -   11/19/20 1150             Follow-up Information     Tonna Boehringer, Isami, DO Follow up on 11/23/2020.   Specialty: Surgery Why: for drain check, after lap chole Contact information: [address] [place] [address] [phone]                 No Active Allergies   The results of significant diagnostics from this hospitalization (including imaging, microbiology, ancillary and laboratory) are listed below for reference.   Consultations:   Procedures/Studies: CT HEAD WO CONTRAST  Result Date: 11/13/2020 CLINICAL DATA:  Right facial droop EXAM: CT HEAD WITHOUT CONTRAST TECHNIQUE: Contiguous axial images were obtained from the base of the skull through the vertex without intravenous contrast. COMPARISON:  CT 11/08/2020 FINDINGS: Brain: No acute territorial infarction, hemorrhage, or intracranial advanced atrophy. Moderate chronic small vessel ischemic changes of the white matter. Multiple probably chronic bilateral basal ganglial lacunar infarcts. Stable ventricle size. Vascular: No hyperdense vessels.  Carotid vascular calcification Skull: Negative for fracture or focal lesion. Sinuses/Orbits:  Mucosal thickening in the sinuses Other: None IMPRESSION: 1. No CT evidence for acute intracranial abnormality. 2. Atrophy and chronic small vessel ischemic changes of the white matter. Multiple chronic appearing lacunar infarcts in the basal ganglia. Electronically Signed   By: Jasmine Pang M.D.   On: 11/13/2020 17:44   CT Head Wo Contrast  Result Date: 11/08/2020 CLINICAL DATA:  Neuro deficit, acute, stroke suspected EXAM: CT HEAD WITHOUT CONTRAST TECHNIQUE: Contiguous axial images were obtained from the base of the skull through the vertex without intravenous contrast. COMPARISON:  None. FINDINGS: Brain: There is atrophy and chronic small vessel disease changes. No acute intracranial abnormality. Specifically, no hemorrhage, hydrocephalus, mass lesion, acute infarction, or significant intracranial injury. Vascular: No hyperdense vessel or unexpected calcification. Skull: No acute calvarial abnormality. Sinuses/Orbits: Visualized paranasal sinuses and mastoids clear. Orbital soft tissues unremarkable. Other: None IMPRESSION: Atrophy, chronic microvascular disease. No acute intracranial abnormality. Electronically Signed   By: Charlett Nose M.D.  On: 11/08/2020 19:50   CT Angio Chest PE W and/or Wo Contrast  Result Date: 11/08/2020 CLINICAL DATA:  PE suspected, high probability EXAM: CT ANGIOGRAPHY CHEST WITH CONTRAST TECHNIQUE: Multidetector CT imaging of the chest was performed using the standard protocol during bolus administration of intravenous contrast. Multiplanar CT image reconstructions and MIPs were obtained to evaluate the vascular anatomy. CONTRAST:  100mL OMNIPAQUE IOHEXOL 350 MG/ML SOLN COMPARISON:  None. FINDINGS: Cardiovascular: No filling defects in the pulmonary arteries to suggest pulmonary emboli. Mild cardiomegaly. Moderate coronary artery and aortic calcifications. Mild aneurysmal dilatation aorta, 4.2 cm. Of the ascending thoracic Mediastinum/Nodes: No mediastinal, hilar, or axillary  adenopathy. Trachea and esophagus are unremarkable. Thyroid unremarkable. Lungs/Pleura: Lungs are clear. No focal airspace opacities or suspicious nodules. No effusions. Upper Abdomen: Imaging into the upper abdomen demonstrates no acute findings. Musculoskeletal: 2 Chest wall soft tissues are unremarkable. No acute bony abnormality. Review of the MIP images confirms the above findings. IMPRESSION: No evidence of pulmonary embolus. Cardiomegaly, coronary artery disease. 4.2 cm ascending thoracic aortic aneurysm. Recommend annual imaging followup by CTA or MRA. This recommendation follows 2010 ACCF/AHA/AATS/ACR/ASA/SCA/SCAI/SIR/STS/SVM Guidelines for the Diagnosis and Management of Patients with Thoracic Aortic Disease. Circulation. 2010; 121: Z610-R604: E266-e369. Aortic aneurysm NOS (ICD10-I71.9) Aortic Atherosclerosis (ICD10-I70.0). Electronically Signed   By: Charlett NoseKevin  Dover M.D.   On: 11/08/2020 19:58   CT ABDOMEN PELVIS W CONTRAST  Result Date: 11/08/2020 CLINICAL DATA:  Abdominal pain EXAM: CT ABDOMEN AND PELVIS WITH CONTRAST TECHNIQUE: Multidetector CT imaging of the abdomen and pelvis was performed using the standard protocol following bolus administration of intravenous contrast. CONTRAST:  100mL OMNIPAQUE IOHEXOL 350 MG/ML SOLN COMPARISON:  None. FINDINGS: Lower chest: No acute abnormality.  Aortic atherosclerosis. Hepatobiliary: No focal hepatic abnormality. Gallbladder unremarkable. Pancreas: No focal abnormality or ductal dilatation. Spleen: No focal abnormality.  Normal size. Adrenals/Urinary Tract: No adrenal abnormality. No focal renal abnormality. No stones or hydronephrosis. Urinary bladder is unremarkable. Stomach/Bowel: Stomach, large and small bowel grossly unremarkable. Vascular/Lymphatic: Aortic atherosclerosis. No evidence of aneurysm or adenopathy. Reproductive: Mild prostate enlargement with calcifications. Other: No free fluid or free air. Musculoskeletal: No acute bony abnormality. IMPRESSION: No  acute findings in the abdomen or pelvis. Aortic atherosclerosis. Prostate enlargement. Electronically Signed   By: Charlett NoseKevin  Dover M.D.   On: 11/08/2020 20:01   DG Chest Port 1 View  Result Date: 11/14/2020 CLINICAL DATA:  Cough. EXAM: PORTABLE CHEST 1 VIEW COMPARISON:  Chest x-ray 11/08/2020, CT chest 11/08/2020 FINDINGS: Enlarged cardiac silhouette. The heart size and mediastinal contours are unchanged. Aortic calcifications. No focal consolidation. No pulmonary edema. No pleural effusion. No pneumothorax. No acute osseous abnormality. IMPRESSION: No active disease. Electronically Signed   By: Tish FredericksonMorgane  Naveau M.D.   On: 11/14/2020 22:18   DG Chest Portable 1 View  Result Date: 11/08/2020 CLINICAL DATA:  Weakness. EXAM: PORTABLE CHEST 1 VIEW COMPARISON:  July 02, 2018 FINDINGS: Stable cardiomegaly. The hila and mediastinum are unchanged. No pneumothorax. No nodules or masses. No focal infiltrates. No overt edema. IMPRESSION: No active disease. Electronically Signed   By: Gerome Samavid  Williams III M.D   On: 11/08/2020 18:54   DG C-Arm 1-60 Min-No Report  Result Date: 11/10/2020 Fluoroscopy was utilized by the requesting physician.  No radiographic interpretation.   MR ABDOMEN WITH MRCP W CONTRAST  Result Date: 11/08/2020 CLINICAL DATA:  Right upper quadrant abdominal pain, transaminitis EXAM: MRI ABDOMEN WITH CONTRAST (WITH MRCP) TECHNIQUE: Multiplanar multisequence MR imaging of the abdomen was performed following the administration of intravenous  contrast. Heavily T2-weighted images of the biliary and pancreatic ducts were obtained, and three-dimensional MRCP images were rendered by post processing. CONTRAST:  10mL GADAVIST GADOBUTROL 1 MMOL/ML IV SOLN COMPARISON:  None. FINDINGS: Lower chest: No pleural effusion.  Mild global cardiomegaly. Hepatobiliary: There is limited evaluation of the hepatic parenchyma due to respiratory motion artifact with particular limitation of the a in opposed phase images.  No focal intrahepatic masses identified. There is no intrahepatic biliary ductal dilation. The main and intrahepatic portal venous structures are patent. Cholelithiasis noted. Additionally, there are at least 3 intraluminal filling defects identified measuring up to 6 mm within the distal common duct in keeping with changes of cholelithiasis. These are best noted on image # 24/3 and image # 17 and 20/4. There is mild peribiliary edema identified involving the distal duct and within the pancreatico duodenal groove in keeping with mild cholangitis. The gallbladder itself is contracted and no pericholecystic inflammatory changes identified. Pancreas: Inflammatory changes within common duct distally and within the pancreatico duodenal groove do not appear to extend to involve the head of the pancreas. The pancreas is otherwise unremarkable. Spleen:  Unremarkable Adrenals/Urinary Tract: The adrenal glands are unremarkable. Multiple simple tiny cortical cysts are seen arising from the right kidney. The kidneys are otherwise unremarkable. Stomach/Bowel: Visualized portions within the abdomen are unremarkable. Vascular/Lymphatic: Retroaortic left renal vein. The abdominal vasculature is otherwise unremarkable. No pathologic adenopathy within the upper abdomen. Other:  None. Musculoskeletal: Multiple lesions are identified within the visualized thoracolumbar spine most in keeping with intraosseous hemangioma, the largest seen within the T9 vertebral body. Additional smaller lesions are noted within T10 T12 and L2. No suspicious focal lesions are identified. IMPRESSION: Choledocholithiasis with at least 3 intraluminal calculi within the distal common duct measuring up to 7 mm in greatest dimension. Mild peribiliary inflammatory change involving the distal common duct and extending into the pancreatico duodenal groove. Cholelithiasis. Mild cardiomegaly. Electronically Signed   By: Helyn Numbers MD   On: 11/08/2020 23:19    US ABDOMEN LIMITED RUQ (LIVER/GB)  Result Date: 11/08/2020 CLINICAL DATA:  Transaminitis EXAM: ULTRASOUND ABDOMEN LIMITED RIGHT UPPER QUADRANT COMPARISON:  None. FINDINGS: Gallbladder: Gallbladder is contracted with several stones measuring up to 1.5 cm. Gallbladder wall mildly thickened at 4 mm. Common bile duct: Diameter: Normal caliber, 5 mm Liver: No focal lesion identified. Within normal limits in parenchymal echogenicity. Portal vein is patent on color Doppler imaging with normal direction of blood flow towards the liver. Other: None. IMPRESSION: Cholelithiasis.  No sonographic evidence of acute cholecystitis. Electronically Signed   By: Charlett Nose M.D.   On: 11/08/2020 20:04      Labs: BNP (last 3 results) Recent Labs    11/08/20 1810  BNP 196.9*   Basic Metabolic Panel: Recent Labs  Lab 11/14/20 0631 11/15/20 0635 11/16/20 0557 11/17/20 0708 11/18/20 0535 11/19/20 0700  NA 140 138 138 139 138 139  K 3.2* 3.3* 3.4* 3.4* 3.5 3.6  CL 105 102 103 104 104 104  CO2 GLUCOSE 104* 106* 97 99 92 103*  BUN CREATININE 0.81 0.93 0.72 0.65 0.70 0.69  CALCIUM 8.2* 8.4* 8.2* 8.4* 8.3* 8.5*  MG 1.8 1.9 1.8 2.0  --  2.2  PHOS 1.9* 2.7 2.6 2.6  --   --    Liver Function Tests: No results for input(s): AST, ALT, ALKPHOS, BILITOT, PROT, ALBUMIN in the last 168 hours. No results for  input(s): LIPASE, AMYLASE in the last 168 hours. No results for input(s): AMMONIA in the last 168 hours. CBC: Recent Labs  Lab 11/13/20 0602 11/14/20 0631 11/18/20 0535 11/19/20 0700  WBC 10.1 10.0 9.0 9.3  NEUTROABS 7.6 6.8 5.7  --   HGB 13.5 12.9* 13.5 13.8  HCT 39.1 37.4* 39.5 40.7  MCV 97.5 97.7 96.1 97.1  PLT 160 174 280 274   Cardiac Enzymes: No results for input(s): CKTOTAL, CKMB, CKMBINDEX, TROPONINI in the last 168 hours. BNP: Invalid input(s): POCBNP CBG: No results for input(s): GLUCAP in the last 168 hours. D-Dimer No results for input(s):  DDIMER in the last 72 hours. Hgb A1c No results for input(s): HGBA1C in the last 72 hours. Lipid Profile No results for input(s): CHOL, HDL, LDLCALC, TRIG, CHOLHDL, LDLDIRECT in the last 72 hours. Thyroid function studies No results for input(s): TSH, T4TOTAL, T3FREE, THYROIDAB in the last 72 hours.  Invalid input(s): FREET3 Anemia work up No results for input(s): VITAMINB12, FOLATE, FERRITIN, TIBC, IRON, RETICCTPCT in the last 72 hours. Urinalysis    Component Value Date/Time   COLORURINE YELLOW (A) 11/08/2020 2153   APPEARANCEUR CLEAR (A) 11/08/2020 2153   LABSPEC 1.029 11/08/2020 2153   PHURINE 6.0 11/08/2020 2153   GLUCOSEU NEGATIVE 11/08/2020 2153   HGBUR NEGATIVE 11/08/2020 2153   HGBUR negative 08/10/2009 1105   BILIRUBINUR NEGATIVE 11/08/2020 2153   KETONESUR NEGATIVE 11/08/2020 2153   PROTEINUR NEGATIVE 11/08/2020 2153   UROBILINOGEN 0.2 08/10/2009 1105   NITRITE NEGATIVE 11/08/2020 2153   LEUKOCYTESUR TRACE (A) 11/08/2020 2153   Sepsis Labs Invalid input(s): PROCALCITONIN,  WBC,  LACTICIDVEN Microbiology Recent Results (from the past 240 hour(s))  CULTURE, BLOOD (ROUTINE X 2) w Reflex to ID Panel     Status: None   Collection Time: 11/09/20  2:22 PM   Specimen: BLOOD RIGHT HAND  Result Value Ref Range Status   Specimen Description BLOOD RIGHT HAND  Final   Special Requests   Final    BOTTLES DRAWN AEROBIC AND ANAEROBIC Blood Culture results may not be optimal due to an inadequate volume of blood received in culture bottles   Culture   Final    NO GROWTH 5 DAYS Performed at [facility], [address] [place], [place] [address]    Report Status 11/14/2020 FINAL  Final  CULTURE, BLOOD (ROUTINE X 2) w Reflex to ID Panel     Status: None   Collection Time: 11/09/20  2:22 PM   Specimen: BLOOD LEFT HAND  Result Value Ref Range Status   Specimen Description BLOOD LEFT HAND  Final   Special Requests   Final    BOTTLES DRAWN AEROBIC AND ANAEROBIC Blood  Culture results may not be optimal due to an inadequate volume of blood received in culture bottles   Culture   Final    NO GROWTH 5 DAYS Performed at [facility], [address], [place], [place] [address]    Report Status 11/14/2020 FINAL  Final     Total time spend on discharging this patient, including the last patient exam, discussing the hospital stay, instructions for ongoing care as it relates to all pertinent caregivers, as well as preparing the medical discharge records, prescriptions, and/or referrals as applicable, is 35 minutes.    Darlin Priestly, MD  [facility] 11/19/2020, 11:51 AM

## 2020-11-23 ENCOUNTER — Ambulatory Visit: Payer: Medicare HMO | Admitting: Podiatry

## 2020-11-24 DIAGNOSIS — F4329 Adjustment disorder with other symptoms: Secondary | ICD-10-CM | POA: Diagnosis not present

## 2020-11-24 DIAGNOSIS — I10 Essential (primary) hypertension: Secondary | ICD-10-CM | POA: Diagnosis not present

## 2020-11-24 DIAGNOSIS — K810 Acute cholecystitis: Secondary | ICD-10-CM | POA: Diagnosis not present

## 2020-11-24 DIAGNOSIS — M6281 Muscle weakness (generalized): Secondary | ICD-10-CM | POA: Diagnosis not present

## 2020-11-25 DIAGNOSIS — F4329 Adjustment disorder with other symptoms: Secondary | ICD-10-CM | POA: Diagnosis not present

## 2020-11-25 DIAGNOSIS — M6281 Muscle weakness (generalized): Secondary | ICD-10-CM | POA: Diagnosis not present

## 2020-11-25 DIAGNOSIS — I10 Essential (primary) hypertension: Secondary | ICD-10-CM | POA: Diagnosis not present

## 2020-11-25 DIAGNOSIS — K810 Acute cholecystitis: Secondary | ICD-10-CM | POA: Diagnosis not present

## 2020-11-26 DIAGNOSIS — F4329 Adjustment disorder with other symptoms: Secondary | ICD-10-CM | POA: Diagnosis not present

## 2020-11-26 DIAGNOSIS — M6281 Muscle weakness (generalized): Secondary | ICD-10-CM | POA: Diagnosis not present

## 2020-11-26 DIAGNOSIS — K810 Acute cholecystitis: Secondary | ICD-10-CM | POA: Diagnosis not present

## 2020-11-30 DIAGNOSIS — F4329 Adjustment disorder with other symptoms: Secondary | ICD-10-CM | POA: Diagnosis not present

## 2020-11-30 DIAGNOSIS — M6281 Muscle weakness (generalized): Secondary | ICD-10-CM | POA: Diagnosis not present

## 2020-11-30 DIAGNOSIS — K810 Acute cholecystitis: Secondary | ICD-10-CM | POA: Diagnosis not present

## 2020-12-03 DIAGNOSIS — F4329 Adjustment disorder with other symptoms: Secondary | ICD-10-CM | POA: Diagnosis not present

## 2020-12-03 DIAGNOSIS — M6281 Muscle weakness (generalized): Secondary | ICD-10-CM | POA: Diagnosis not present

## 2020-12-03 DIAGNOSIS — K810 Acute cholecystitis: Secondary | ICD-10-CM | POA: Diagnosis not present

## 2020-12-04 DIAGNOSIS — K810 Acute cholecystitis: Secondary | ICD-10-CM | POA: Diagnosis not present

## 2020-12-04 DIAGNOSIS — M6281 Muscle weakness (generalized): Secondary | ICD-10-CM | POA: Diagnosis not present

## 2020-12-04 DIAGNOSIS — F4329 Adjustment disorder with other symptoms: Secondary | ICD-10-CM | POA: Diagnosis not present

## 2020-12-08 DIAGNOSIS — Z48815 Encounter for surgical aftercare following surgery on the digestive system: Secondary | ICD-10-CM | POA: Diagnosis not present

## 2020-12-08 DIAGNOSIS — Z9181 History of falling: Secondary | ICD-10-CM | POA: Diagnosis not present

## 2020-12-08 DIAGNOSIS — F4329 Adjustment disorder with other symptoms: Secondary | ICD-10-CM | POA: Diagnosis not present

## 2020-12-08 DIAGNOSIS — A419 Sepsis, unspecified organism: Secondary | ICD-10-CM | POA: Diagnosis not present

## 2020-12-08 DIAGNOSIS — I10 Essential (primary) hypertension: Secondary | ICD-10-CM | POA: Diagnosis not present

## 2020-12-10 DIAGNOSIS — I10 Essential (primary) hypertension: Secondary | ICD-10-CM | POA: Diagnosis not present

## 2020-12-10 DIAGNOSIS — F4329 Adjustment disorder with other symptoms: Secondary | ICD-10-CM | POA: Diagnosis not present

## 2020-12-10 DIAGNOSIS — Z9181 History of falling: Secondary | ICD-10-CM | POA: Diagnosis not present

## 2020-12-10 DIAGNOSIS — Z48815 Encounter for surgical aftercare following surgery on the digestive system: Secondary | ICD-10-CM | POA: Diagnosis not present

## 2020-12-10 DIAGNOSIS — A419 Sepsis, unspecified organism: Secondary | ICD-10-CM | POA: Diagnosis not present

## 2020-12-14 DIAGNOSIS — R2681 Unsteadiness on feet: Secondary | ICD-10-CM | POA: Diagnosis not present

## 2020-12-14 DIAGNOSIS — Z48815 Encounter for surgical aftercare following surgery on the digestive system: Secondary | ICD-10-CM | POA: Diagnosis not present

## 2020-12-14 DIAGNOSIS — H353220 Exudative age-related macular degeneration, left eye, stage unspecified: Secondary | ICD-10-CM | POA: Diagnosis not present

## 2020-12-14 DIAGNOSIS — I10 Essential (primary) hypertension: Secondary | ICD-10-CM | POA: Diagnosis not present

## 2020-12-14 DIAGNOSIS — B3749 Other urogenital candidiasis: Secondary | ICD-10-CM | POA: Diagnosis not present

## 2020-12-14 DIAGNOSIS — Z9181 History of falling: Secondary | ICD-10-CM | POA: Diagnosis not present

## 2020-12-14 DIAGNOSIS — I739 Peripheral vascular disease, unspecified: Secondary | ICD-10-CM | POA: Diagnosis not present

## 2020-12-14 DIAGNOSIS — A419 Sepsis, unspecified organism: Secondary | ICD-10-CM | POA: Diagnosis not present

## 2020-12-14 DIAGNOSIS — F4329 Adjustment disorder with other symptoms: Secondary | ICD-10-CM | POA: Diagnosis not present

## 2020-12-16 ENCOUNTER — Encounter: Payer: Self-pay | Admitting: Internal Medicine

## 2020-12-16 ENCOUNTER — Ambulatory Visit (INDEPENDENT_AMBULATORY_CARE_PROVIDER_SITE_OTHER): Payer: Medicare HMO | Admitting: Internal Medicine

## 2020-12-16 ENCOUNTER — Other Ambulatory Visit: Payer: Self-pay

## 2020-12-16 VITALS — BP 128/76 | HR 98 | Temp 97.7°F | Ht 68.0 in | Wt 245.0 lb

## 2020-12-16 DIAGNOSIS — R5381 Other malaise: Secondary | ICD-10-CM | POA: Diagnosis not present

## 2020-12-16 DIAGNOSIS — F39 Unspecified mood [affective] disorder: Secondary | ICD-10-CM | POA: Diagnosis not present

## 2020-12-16 DIAGNOSIS — K8050 Calculus of bile duct without cholangitis or cholecystitis without obstruction: Secondary | ICD-10-CM | POA: Diagnosis not present

## 2020-12-16 DIAGNOSIS — H353220 Exudative age-related macular degeneration, left eye, stage unspecified: Secondary | ICD-10-CM | POA: Diagnosis not present

## 2020-12-16 DIAGNOSIS — I10 Essential (primary) hypertension: Secondary | ICD-10-CM | POA: Diagnosis not present

## 2020-12-16 NOTE — Assessment & Plan Note (Signed)
Remains unhappy about circumstances Did discuss issues I don't think he is likely to be helped by medication

## 2020-12-16 NOTE — Assessment & Plan Note (Signed)
BMI down to 37 with recent weight loss Has HTN, PAD, limited mobility, etc He is trying to be careful with eating

## 2020-12-16 NOTE — Assessment & Plan Note (Signed)
BP Readings from Last 3 Encounters:  12/16/20 128/76  11/19/20 140/79  10/08/20 (!) 145/85   Doing okay on HCTZ/triamterene

## 2020-12-16 NOTE — Assessment & Plan Note (Signed)
Resolved with the cholecystectomy and ERCP Has recovered from GI standpoint

## 2020-12-16 NOTE — Assessment & Plan Note (Signed)
Continues to get left eye injections

## 2020-12-16 NOTE — Assessment & Plan Note (Signed)
Limited functional status Is able to live with assistance--though unhappy with that

## 2020-12-16 NOTE — Progress Notes (Signed)
Subjective:    Patient ID: Brad Reed, male    DOB: June 04, 1934, 86 y.o.   MRN: [id number]  HPI Here for follow up after hospitalization and rehab stay This visit occurred during the SARS-CoV-2 public health emergency.  Safety protocols were in place, including screening questions prior to the visit, additional usage of staff PPE, and extensive cleaning of exam room while observing appropriate contact time as indicated for disinfecting solutions.   Reviewed hospitalization Admitted to [facility] with sepsis and transaminitis Related to cholecystitis, etc Had ERCP---then had cholecystectomy Prolonged recovery so went to [facility] 7/7 Drain removed since then  Home to his place with sister at [facility] 3 days ago Has home PT and OT now Able to walk with 2 wheeled walker Is able to shower and dress himself Using bathroom--- occasional urinary incontinence when trying to get out of bed in time (wears pad)  No abdominal pain No N/V Bowels are moving fine---daily  Current Outpatient Medications on File Prior to Visit  Medication Sig Dispense Refill   Multiple Vitamin (MULTIVITAMIN) tablet Take 1 tablet by mouth daily.     multivitamin-lutein (OCUVITE-LUTEIN) CAPS capsule Take 1 capsule by mouth daily.     thiamine 100 MG tablet Take 1 tablet (100 mg total) by mouth daily.     triamterene-hydrochlorothiazide (DYAZIDE) 37.5-25 MG capsule Take 1 each (1 capsule total) by mouth daily. 90 capsule 3   No current facility-administered medications on file prior to visit.    No Active Allergies  Past Medical History:  Diagnosis Date   Angiodysplasia 02/13/2006   Arthritis    knees   Benign prostatic hypertrophy    Colon polyps    Diverticulosis of colon    Hypertension    Macular degeneration, wet (HCC)    Dr Chyrl Civatte   Obesity    Peripheral neuropathy    feet   Sleep apnea    DX'd when younger. declined CPAP.    Past Surgical History:  Procedure Laterality Date    APPENDECTOMY     CATARACT EXTRACTION W/PHACO Left 03/05/2018   Procedure: CATARACT EXTRACTION PHACO AND INTRAOCULAR LENS PLACEMENT (IOC) LEFT;  Surgeon: Nevada Crane, MD;  Location: [facility];  Service: Ophthalmology;  Laterality: Left;  sleep apnea   CATARACT EXTRACTION W/PHACO Right 03/19/2018   Procedure: CATARACT EXTRACTION PHACO AND INTRAOCULAR LENS PLACEMENT (IOC);  Surgeon: Nevada Crane, MD;  Location: [facility];  Service: Ophthalmology;  Laterality: Right;   ERCP N/A 11/10/2020   Procedure: ENDOSCOPIC RETROGRADE CHOLANGIOPANCREATOGRAPHY (ERCP);  Surgeon: Midge Minium, MD;  Location: [facility];  Service: Endoscopy;  Laterality: N/A;   IR GUIDED DRAIN W CATHETER PLACEMENT  07/03/2018   IR RADIOLOGIST EVAL & MGMT  07/18/2018   KNEE ARTHROSCOPY     BOTH KNEES   MASTOID DEBRIDEMENT     PILONIDAL CYST EXCISION  1960   TONSILLECTOMY      Family History  Problem Relation Age of Onset   Cancer Mother    Obesity Sister    Diabetes Sister    Diabetes Maternal Uncle     Social History   Socioeconomic History   Marital status: Widowed    Spouse name: Not on file   Number of children: 3   Years of education: Not on file   Highest education level: Not on file  Occupational History   Occupation: RETIRED    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[employer])  Tobacco Use   Smoking status:  Former    Types: Cigarettes    Quit date: 05/17/1983    Years since quitting: 37.6   Smokeless tobacco: Never  Vaping Use   Vaping Use: Never used  Substance and Sexual Activity   Alcohol use: Yes    Alcohol/week: 14.0 standard drinks    Types: 14 Shots of liquor per week    Comment: Drinks 2 cocktails /day   Drug use: No   Sexual activity: Not on file  Other Topics Concern   Not on file  Social History Narrative   Has living will    Son [name] is health care POA   Would accept resuscitation but no prolonged artificial life support   No tube feeds  if cognitively unaware   Social Determinants of Health   Financial Resource Strain: Not on file  Food Insecurity: Not on file  Transportation Needs: Not on file  Physical Activity: Not on file  Stress: Not on file  Social Connections: Not on file  Intimate Partner Violence: Not on file   Review of Systems No chest pain or SOB Appetite is okay--but "the food is awful" Has lost ~30# with this ordeal Remains bitter and unhappy about circumstances Still gets left eye injections every 10 weeks    Objective:   Physical Exam Cardiovascular:     Rate and Rhythm: Normal rate and regular rhythm.     Heart sounds: No murmur heard.   No gallop.  Pulmonary:     Effort: Pulmonary effort is normal.     Breath sounds: Normal breath sounds. No wheezing or rales.  Abdominal:     Palpations: Abdomen is soft.     Tenderness: There is no abdominal tenderness.  Musculoskeletal:     Cervical back: Neck supple.     Right lower leg: No edema.     Left lower leg: No edema.  Lymphadenopathy:     Cervical: No cervical adenopathy.  Neurological:     Mental Status: He is alert.           Assessment & Plan:

## 2020-12-17 DIAGNOSIS — F4329 Adjustment disorder with other symptoms: Secondary | ICD-10-CM | POA: Diagnosis not present

## 2020-12-17 DIAGNOSIS — F331 Major depressive disorder, recurrent, moderate: Secondary | ICD-10-CM | POA: Diagnosis not present

## 2020-12-17 DIAGNOSIS — Z9181 History of falling: Secondary | ICD-10-CM | POA: Diagnosis not present

## 2020-12-17 DIAGNOSIS — I10 Essential (primary) hypertension: Secondary | ICD-10-CM | POA: Diagnosis not present

## 2020-12-17 DIAGNOSIS — A419 Sepsis, unspecified organism: Secondary | ICD-10-CM | POA: Diagnosis not present

## 2020-12-17 DIAGNOSIS — Z48815 Encounter for surgical aftercare following surgery on the digestive system: Secondary | ICD-10-CM | POA: Diagnosis not present

## 2020-12-17 DIAGNOSIS — F411 Generalized anxiety disorder: Secondary | ICD-10-CM | POA: Diagnosis not present

## 2020-12-21 DIAGNOSIS — A419 Sepsis, unspecified organism: Secondary | ICD-10-CM | POA: Diagnosis not present

## 2020-12-21 DIAGNOSIS — F4329 Adjustment disorder with other symptoms: Secondary | ICD-10-CM | POA: Diagnosis not present

## 2020-12-21 DIAGNOSIS — Z48815 Encounter for surgical aftercare following surgery on the digestive system: Secondary | ICD-10-CM | POA: Diagnosis not present

## 2020-12-21 DIAGNOSIS — I10 Essential (primary) hypertension: Secondary | ICD-10-CM | POA: Diagnosis not present

## 2020-12-21 DIAGNOSIS — Z9181 History of falling: Secondary | ICD-10-CM | POA: Diagnosis not present

## 2020-12-23 ENCOUNTER — Telehealth: Payer: Self-pay | Admitting: *Deleted

## 2020-12-23 NOTE — Telephone Encounter (Signed)
Verbal orders given to Connie. 

## 2020-12-23 NOTE — Telephone Encounter (Signed)
Brad Reed OT with [facility] called requesting verbal orders for OT. Brad Reed is requesting once a week for 5 weeks and once every other week for 2 weeks.

## 2020-12-24 DIAGNOSIS — F411 Generalized anxiety disorder: Secondary | ICD-10-CM | POA: Diagnosis not present

## 2020-12-24 DIAGNOSIS — A419 Sepsis, unspecified organism: Secondary | ICD-10-CM | POA: Diagnosis not present

## 2020-12-24 DIAGNOSIS — I10 Essential (primary) hypertension: Secondary | ICD-10-CM | POA: Diagnosis not present

## 2020-12-24 DIAGNOSIS — Z48815 Encounter for surgical aftercare following surgery on the digestive system: Secondary | ICD-10-CM | POA: Diagnosis not present

## 2020-12-24 DIAGNOSIS — F4329 Adjustment disorder with other symptoms: Secondary | ICD-10-CM | POA: Diagnosis not present

## 2020-12-24 DIAGNOSIS — Z9181 History of falling: Secondary | ICD-10-CM | POA: Diagnosis not present

## 2020-12-24 DIAGNOSIS — F331 Major depressive disorder, recurrent, moderate: Secondary | ICD-10-CM | POA: Diagnosis not present

## 2020-12-28 DIAGNOSIS — A419 Sepsis, unspecified organism: Secondary | ICD-10-CM | POA: Diagnosis not present

## 2020-12-28 DIAGNOSIS — Z48815 Encounter for surgical aftercare following surgery on the digestive system: Secondary | ICD-10-CM | POA: Diagnosis not present

## 2020-12-28 DIAGNOSIS — Z9181 History of falling: Secondary | ICD-10-CM | POA: Diagnosis not present

## 2020-12-28 DIAGNOSIS — F4329 Adjustment disorder with other symptoms: Secondary | ICD-10-CM | POA: Diagnosis not present

## 2020-12-28 DIAGNOSIS — I10 Essential (primary) hypertension: Secondary | ICD-10-CM | POA: Diagnosis not present

## 2020-12-29 DIAGNOSIS — Z48815 Encounter for surgical aftercare following surgery on the digestive system: Secondary | ICD-10-CM | POA: Diagnosis not present

## 2020-12-29 DIAGNOSIS — A419 Sepsis, unspecified organism: Secondary | ICD-10-CM | POA: Diagnosis not present

## 2020-12-29 DIAGNOSIS — Z9181 History of falling: Secondary | ICD-10-CM | POA: Diagnosis not present

## 2020-12-29 DIAGNOSIS — F4329 Adjustment disorder with other symptoms: Secondary | ICD-10-CM | POA: Diagnosis not present

## 2020-12-29 DIAGNOSIS — I10 Essential (primary) hypertension: Secondary | ICD-10-CM | POA: Diagnosis not present

## 2020-12-30 DIAGNOSIS — F411 Generalized anxiety disorder: Secondary | ICD-10-CM | POA: Diagnosis not present

## 2020-12-30 DIAGNOSIS — H353221 Exudative age-related macular degeneration, left eye, with active choroidal neovascularization: Secondary | ICD-10-CM | POA: Diagnosis not present

## 2020-12-30 DIAGNOSIS — F331 Major depressive disorder, recurrent, moderate: Secondary | ICD-10-CM | POA: Diagnosis not present

## 2020-12-31 DIAGNOSIS — Z48815 Encounter for surgical aftercare following surgery on the digestive system: Secondary | ICD-10-CM | POA: Diagnosis not present

## 2020-12-31 DIAGNOSIS — Z9181 History of falling: Secondary | ICD-10-CM | POA: Diagnosis not present

## 2020-12-31 DIAGNOSIS — I10 Essential (primary) hypertension: Secondary | ICD-10-CM | POA: Diagnosis not present

## 2020-12-31 DIAGNOSIS — A419 Sepsis, unspecified organism: Secondary | ICD-10-CM | POA: Diagnosis not present

## 2020-12-31 DIAGNOSIS — F4329 Adjustment disorder with other symptoms: Secondary | ICD-10-CM | POA: Diagnosis not present

## 2020-12-31 DIAGNOSIS — F331 Major depressive disorder, recurrent, moderate: Secondary | ICD-10-CM | POA: Diagnosis not present

## 2020-12-31 DIAGNOSIS — F411 Generalized anxiety disorder: Secondary | ICD-10-CM | POA: Diagnosis not present

## 2021-01-05 DIAGNOSIS — I10 Essential (primary) hypertension: Secondary | ICD-10-CM | POA: Diagnosis not present

## 2021-01-05 DIAGNOSIS — Z48815 Encounter for surgical aftercare following surgery on the digestive system: Secondary | ICD-10-CM | POA: Diagnosis not present

## 2021-01-05 DIAGNOSIS — F4329 Adjustment disorder with other symptoms: Secondary | ICD-10-CM | POA: Diagnosis not present

## 2021-01-05 DIAGNOSIS — A419 Sepsis, unspecified organism: Secondary | ICD-10-CM | POA: Diagnosis not present

## 2021-01-05 DIAGNOSIS — Z9181 History of falling: Secondary | ICD-10-CM | POA: Diagnosis not present

## 2021-01-06 DIAGNOSIS — F4329 Adjustment disorder with other symptoms: Secondary | ICD-10-CM | POA: Diagnosis not present

## 2021-01-06 DIAGNOSIS — Z48815 Encounter for surgical aftercare following surgery on the digestive system: Secondary | ICD-10-CM | POA: Diagnosis not present

## 2021-01-06 DIAGNOSIS — A419 Sepsis, unspecified organism: Secondary | ICD-10-CM | POA: Diagnosis not present

## 2021-01-06 DIAGNOSIS — Z9181 History of falling: Secondary | ICD-10-CM | POA: Diagnosis not present

## 2021-01-06 DIAGNOSIS — I10 Essential (primary) hypertension: Secondary | ICD-10-CM | POA: Diagnosis not present

## 2021-01-07 DIAGNOSIS — F32A Depression, unspecified: Secondary | ICD-10-CM | POA: Diagnosis not present

## 2021-01-07 DIAGNOSIS — A419 Sepsis, unspecified organism: Secondary | ICD-10-CM | POA: Diagnosis not present

## 2021-01-07 DIAGNOSIS — Z79899 Other long term (current) drug therapy: Secondary | ICD-10-CM | POA: Diagnosis not present

## 2021-01-07 DIAGNOSIS — I10 Essential (primary) hypertension: Secondary | ICD-10-CM | POA: Diagnosis not present

## 2021-01-07 DIAGNOSIS — F331 Major depressive disorder, recurrent, moderate: Secondary | ICD-10-CM | POA: Diagnosis not present

## 2021-01-07 DIAGNOSIS — Z9181 History of falling: Secondary | ICD-10-CM | POA: Diagnosis not present

## 2021-01-07 DIAGNOSIS — I739 Peripheral vascular disease, unspecified: Secondary | ICD-10-CM | POA: Diagnosis not present

## 2021-01-07 DIAGNOSIS — F4329 Adjustment disorder with other symptoms: Secondary | ICD-10-CM | POA: Diagnosis not present

## 2021-01-07 DIAGNOSIS — F411 Generalized anxiety disorder: Secondary | ICD-10-CM | POA: Diagnosis not present

## 2021-01-07 DIAGNOSIS — Z48815 Encounter for surgical aftercare following surgery on the digestive system: Secondary | ICD-10-CM | POA: Diagnosis not present

## 2021-01-11 DIAGNOSIS — B3749 Other urogenital candidiasis: Secondary | ICD-10-CM | POA: Diagnosis not present

## 2021-01-11 DIAGNOSIS — L509 Urticaria, unspecified: Secondary | ICD-10-CM | POA: Diagnosis not present

## 2021-01-11 DIAGNOSIS — R2681 Unsteadiness on feet: Secondary | ICD-10-CM | POA: Diagnosis not present

## 2021-01-11 DIAGNOSIS — I10 Essential (primary) hypertension: Secondary | ICD-10-CM | POA: Diagnosis not present

## 2021-01-11 DIAGNOSIS — F331 Major depressive disorder, recurrent, moderate: Secondary | ICD-10-CM | POA: Diagnosis not present

## 2021-01-12 DIAGNOSIS — Z9181 History of falling: Secondary | ICD-10-CM | POA: Diagnosis not present

## 2021-01-12 DIAGNOSIS — F4329 Adjustment disorder with other symptoms: Secondary | ICD-10-CM | POA: Diagnosis not present

## 2021-01-12 DIAGNOSIS — A419 Sepsis, unspecified organism: Secondary | ICD-10-CM | POA: Diagnosis not present

## 2021-01-12 DIAGNOSIS — Z48815 Encounter for surgical aftercare following surgery on the digestive system: Secondary | ICD-10-CM | POA: Diagnosis not present

## 2021-01-12 DIAGNOSIS — I10 Essential (primary) hypertension: Secondary | ICD-10-CM | POA: Diagnosis not present

## 2021-01-14 DIAGNOSIS — I10 Essential (primary) hypertension: Secondary | ICD-10-CM | POA: Diagnosis not present

## 2021-01-14 DIAGNOSIS — Z48815 Encounter for surgical aftercare following surgery on the digestive system: Secondary | ICD-10-CM | POA: Diagnosis not present

## 2021-01-14 DIAGNOSIS — Z9181 History of falling: Secondary | ICD-10-CM | POA: Diagnosis not present

## 2021-01-14 DIAGNOSIS — F411 Generalized anxiety disorder: Secondary | ICD-10-CM | POA: Diagnosis not present

## 2021-01-14 DIAGNOSIS — A419 Sepsis, unspecified organism: Secondary | ICD-10-CM | POA: Diagnosis not present

## 2021-01-14 DIAGNOSIS — F4329 Adjustment disorder with other symptoms: Secondary | ICD-10-CM | POA: Diagnosis not present

## 2021-01-14 DIAGNOSIS — F331 Major depressive disorder, recurrent, moderate: Secondary | ICD-10-CM | POA: Diagnosis not present

## 2021-01-20 DIAGNOSIS — A419 Sepsis, unspecified organism: Secondary | ICD-10-CM | POA: Diagnosis not present

## 2021-01-20 DIAGNOSIS — F4329 Adjustment disorder with other symptoms: Secondary | ICD-10-CM | POA: Diagnosis not present

## 2021-01-20 DIAGNOSIS — Z9181 History of falling: Secondary | ICD-10-CM | POA: Diagnosis not present

## 2021-01-20 DIAGNOSIS — I10 Essential (primary) hypertension: Secondary | ICD-10-CM | POA: Diagnosis not present

## 2021-01-20 DIAGNOSIS — Z48815 Encounter for surgical aftercare following surgery on the digestive system: Secondary | ICD-10-CM | POA: Diagnosis not present

## 2021-01-21 DIAGNOSIS — F411 Generalized anxiety disorder: Secondary | ICD-10-CM | POA: Diagnosis not present

## 2021-01-21 DIAGNOSIS — F331 Major depressive disorder, recurrent, moderate: Secondary | ICD-10-CM | POA: Diagnosis not present

## 2021-01-27 DIAGNOSIS — I10 Essential (primary) hypertension: Secondary | ICD-10-CM | POA: Diagnosis not present

## 2021-01-27 DIAGNOSIS — A419 Sepsis, unspecified organism: Secondary | ICD-10-CM | POA: Diagnosis not present

## 2021-01-27 DIAGNOSIS — Z9181 History of falling: Secondary | ICD-10-CM | POA: Diagnosis not present

## 2021-01-27 DIAGNOSIS — F4329 Adjustment disorder with other symptoms: Secondary | ICD-10-CM | POA: Diagnosis not present

## 2021-01-27 DIAGNOSIS — Z48815 Encounter for surgical aftercare following surgery on the digestive system: Secondary | ICD-10-CM | POA: Diagnosis not present

## 2021-01-28 DIAGNOSIS — F331 Major depressive disorder, recurrent, moderate: Secondary | ICD-10-CM | POA: Diagnosis not present

## 2021-01-28 DIAGNOSIS — F411 Generalized anxiety disorder: Secondary | ICD-10-CM | POA: Diagnosis not present

## 2021-01-29 ENCOUNTER — Telehealth: Payer: Self-pay | Admitting: Internal Medicine

## 2021-01-29 NOTE — Telephone Encounter (Signed)
Rocio from [place] called requesting a call back stated Pt need a pare to pare done by Monday . #[phone] option 1

## 2021-01-29 NOTE — Telephone Encounter (Signed)
Left a message for [employer] to call back with more information as to what is needing a Peer to Peer review and that Dr Alphonsus Sias will not be back in the office until 1230 pm on Monday (9/19). We have not done a referral for him in awhile.

## 2021-02-02 DIAGNOSIS — I10 Essential (primary) hypertension: Secondary | ICD-10-CM | POA: Diagnosis not present

## 2021-02-02 DIAGNOSIS — Z48815 Encounter for surgical aftercare following surgery on the digestive system: Secondary | ICD-10-CM | POA: Diagnosis not present

## 2021-02-02 DIAGNOSIS — A419 Sepsis, unspecified organism: Secondary | ICD-10-CM | POA: Diagnosis not present

## 2021-02-02 DIAGNOSIS — Z9181 History of falling: Secondary | ICD-10-CM | POA: Diagnosis not present

## 2021-02-02 DIAGNOSIS — F4329 Adjustment disorder with other symptoms: Secondary | ICD-10-CM | POA: Diagnosis not present

## 2021-02-03 DIAGNOSIS — A419 Sepsis, unspecified organism: Secondary | ICD-10-CM | POA: Diagnosis not present

## 2021-02-03 DIAGNOSIS — I10 Essential (primary) hypertension: Secondary | ICD-10-CM | POA: Diagnosis not present

## 2021-02-03 DIAGNOSIS — Z9181 History of falling: Secondary | ICD-10-CM | POA: Diagnosis not present

## 2021-02-03 DIAGNOSIS — Z48815 Encounter for surgical aftercare following surgery on the digestive system: Secondary | ICD-10-CM | POA: Diagnosis not present

## 2021-02-03 DIAGNOSIS — F4329 Adjustment disorder with other symptoms: Secondary | ICD-10-CM | POA: Diagnosis not present

## 2021-02-04 DIAGNOSIS — F411 Generalized anxiety disorder: Secondary | ICD-10-CM | POA: Diagnosis not present

## 2021-02-04 DIAGNOSIS — F331 Major depressive disorder, recurrent, moderate: Secondary | ICD-10-CM | POA: Diagnosis not present

## 2021-03-08 DIAGNOSIS — R609 Edema, unspecified: Secondary | ICD-10-CM | POA: Diagnosis not present

## 2021-03-08 DIAGNOSIS — F331 Major depressive disorder, recurrent, moderate: Secondary | ICD-10-CM | POA: Diagnosis not present

## 2021-03-08 DIAGNOSIS — I10 Essential (primary) hypertension: Secondary | ICD-10-CM | POA: Diagnosis not present

## 2021-03-08 DIAGNOSIS — R2681 Unsteadiness on feet: Secondary | ICD-10-CM | POA: Diagnosis not present

## 2021-03-08 DIAGNOSIS — I739 Peripheral vascular disease, unspecified: Secondary | ICD-10-CM | POA: Diagnosis not present

## 2021-03-22 DIAGNOSIS — R489 Unspecified symbolic dysfunctions: Secondary | ICD-10-CM | POA: Diagnosis not present

## 2021-03-22 DIAGNOSIS — M6281 Muscle weakness (generalized): Secondary | ICD-10-CM | POA: Diagnosis not present

## 2021-03-24 DIAGNOSIS — H353221 Exudative age-related macular degeneration, left eye, with active choroidal neovascularization: Secondary | ICD-10-CM | POA: Diagnosis not present

## 2021-03-25 ENCOUNTER — Telehealth: Payer: Self-pay | Admitting: Internal Medicine

## 2021-03-25 DIAGNOSIS — M6281 Muscle weakness (generalized): Secondary | ICD-10-CM | POA: Diagnosis not present

## 2021-03-25 DIAGNOSIS — R489 Unspecified symbolic dysfunctions: Secondary | ICD-10-CM | POA: Diagnosis not present

## 2021-03-25 NOTE — Telephone Encounter (Signed)
I tried to call pt back on the number we have in the system and it is no longer in service. Did he say which pharmacy: [facility]?

## 2021-03-25 NOTE — Telephone Encounter (Signed)
Tried to return the phone call with the number he called from which is [phone]; Pt did not answer and the voicemail box was full   Previously the medication has gone to [facility]

## 2021-03-25 NOTE — Telephone Encounter (Signed)
Patient has called the office stating he needs a medication refill for his high BP medication. He had a hard time getting it from his pharmacy he said.

## 2021-03-29 NOTE — Telephone Encounter (Signed)
Received word pt has passed.

## 2021-03-29 NOTE — Telephone Encounter (Signed)
Tried calling the number in the message, again. No pt identifiers on the VM. VM was full so I could not leave a message.

## 2021-04-12 ENCOUNTER — Ambulatory Visit: Payer: Medicare HMO | Admitting: Internal Medicine

## 2021-04-15 DEATH — deceased

## 2021-04-22 ENCOUNTER — Ambulatory Visit: Payer: Medicare HMO | Admitting: Internal Medicine

## 2022-06-10 IMAGING — CT CT ABD-PELV W/ CM
2 of 5 series · 17 of 46 positions shown, 19 images · IV contrast (omnipaque)
Comparison: None.

CLINICAL DATA: Abdominal pain

EXAM:
CT ABDOMEN AND PELVIS WITH CONTRAST
TECHNIQUE: Multidetector CT imaging of the abdomen and pelvis was performed
using the standard protocol following bolus administration of
intravenous contrast.
CONTRAST:  100mL OMNIPAQUE IOHEXOL 350 MG/ML SOLN

[Series 2: axial st · axial · 0.98mm/px · z∈[-881,-421]mm · 14 of 104 slices shown, 16 images]
[im 6/104  soft-tissue]
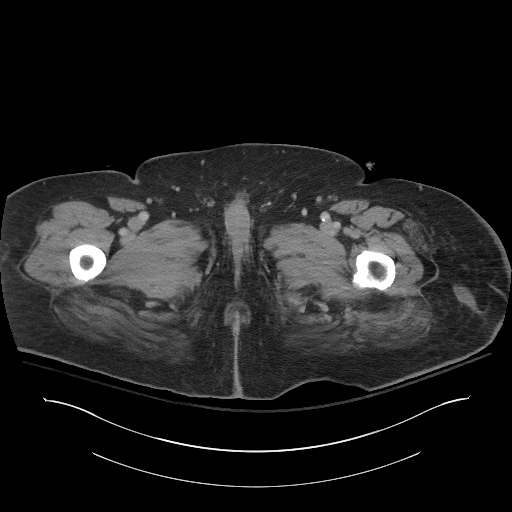
[im 6/104  bone]
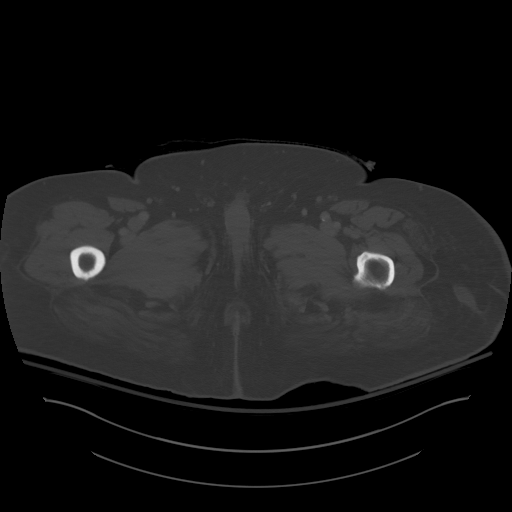
[im 11/104  soft-tissue]
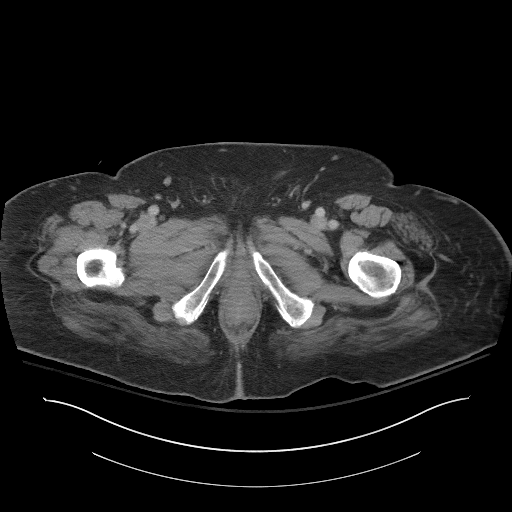
[im 22/104  soft-tissue]
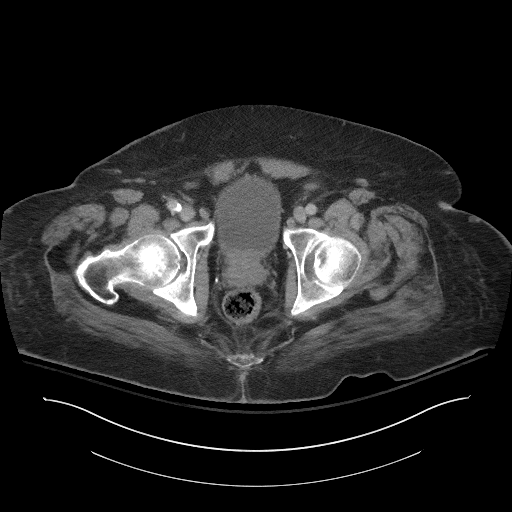
[im 28/104  soft-tissue]
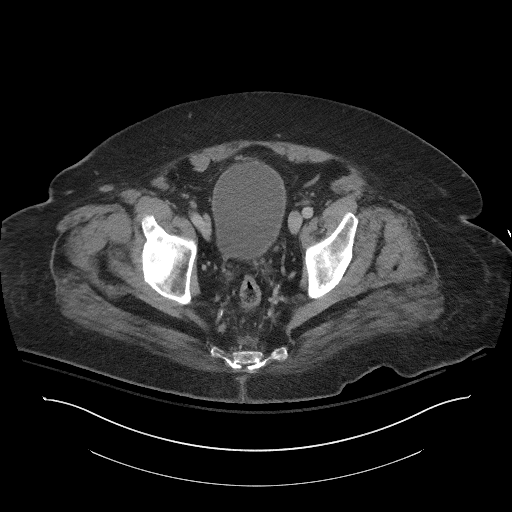
[im 33/104  soft-tissue]
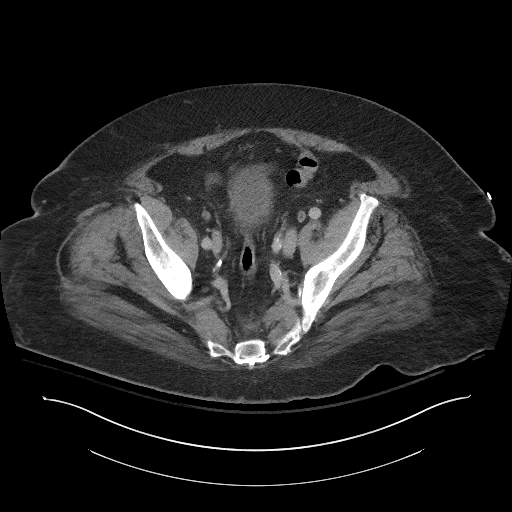
[im 44/104  soft-tissue]
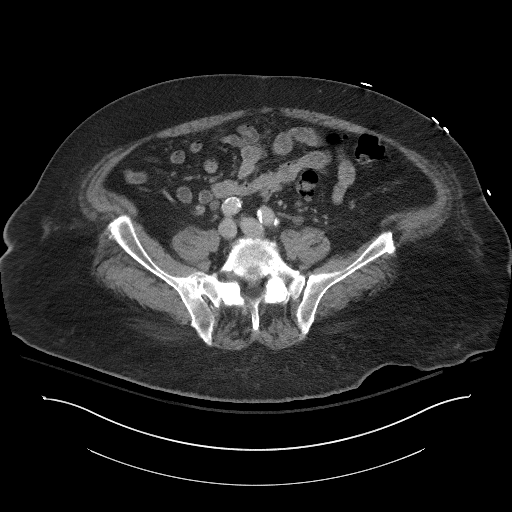
[im 49/104  soft-tissue]
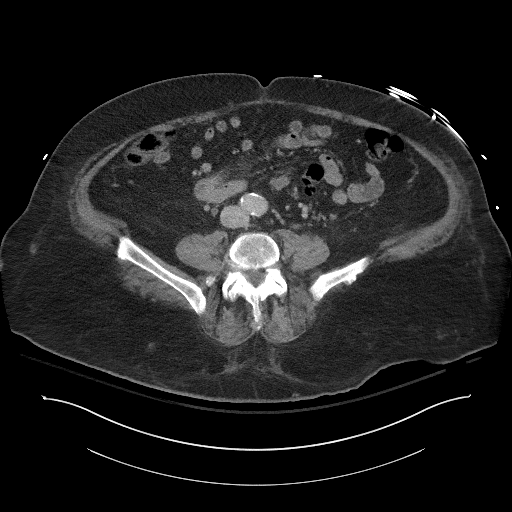
[im 55/104  soft-tissue]
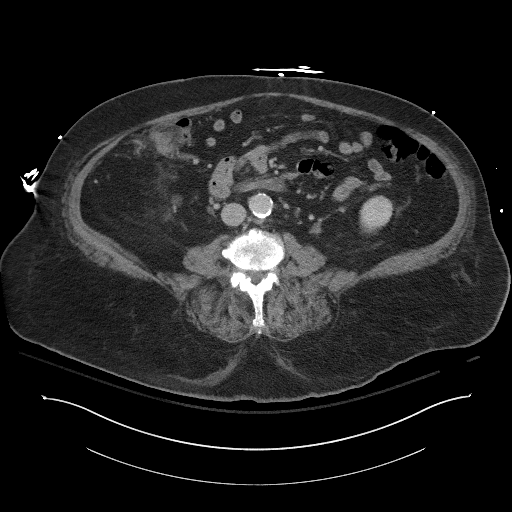
[im 60/104  soft-tissue]
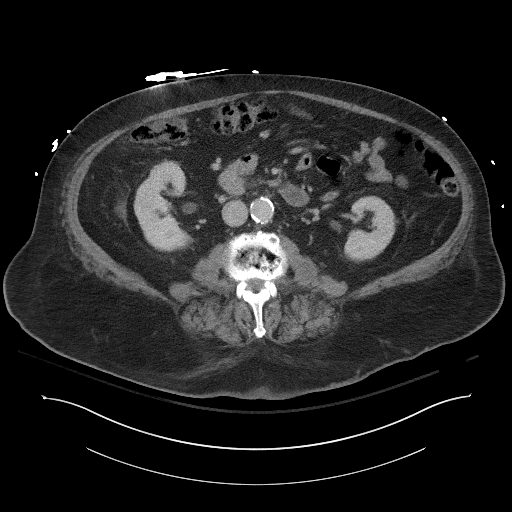
[im 60/104  bone]
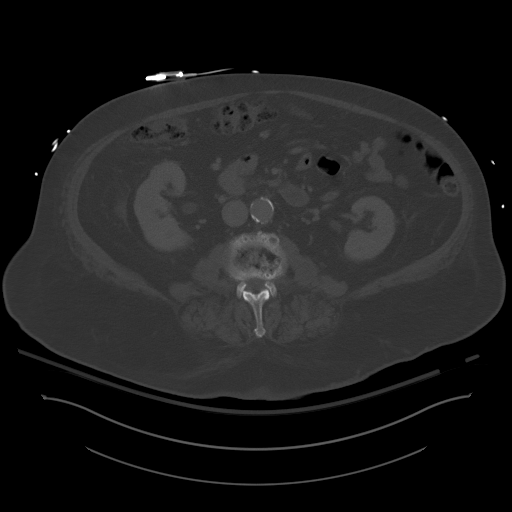
[im 71/104  soft-tissue]
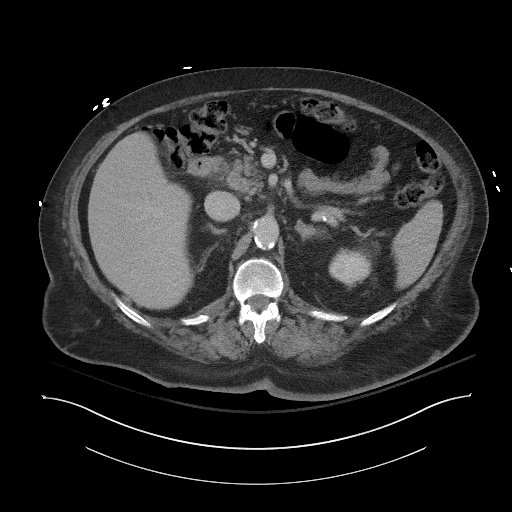
[im 76/104  soft-tissue]
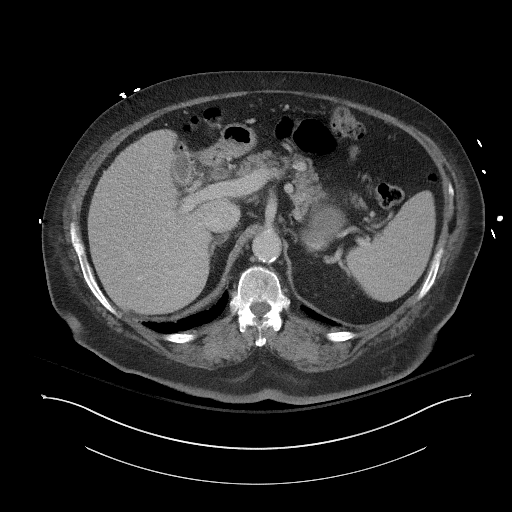
[im 82/104  soft-tissue]
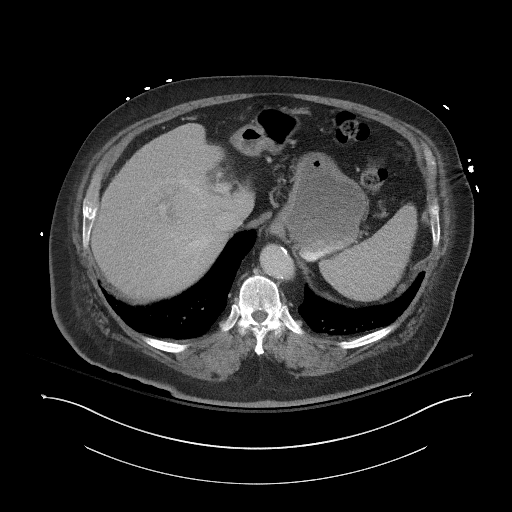
[im 93/104  soft-tissue]
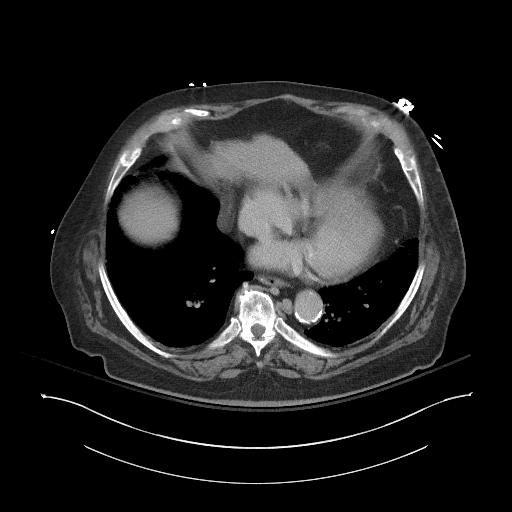
[im 98/104  soft-tissue]
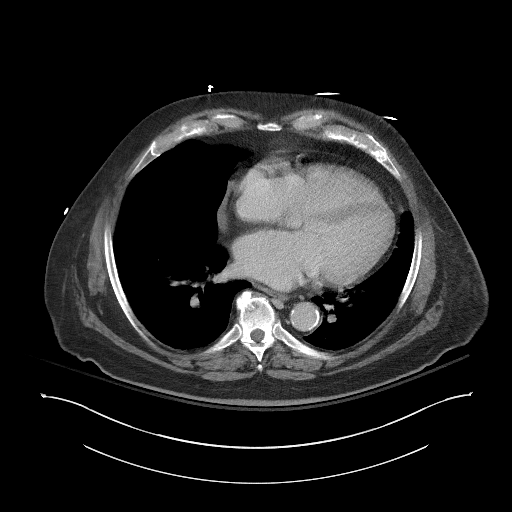

[Series 5: coronal st · coronal · 1.01mm/px · 3 of 114 slices shown]
[im 38/114  soft-tissue]
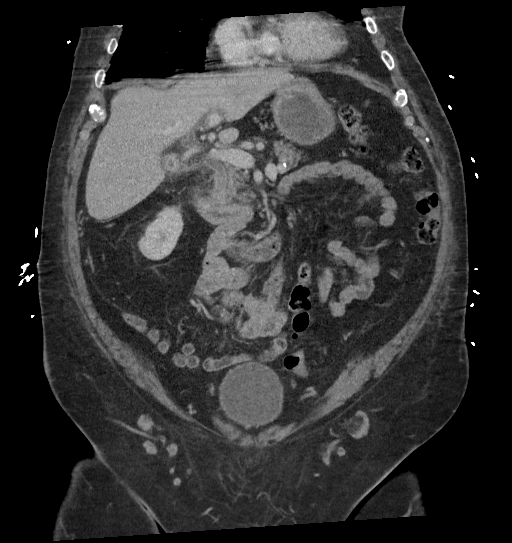
[im 51/114  soft-tissue]
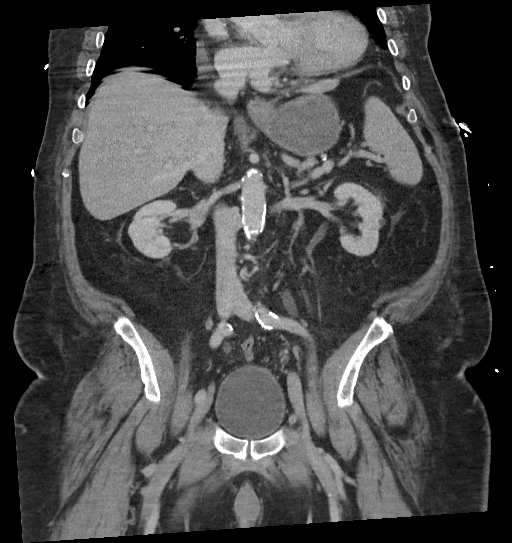
[im 63/114  soft-tissue]
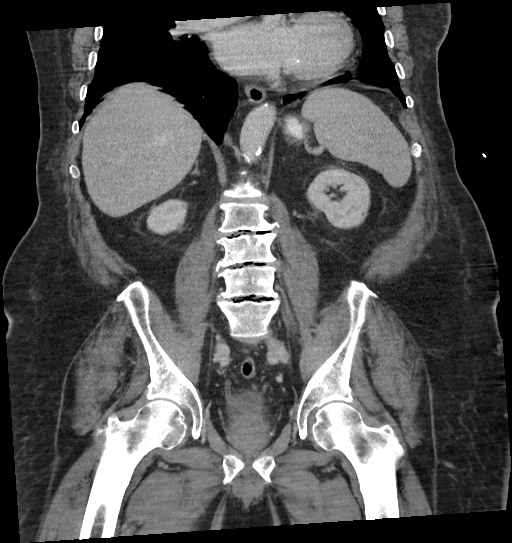

[17 of 46 positions shown; findings below may reference images not displayed]

FINDINGS: Lower chest: No acute abnormality.  Aortic atherosclerosis.

Hepatobiliary: No focal hepatic abnormality. Gallbladder
unremarkable.

Pancreas: No focal abnormality or ductal dilatation.

Spleen: No focal abnormality.  Normal size.

Adrenals/Urinary Tract: No adrenal abnormality. No focal renal
abnormality. No stones or hydronephrosis. Urinary bladder is
unremarkable.

Stomach/Bowel: Stomach, large and small bowel grossly unremarkable.

Vascular/Lymphatic: Aortic atherosclerosis. No evidence of aneurysm
or adenopathy.

Reproductive: Mild prostate enlargement with calcifications.

Other: No free fluid or free air.

Musculoskeletal: No acute bony abnormality.
IMPRESSION: No acute findings in the abdomen or pelvis.

Aortic atherosclerosis.

Prostate enlargement.
